# Patient Record
Sex: Male | Born: 1965 | Race: Black or African American | Hispanic: No | Marital: Single | Smoking: Never smoker
Health system: Southern US, Community
[De-identification: ages and names within clinical notes are randomized; demographics above are authoritative.]

## PROBLEM LIST (undated history)

## (undated) DIAGNOSIS — N200 Calculus of kidney: Secondary | ICD-10-CM

## (undated) DIAGNOSIS — Z8051 Family history of malignant neoplasm of kidney: Secondary | ICD-10-CM

## (undated) DIAGNOSIS — Z87442 Personal history of urinary calculi: Secondary | ICD-10-CM

## (undated) DIAGNOSIS — J9600 Acute respiratory failure, unspecified whether with hypoxia or hypercapnia: Secondary | ICD-10-CM

## (undated) DIAGNOSIS — E871 Hypo-osmolality and hyponatremia: Secondary | ICD-10-CM

## (undated) DIAGNOSIS — N179 Acute kidney failure, unspecified: Secondary | ICD-10-CM

## (undated) DIAGNOSIS — C801 Malignant (primary) neoplasm, unspecified: Secondary | ICD-10-CM

## (undated) DIAGNOSIS — I10 Essential (primary) hypertension: Secondary | ICD-10-CM

## (undated) DIAGNOSIS — D869 Sarcoidosis, unspecified: Secondary | ICD-10-CM

## (undated) DIAGNOSIS — D649 Anemia, unspecified: Secondary | ICD-10-CM

## (undated) DIAGNOSIS — Z8042 Family history of malignant neoplasm of prostate: Secondary | ICD-10-CM

## (undated) DIAGNOSIS — J189 Pneumonia, unspecified organism: Secondary | ICD-10-CM

## (undated) DIAGNOSIS — Z8041 Family history of malignant neoplasm of ovary: Secondary | ICD-10-CM

## (undated) DIAGNOSIS — Z800 Family history of malignant neoplasm of digestive organs: Secondary | ICD-10-CM

## (undated) HISTORY — PX: OTHER SURGICAL HISTORY: SHX169

## (undated) HISTORY — DX: Family history of malignant neoplasm of ovary: Z80.41

## (undated) HISTORY — PX: HEAD & NECK SKIN LESION EXCISIONAL BIOPSY: SUR472

## (undated) HISTORY — PX: KNEE SURGERY: SHX244

## (undated) HISTORY — DX: Family history of malignant neoplasm of digestive organs: Z80.0

## (undated) HISTORY — DX: Family history of malignant neoplasm of kidney: Z80.51

## (undated) HISTORY — DX: Family history of malignant neoplasm of prostate: Z80.42

---

## 2003-12-11 ENCOUNTER — Encounter: Admission: RE | Admit: 2003-12-11 | Discharge: 2003-12-11 | Payer: Self-pay | Admitting: Internal Medicine

## 2005-07-23 ENCOUNTER — Ambulatory Visit (HOSPITAL_COMMUNITY): Admission: RE | Admit: 2005-07-23 | Discharge: 2005-07-23 | Payer: Self-pay | Admitting: Gastroenterology

## 2005-07-23 ENCOUNTER — Encounter (INDEPENDENT_AMBULATORY_CARE_PROVIDER_SITE_OTHER): Payer: Self-pay | Admitting: *Deleted

## 2007-01-11 ENCOUNTER — Encounter: Admission: RE | Admit: 2007-01-11 | Discharge: 2007-01-11 | Payer: Self-pay | Admitting: Internal Medicine

## 2010-12-04 NOTE — Op Note (Signed)
NAME:  Mike Cruz, Mike Cruz               ACCOUNT NO.:  [id number]   MEDICAL RECORD NO.:  [id number]          PATIENT TYPE:  AMB   LOCATION:  ENDO                         FACILITY:  [facility]   PHYSICIAN:  Bernette Redbird, M.D.   DATE OF BIRTH:  07-15-1966   DATE OF PROCEDURE:  07/23/2005  DATE OF DISCHARGE:                                 OPERATIVE REPORT   PROCEDURE:  Colonoscopy with biopsy.   INDICATIONS:  A 45 year old African-American male with family history of  colon cancer in a second-degree relative (maternal grandmother, probably in  her mid to late 32s).   FINDINGS:  Prominent ileocecal valve, probably just redundant prolapsed  ileal mucosa, biopsied.   PROCEDURE:  The risks of the procedure were reviewed with the patient as  well as the principles of the purpose behind screening, and he provided  written consent.  Sedation was fentanyl 100 mcg and Versed 10 mg IV, without  arrhythmias or desaturation.  I did not really get an exam of the prostate  gland.  The Olympus adult video colonoscope was advanced quite easily around  the colon, turning the patient into the supine position to facilitate  passage.  With the help of as some external abdominal compression, the cecum  was reached and the ileum was entered for a short distance.  The ileum  looked normal, with some degree of lymphoid hyperplasia.   There was prominent mucosa on the ileocecal valve, probably just  representing some prolapsed ileal mucosa, but it was biopsied to make sure  that it was not polypoid tissue arising from the colonic mucosa.  Pullback  was then performed.  The quality of the prep was excellent, and it is felt  that all areas were well seen.   This was a normal examination.  No polyps, cancer, colitis, vascular  malformations or diverticulosis were noted.  I could not retroflex in the  rectum due to a small rectal ampulla, but antegrade viewing and reinspection  of the rectum were unremarkable and  pullout through the anal canal was  unrevealing.   The patient tolerated the procedure well and there no apparent  complications.   IMPRESSION:  Essentially normal colonoscopy apart from redundant mucosa at  the ileocecal valve orifice, probably  a normal variant.   PLAN:  1.  Await pathology results.  2.  Follow-up colonoscopy in five years in view of the family history and      the patient's ethnic status.           ______________________________  Bernette Redbird, M.D.     RB/MEDQ  D:  07/23/2005  T:  07/23/2005  Job:  [id number]   cc:   Candyce Churn, M.D.  Fax: [phone]

## 2013-05-11 ENCOUNTER — Emergency Department: Payer: Self-pay | Admitting: Emergency Medicine

## 2013-05-11 LAB — URINALYSIS, COMPLETE
Bilirubin,UR: NEGATIVE
Glucose,UR: NEGATIVE mg/dL (ref 0–75)
Leukocyte Esterase: NEGATIVE
Specific Gravity: 1.02 (ref 1.003–1.030)

## 2013-07-28 ENCOUNTER — Emergency Department (HOSPITAL_COMMUNITY): Payer: BC Managed Care – PPO

## 2013-07-28 ENCOUNTER — Encounter (HOSPITAL_COMMUNITY): Payer: Self-pay | Admitting: Emergency Medicine

## 2013-07-28 ENCOUNTER — Emergency Department (HOSPITAL_COMMUNITY)
Admission: EM | Admit: 2013-07-28 | Discharge: 2013-07-28 | Disposition: A | Discharge status: Home / Self Care | Payer: BC Managed Care – PPO | Attending: Emergency Medicine | Admitting: Emergency Medicine

## 2013-07-28 DIAGNOSIS — N289 Disorder of kidney and ureter, unspecified: Secondary | ICD-10-CM

## 2013-07-28 DIAGNOSIS — R319 Hematuria, unspecified: Secondary | ICD-10-CM

## 2013-07-28 DIAGNOSIS — D649 Anemia, unspecified: Secondary | ICD-10-CM | POA: Insufficient documentation

## 2013-07-28 DIAGNOSIS — R61 Generalized hyperhidrosis: Secondary | ICD-10-CM | POA: Insufficient documentation

## 2013-07-28 DIAGNOSIS — R42 Dizziness and giddiness: Secondary | ICD-10-CM | POA: Insufficient documentation

## 2013-07-28 DIAGNOSIS — N200 Calculus of kidney: Secondary | ICD-10-CM

## 2013-07-28 DIAGNOSIS — R55 Syncope and collapse: Secondary | ICD-10-CM | POA: Insufficient documentation

## 2013-07-28 HISTORY — DX: Calculus of kidney: N20.0

## 2013-07-28 LAB — CBC WITH DIFFERENTIAL/PLATELET
BASOS ABS: 0 10*3/uL (ref 0.0–0.1)
Basophils Relative: 0 % (ref 0–1)
EOS ABS: 0.1 10*3/uL (ref 0.0–0.7)
EOS PCT: 2 % (ref 0–5)
HEMATOCRIT: 35 % — AB (ref 39.0–52.0)
Hemoglobin: 11.7 g/dL — ABNORMAL LOW (ref 13.0–17.0)
LYMPHS PCT: 22 % (ref 12–46)
Lymphs Abs: 1.5 10*3/uL (ref 0.7–4.0)
MCH: 28.3 pg (ref 26.0–34.0)
MCHC: 33.4 g/dL (ref 30.0–36.0)
MCV: 84.7 fL (ref 78.0–100.0)
MONO ABS: 0.7 10*3/uL (ref 0.1–1.0)
Monocytes Relative: 10 % (ref 3–12)
NEUTROS ABS: 4.4 10*3/uL (ref 1.7–7.7)
Neutrophils Relative %: 66 % (ref 43–77)
Platelets: 148 10*3/uL — ABNORMAL LOW (ref 150–400)
RBC: 4.13 MIL/uL — ABNORMAL LOW (ref 4.22–5.81)
RDW: 13.1 % (ref 11.5–15.5)
WBC: 6.7 10*3/uL (ref 4.0–10.5)

## 2013-07-28 LAB — URINE MICROSCOPIC-ADD ON

## 2013-07-28 LAB — COMPREHENSIVE METABOLIC PANEL
ALBUMIN: 3.8 g/dL (ref 3.5–5.2)
ALK PHOS: 65 U/L (ref 39–117)
ALT: 38 U/L (ref 0–53)
AST: 38 U/L — AB (ref 0–37)
BUN: 17 mg/dL (ref 6–23)
CALCIUM: 9.9 mg/dL (ref 8.4–10.5)
CO2: 27 meq/L (ref 19–32)
CREATININE: 1.74 mg/dL — AB (ref 0.50–1.35)
Chloride: 104 mEq/L (ref 96–112)
GFR calc Af Amer: 51 mL/min — ABNORMAL LOW (ref >90)
GFR calc non Af Amer: 44 mL/min — ABNORMAL LOW (ref >90)
GLUCOSE: 118 mg/dL — AB (ref 70–99)
Potassium: 4.2 mEq/L (ref 3.7–5.3)
Sodium: 141 mEq/L (ref 137–147)
Total Bilirubin: 0.4 mg/dL (ref 0.3–1.2)
Total Protein: 7.4 g/dL (ref 6.0–8.3)

## 2013-07-28 LAB — URINALYSIS, ROUTINE W REFLEX MICROSCOPIC
Bilirubin Urine: NEGATIVE
GLUCOSE, UA: NEGATIVE mg/dL
KETONES UR: NEGATIVE mg/dL
Leukocytes, UA: NEGATIVE
Nitrite: NEGATIVE
PH: 5 (ref 5.0–8.0)
Protein, ur: 30 mg/dL — AB
Specific Gravity, Urine: 1.025 (ref 1.005–1.030)
Urobilinogen, UA: 0.2 mg/dL (ref 0.0–1.0)

## 2013-07-28 LAB — POCT I-STAT TROPONIN I
TROPONIN I, POC: 0.01 ng/mL (ref 0.00–0.08)
TROPONIN I, POC: 0 ng/mL (ref 0.00–0.08)

## 2013-07-28 LAB — CG4 I-STAT (LACTIC ACID): Lactic Acid, Venous: 0.99 mmol/L (ref 0.5–2.2)

## 2013-07-28 MED ORDER — OXYCODONE-ACETAMINOPHEN 5-325 MG PO TABS: 1.0000 | ORAL_TABLET | ORAL | Status: DC | PRN

## 2013-07-28 MED ORDER — SODIUM CHLORIDE 0.9 % IV SOLN
1000.0000 mL | Freq: Once | INTRAVENOUS | Status: AC
  Administered 2013-07-28: 1000 mL via INTRAVENOUS

## 2013-07-28 MED ORDER — SODIUM CHLORIDE 0.9 % IV SOLN
1000.0000 mL | INTRAVENOUS | Status: DC
  Administered 2013-07-28: 1000 mL via INTRAVENOUS

## 2013-07-28 NOTE — ED Provider Notes (Signed)
Reviewed vitals/labs Pt improved We discussed imaging and need for urology followup and also need for outpatient MRI by PCP due to lesion in backon CT imaging  Discussed need to hold BP meds until seen by PCP Pt feels comfortable for d/c home BP 121/69  Pulse 68  Temp(Src) 98.3 F (36.8 C) (Oral)  Resp 18  SpO2 100%   Sharyon Cable, MD 07/28/13 0945

## 2013-07-28 NOTE — Discharge Instructions (Signed)
PLEASE HAVE FOLLOWUP MRI OF YOUR BACK AS WE DISCUSSED AS AN OUTPATIENT TO FURTHER EVALUATE THE LESION WE DISCUSSED PLEASE SEE DR GATES NEXT WEEK AND HOLD YOUR BLOOD PRESSURE MEDS UNTIL SEE AT THAT TIME

## 2013-07-28 NOTE — ED Provider Notes (Signed)
CSN: [id number]     Arrival date & time 07/28/13  0325 History   First MD Initiated Contact with Patient 07/28/13 0448     Chief Complaint  Patient presents with  . Loss of Consciousness   (Consider location/radiation/quality/duration/timing/severity/associated sxs/prior Treatment) Patient is a 48 y.o. male presenting with syncope. The history is provided by the patient and the spouse.  Loss of Consciousness He woke up with an urge to urinate. When he got up to go to the bathroom, he felt a little lightheaded. He went to the bathroom to urinate and as he was returning, he had a syncopal episode. His wife estimates consciousness is about 2 minutes. He was diaphoretic but there is no nausea. He denies chest pain or dyspnea. He is complaining of some mild pain in the suprapubic area which she rated 3/10. Similar pain in the past has been treated for kidney stones.  No past medical history on file. No past surgical history on file. No family history on file. History  Substance Use Topics  . Smoking status: Not on file  . Smokeless tobacco: Not on file  . Alcohol Use: Not on file    Review of Systems  Cardiovascular: Positive for syncope.  All other systems reviewed and are negative.    Allergies  Review of patient's allergies indicates not on file.  Home Medications  No current outpatient prescriptions on file. BP 126/62  Temp(Src) 98.3 F (36.8 C) (Oral)  Resp 14  SpO2 100% Physical Exam  Nursing note and vitals reviewed.  48 year old male, resting comfortably and in no acute distress. Vital signs are normal. Oxygen saturation is 100%, which is normal. Head is normocephalic and atraumatic. PERRLA, EOMI. Oropharynx is clear. Neck is nontender and supple without adenopathy or JVD. Back is nontender and there is no CVA tenderness. Lungs are clear without rales, wheezes, or rhonchi. Chest is nontender. Heart has regular rate and rhythm without murmur. Abdomen is soft, flat,  nontender without masses or hepatosplenomegaly and peristalsis is normoactive. Extremities have no cyanosis or edema, full range of motion is present. Skin is warm and dry without rash. Neurologic: Mental status is normal, cranial nerves are intact, there are no motor or sensory deficits.  ED Course  Procedures (including critical care time) Labs Review Results for orders placed during the hospital encounter of 07/28/13  CBC WITH DIFFERENTIAL      Result Value Range   WBC 6.7  4.0 - 10.5 K/uL   RBC 4.13 (*) 4.22 - 5.81 MIL/uL   Hemoglobin 11.7 (*) 13.0 - 17.0 g/dL   HCT 35.0 (*) 39.0 - 52.0 %   MCV 84.7  78.0 - 100.0 fL   MCH 28.3  26.0 - 34.0 pg   MCHC 33.4  30.0 - 36.0 g/dL   RDW 13.1  11.5 - 15.5 %   Platelets 148 (*) 150 - 400 K/uL   Neutrophils Relative % 66  43 - 77 %   Neutro Abs 4.4  1.7 - 7.7 K/uL   Lymphocytes Relative 22  12 - 46 %   Lymphs Abs 1.5  0.7 - 4.0 K/uL   Monocytes Relative 10  3 - 12 %   Monocytes Absolute 0.7  0.1 - 1.0 K/uL   Eosinophils Relative 2  0 - 5 %   Eosinophils Absolute 0.1  0.0 - 0.7 K/uL   Basophils Relative 0  0 - 1 %   Basophils Absolute 0.0  0.0 - 0.1 K/uL  COMPREHENSIVE  METABOLIC PANEL      Result Value Range   Sodium 141  137 - 147 mEq/L   Potassium 4.2  3.7 - 5.3 mEq/L   Chloride 104  96 - 112 mEq/L   CO2 27  19 - 32 mEq/L   Glucose, Bld 118 (*) 70 - 99 mg/dL   BUN 17  6 - 23 mg/dL   Creatinine, Ser 1.74 (*) 0.50 - 1.35 mg/dL   Calcium 9.9  8.4 - 10.5 mg/dL   Total Protein 7.4  6.0 - 8.3 g/dL   Albumin 3.8  3.5 - 5.2 g/dL   AST 38 (*) 0 - 37 U/L   ALT 38  0 - 53 U/L   Alkaline Phosphatase 65  39 - 117 U/L   Total Bilirubin 0.4  0.3 - 1.2 mg/dL   GFR calc non Af Amer 44 (*) >90 mL/min   GFR calc Af Amer 51 (*) >90 mL/min  URINALYSIS, ROUTINE W REFLEX MICROSCOPIC      Result Value Range   Color, Urine YELLOW  YELLOW   APPearance CLOUDY (*) CLEAR   Specific Gravity, Urine 1.025  1.005 - 1.030   pH 5.0  5.0 - 8.0   Glucose,  UA NEGATIVE  NEGATIVE mg/dL   Hgb urine dipstick LARGE (*) NEGATIVE   Bilirubin Urine NEGATIVE  NEGATIVE   Ketones, ur NEGATIVE  NEGATIVE mg/dL   Protein, ur 30 (*) NEGATIVE mg/dL   Urobilinogen, UA 0.2  0.0 - 1.0 mg/dL   Nitrite NEGATIVE  NEGATIVE   Leukocytes, UA NEGATIVE  NEGATIVE  URINE MICROSCOPIC-ADD ON      Result Value Range   Squamous Epithelial / LPF FEW (*) RARE   RBC / HPF TOO NUMEROUS TO COUNT  <3 RBC/hpf   Bacteria, UA RARE  RARE   Casts HYALINE CASTS (*) NEGATIVE   Crystals CA OXALATE CRYSTALS (*) NEGATIVE  POCT I-STAT TROPONIN I      Result Value Range   Troponin i, poc 0.01  0.00 - 0.08 ng/mL   Comment 3           CG4 I-STAT (LACTIC ACID)      Result Value Range   Lactic Acid, Venous 0.99  0.5 - 2.2 mmol/L   EKG Interpretation    Date/Time:  Saturday July 28 2013 03:29:32 EST Ventricular Rate:  65 PR Interval:  169 QRS Duration: 93 QT Interval:  433 QTC Calculation: 450 R Axis:   62 Text Interpretation:  Sinus rhythm Abnormal R-wave progression, early transition Early repolarization, probably normal variant No old tracing to compare Confirmed by [facility]  MD, Lleyton Byers ([id number]) on 07/28/2013 4:49:42 AM            MDM   1. Syncope   2. Anemia   3. Renal insufficiency   4. Hematuria    Syncopal episode of which may be orthostatic. Suprapubic pain of uncertain cause. There is no tenderness on exam and no CVA tenderness. Urinalysis will be obtained to look for evidence of possible ureterolithiasis and he will be sent for CT of urinalysis is at all suspicious. In the meantime, he is given IV fluids and orthostatic vital signs will be checked.  Orthostatic vital signs are unremarkable, but he did receive some IV fluid prior to them being checked.  Creatinine has come back elevated and patient is not aware of having had renal insufficiency. Hemoglobin has come back low at is not aware of any history of anemia. Urinalysis does have significant  hematuria, so CT  will be obtained of abdomen and pelvis to evaluate for possible kidney stone. Case signed out to Dr. Christy Gentles. Anticipate he should be able to be discharged with close followup by his PCP.  Delora Fuel, MD 24/23/53 [id number]

## 2013-07-28 NOTE — ED Notes (Addendum)
Patient arrives via EMS with complaint of LOC. Endorses taking hydrocodone last night around 2330 for knee pain. Woke around 0200 to go to the bathroom and felt somewhat dizzy with ambulation. While voiding felt pain similar to previous kidney stone experience and lost consciousness. Hit wall loudly and wife came to investigate. Patient was initially responsive and coherent. Wife left room for a short period of time to retrieve some water. Upon returning patient was found unresponsive and diaphoretic. Upon waking speech was incoherent for a couple minutes, but returned to baseline quickly. Patient ambulated from ambulance to [place] without assistance. Reports feeling some left flank pain a week or two ago. Pain is now felt suprapubic on the right side.

## 2015-10-02 ENCOUNTER — Other Ambulatory Visit: Payer: Self-pay | Admitting: Urology

## 2015-10-15 ENCOUNTER — Encounter (HOSPITAL_COMMUNITY): Payer: Self-pay | Admitting: *Deleted

## 2015-10-15 NOTE — H&P (Signed)
Reason For Visit Flank pain   History of Present Illness He is complaining of right flank pain radiating into the right lower back since Sunday. He also notes a change in the odor of his urine. No increasing frequency or urgency. No hematuria or dysuria. He rates his pain is mild to moderate in intensity and severity. He remains afebrile without other signs symptoms of infection.   Past Medical History Problems  1. History of hypertension (Z86.79) 2. History of renal calculi [id number])  Surgical History Problems  1. History of Knee Arthroscopy (Therapeutic)  Current Meds 1. Multi-Day Vitamins TABS;  Therapy: (Recorded:13Apr2016) to Recorded 2. Tribenzor 40-5-12.5 MG Oral Tablet;  Therapy: (Recorded:28Jan2015) to Recorded  Allergies Medication  1. Penicillins  Family History Problems  1. Family history of Death of family member : Mother, Father 2. Family history of acute myocardial infarction (Z82.49) : Father 3. Family history of colon cancer (Z80.0) : Maternal Grandmother 4. Family history of kidney cancer (Z80.51) : Father 5. Family history of malignant neoplasm of male breast (Z80.3) : Mother 6. Family history of ovarian cancer (Z80.41) : Mother 7. Family history of prostate cancer (Z80.42) : Father  Social History Problems  1. Never smoker 2. Number of children   1 son    2 daughters 3. Occupation   Optometrist 4. Single  Review of Systems  Genitourinary: foul-smelling urine.  Gastrointestinal: flank pain.  Musculoskeletal: back pain.    Vitals Vital Signs [Data Includes: Last 1 Day]  Recorded: [id number] 01:50PM  Blood Pressure: 138 / 81 Temperature: 97.8 F Heart Rate: 83  Physical Exam Constitutional: Well nourished and well developed . No acute distress.  ENT:. The ears and nose are normal in appearance.  Neck: The appearance of the neck is normal and no neck mass is present.  Pulmonary: No respiratory distress and normal respiratory rhythm and  effort.  Cardiovascular: Heart rate and rhythm are normal . No peripheral edema.  Abdomen: The abdomen is soft and nontender. No masses are palpated. No CVA tenderness. No hernias are palpable. No hepatosplenomegaly noted.  Skin: Normal skin turgor, no visible rash and no visible skin lesions.  Neuro/Psych:. Mood and affect are appropriate.    Results/Data Urine [Data Includes: Last 1 Day]   [id number]  COLOR YELLOW   APPEARANCE CLEAR   SPECIFIC GRAVITY 1.025   pH 5.5   GLUCOSE NEGATIVE   BILIRUBIN NEGATIVE   KETONE NEGATIVE   BLOOD 3+   PROTEIN NEGATIVE   NITRITE NEGATIVE   LEUKOCYTE ESTERASE NEGATIVE   SQUAMOUS EPITHELIAL/HPF 0-5 HPF  WBC 0-5 WBC/HPF  RBC >60 RBC/HPF  BACTERIA NONE SEEN HPF  CRYSTALS NONE SEEN HPF  CASTS NONE SEEN LPF  Yeast NONE SEEN HPF   The following images/tracing/specimen were independently visualized:  There is a previously noted right lower pole renal calculi on KUB performed last April. This calculus appears to have moved on today's examination and is mid-polar with the possibility of being in the renal collecting system. Large stable right sided pelvic phlebolith noted unchanged from previous exam. A drew an arrow on in the pacs system.  The following clinical lab reports were reviewed:  UA with >60 rbc/hpf. Sent for c/s.    Assessment Assessed  1. Renal colic (Q000111Q) 2. Kidney stone on right side (N20.0) 3. Bad odor of urine (R82.90)  Plan Health Maintenance  1. UA With REFLEX; [Do Not Release]; Status:Complete;   [id number] 01:35PM Kidney stone on right side  2. Start: Hydrocodone-Acetaminophen 10-325  MG Oral Tablet; Take 1/2-1 tabet every 4-6  hours for severe pain 3. Start: Promethazine HCl - 25 MG Oral Tablet; TAKE 1 TABLET EVERY 6 HOURS AS  NEEDED FOR NAUSEA 4. Follow-up Schedule Surgery Office  Follow-up  Status: Complete  Done: [id number] Kidney stone on right side, Renal colic  5. Start: Tamsulosin HCl - 0.4 MG Oral Capsule; TAKE 1  CAPSULE Daily Renal colic  6. KUB; Status:Complete;   [id number] 02:13PM  Discussion/Summary Noting and to not be in any acute distress along with the noted size and location of the stone, I feel he would be an excellent candidate for shockwave lithotripsy to provide a definitive treatment for the renal calculus rather than continue to observe or attempt medical expulsion therapy. For lithotripsy I described the risks which include arrhythmia, kidney contusion, kidney hemorrhage, need for transfusion, long-term risk of diabetes or hypertension, back discomfort, flank ecchymosis, flank abrasion, inability to break up stone, inability to pass stone fragments, Steinstrasse, infection associated with obstructing stones, need for different surgical procedure and possible need for repeat shockwave lithotripsy.    I'll provide appropriate analgesia, an alpha-blocker, and antinausea medication. Hydration strategies also discussed. I suggested he use ibuprofen 600-800 mg prior to reverting to hydrocodone for colic pain. He will f/u sooner if symptoms worsen. I will see him in f/u after procedure.     Signatures Electronically signed by : Jiles Crocker, Trey Paula; Oct 01 2015  3:07PM EST  Add: Pt with right flank pain. Right lower pole stone, 9 mm, increasing in size.  Urine culture grew 7000 mixed growth.  Prior culture last year grew Escherichia coli sensitive to Cipro.

## 2015-10-16 ENCOUNTER — Ambulatory Visit (HOSPITAL_COMMUNITY): Payer: BC Managed Care – PPO

## 2015-10-16 ENCOUNTER — Encounter (HOSPITAL_COMMUNITY): Payer: Self-pay | Admitting: *Deleted

## 2015-10-16 ENCOUNTER — Ambulatory Visit (HOSPITAL_COMMUNITY)
Admission: RE | Admit: 2015-10-16 | Discharge: 2015-10-16 | Disposition: A | Discharge status: Home / Self Care | Payer: BC Managed Care – PPO | Source: Ambulatory Visit | Attending: Urology | Admitting: Urology

## 2015-10-16 ENCOUNTER — Encounter (HOSPITAL_COMMUNITY)
Admission: RE | Disposition: A | Discharge status: Home / Self Care | Payer: Self-pay | Source: Ambulatory Visit | Attending: Urology

## 2015-10-16 DIAGNOSIS — Z79899 Other long term (current) drug therapy: Secondary | ICD-10-CM | POA: Insufficient documentation

## 2015-10-16 DIAGNOSIS — I10 Essential (primary) hypertension: Secondary | ICD-10-CM | POA: Diagnosis not present

## 2015-10-16 DIAGNOSIS — N200 Calculus of kidney: Secondary | ICD-10-CM | POA: Insufficient documentation

## 2015-10-16 DIAGNOSIS — Z87442 Personal history of urinary calculi: Secondary | ICD-10-CM | POA: Insufficient documentation

## 2015-10-16 SURGERY — LITHOTRIPSY, ESWL
Anesthesia: LOCAL | Laterality: Right

## 2015-10-16 MED ORDER — CIPROFLOXACIN HCL 500 MG PO TABS
500.0000 mg | ORAL_TABLET | ORAL | Status: AC
  Administered 2015-10-16: 500 mg via ORAL
  Filled 2015-10-16: qty 1

## 2015-10-16 MED ORDER — DIPHENHYDRAMINE HCL 25 MG PO CAPS
25.0000 mg | ORAL_CAPSULE | ORAL | Status: AC
  Administered 2015-10-16: 25 mg via ORAL
  Filled 2015-10-16: qty 1

## 2015-10-16 MED ORDER — SODIUM CHLORIDE 0.9 % IV SOLN
INTRAVENOUS | Status: DC
  Administered 2015-10-16: 09:00:00 via INTRAVENOUS

## 2015-10-16 MED ORDER — DIAZEPAM 5 MG PO TABS
10.0000 mg | ORAL_TABLET | ORAL | Status: AC
  Administered 2015-10-16: 10 mg via ORAL
  Filled 2015-10-16: qty 2

## 2015-10-16 NOTE — Op Note (Signed)
Right ESWL -- 14 mm RLP stone  Findings: faded/fragmented well

## 2015-10-16 NOTE — Interval H&P Note (Signed)
History and Physical Interval Note:  10/16/2015 10:21 AM  Mike Cruz  has presented today for surgery, with the diagnosis of RIGHT RNEAL CALCULI   The various methods of treatment have been discussed with the patient and family. After consideration of risks, benefits and other options for treatment, the patient has consented to  Procedure(s): RIGHT EXTRACORPOREAL SHOCK WAVE LITHOTRIPSY (ESWL) (Right) as a surgical intervention .  The patient's history has been reviewed, patient examined, no change in status, stable for surgery.  I have reviewed the patient's chart and labs. KUB 14 MM RLP stone. I discussed with the patient the nature, potential benefits, risks and alternatives to Right ESWL, including side effects of the proposed treatment, the likelihood of the patient achieving the goals of the procedure, and any potential problems that might occur during the procedure or recuperation. All questions answered. Patient elects to proceed. He has been well. No dysuria, fever.      Imre Vecchione

## 2015-10-16 NOTE — Discharge Instructions (Signed)
Lithotripsy, Care After °Refer to this sheet in the next few weeks. These instructions provide you with information on caring for yourself after your procedure. Your health care provider may also give you more specific instructions. Your treatment has been planned according to current medical practices, but problems sometimes occur. Call your health care provider if you have any problems or questions after your procedure. °WHAT TO EXPECT AFTER THE PROCEDURE  °· Your urine may have a red tinge for a few days after treatment. Blood loss is usually minimal. °· You may have soreness in the back or flank area. This usually goes away after a few days. The procedure can cause blotches or bruises on the back where the pressure wave enters the skin. These marks usually cause only minimal discomfort and should disappear in a short time. °· Stone fragments should begin to pass within 24 hours of treatment. However, a delayed passage is not unusual. °· You may have pain, discomfort, and feel sick to your stomach (nauseated) when the crushed fragments of stone are passed down the tube from the kidney to the bladder. Stone fragments can pass soon after the procedure and may last for up to 4-8 weeks. °· A small number of patients may have severe pain when stone fragments are not able to pass, which leads to an obstruction. °· If your stone is greater than 1 inch (2.5 cm) in diameter or if you have multiple stones that have a combined diameter greater than 1 inch (2.5 cm), you may require more than one treatment. °· If you had a stent placed prior to your procedure, you may experience some discomfort, especially during urination. You may experience the pain or discomfort in your flank or back, or you may experience a sharp pain or discomfort at the base of your penis or in your lower abdomen. The discomfort usually lasts only a few minutes after urinating. °HOME CARE INSTRUCTIONS  °· Rest at home until you feel your energy  improving. °· Only take over-the-counter or prescription medicines for pain, discomfort, or fever as directed by your health care provider. Depending on the type of lithotripsy, you may need to take antibiotics and anti-inflammatory medicines for a few days. °· Drink enough water and fluids to keep your urine clear or pale yellow. This helps "flush" your kidneys. It helps pass any remaining pieces of stone and prevents stones from coming back. °· Most people can resume daily activities within 1-2 days after standard lithotripsy. It can take longer to recover from laser and percutaneous lithotripsy. °· Strain all urine through the provided strainer. Keep all particulate matter and stones for your health care provider to see. The stone may be as small as a grain of salt. It is very important to use the strainer each and every time you pass your urine. Any stones that are found can be sent to a medical lab for examination. °· Visit your health care provider for a follow-up appointment in a few weeks. Your doctor may remove your stent if you have one. Your health care provider will also check to see whether stone particles still remain. °SEEK MEDICAL CARE IF:  °· Your pain is not relieved by medicine. °· You have a lasting nauseous feeling. °· You feel there is too much blood in the urine. °· You develop persistent problems with frequent or painful urination that does not at least partially improve after 2 days following the procedure. °· You have a congested cough. °· You feel   lightheaded. °· You develop a rash or any other signs that might suggest an allergic problem. °· You develop any reaction or side effects to your medicine(s). °SEEK IMMEDIATE MEDICAL CARE IF:  °· You experience severe back or flank pain or both. °· You see nothing but blood when you urinate. °· You cannot pass any urine at all. °· You have a fever or shaking chills. °· You develop shortness of breath, difficulty breathing, or chest pain. °· You  develop vomiting that will not stop after 6-8 hours. °· You have a fainting episode. °  °This information is not intended to replace advice given to you by your health care provider. Make sure you discuss any questions you have with your health care provider. °  °Document Released: 07/25/2007 Document Revised: 03/26/2015 Document Reviewed: 01/18/2013 °Elsevier Interactive Patient Education ©2016 [employer] ° °

## 2016-03-01 ENCOUNTER — Other Ambulatory Visit: Payer: Self-pay | Admitting: Internal Medicine

## 2016-03-01 ENCOUNTER — Ambulatory Visit
Admission: RE | Admit: 2016-03-01 | Discharge: 2016-03-01 | Disposition: A | Discharge status: Home / Self Care | Payer: BC Managed Care – PPO | Source: Ambulatory Visit | Attending: Internal Medicine | Admitting: Internal Medicine

## 2016-03-01 DIAGNOSIS — R590 Localized enlarged lymph nodes: Secondary | ICD-10-CM

## 2016-03-03 ENCOUNTER — Other Ambulatory Visit: Payer: Self-pay | Admitting: Internal Medicine

## 2016-03-03 DIAGNOSIS — R590 Localized enlarged lymph nodes: Secondary | ICD-10-CM

## 2016-03-11 ENCOUNTER — Ambulatory Visit
Admission: RE | Admit: 2016-03-11 | Discharge: 2016-03-11 | Disposition: A | Discharge status: Home / Self Care | Payer: BC Managed Care – PPO | Source: Ambulatory Visit | Attending: Internal Medicine | Admitting: Internal Medicine

## 2016-03-11 DIAGNOSIS — R590 Localized enlarged lymph nodes: Secondary | ICD-10-CM

## 2016-03-11 MED ORDER — IOPAMIDOL (ISOVUE-300) INJECTION 61%
100.0000 mL | Freq: Once | INTRAVENOUS | Status: AC | PRN
  Administered 2016-03-11: 100 mL via INTRAVENOUS

## 2016-03-12 ENCOUNTER — Other Ambulatory Visit: Payer: Self-pay | Admitting: Otolaryngology

## 2016-03-12 ENCOUNTER — Other Ambulatory Visit (HOSPITAL_COMMUNITY)
Admission: RE | Admit: 2016-03-12 | Discharge: 2016-03-12 | Disposition: A | Discharge status: Home / Self Care | Payer: BC Managed Care – PPO | Source: Ambulatory Visit | Attending: Otolaryngology | Admitting: Otolaryngology

## 2016-03-12 DIAGNOSIS — I889 Nonspecific lymphadenitis, unspecified: Secondary | ICD-10-CM | POA: Diagnosis not present

## 2016-03-12 DIAGNOSIS — R590 Localized enlarged lymph nodes: Secondary | ICD-10-CM | POA: Diagnosis present

## 2016-04-27 ENCOUNTER — Ambulatory Visit
Admission: RE | Admit: 2016-04-27 | Discharge: 2016-04-27 | Disposition: A | Discharge status: Home / Self Care | Payer: BC Managed Care – PPO | Source: Ambulatory Visit | Attending: Rheumatology | Admitting: Rheumatology

## 2016-04-27 ENCOUNTER — Other Ambulatory Visit: Payer: Self-pay | Admitting: Rheumatology

## 2016-04-27 DIAGNOSIS — D869 Sarcoidosis, unspecified: Secondary | ICD-10-CM

## 2016-05-31 ENCOUNTER — Ambulatory Visit
Admission: RE | Admit: 2016-05-31 | Discharge: 2016-05-31 | Disposition: A | Discharge status: Home / Self Care | Payer: BC Managed Care – PPO | Source: Ambulatory Visit | Attending: Rheumatology | Admitting: Rheumatology

## 2016-05-31 ENCOUNTER — Other Ambulatory Visit: Payer: Self-pay | Admitting: Rheumatology

## 2016-05-31 DIAGNOSIS — R591 Generalized enlarged lymph nodes: Secondary | ICD-10-CM

## 2016-05-31 DIAGNOSIS — D869 Sarcoidosis, unspecified: Secondary | ICD-10-CM

## 2016-09-23 ENCOUNTER — Ambulatory Visit
Admission: RE | Admit: 2016-09-23 | Discharge: 2016-09-23 | Disposition: A | Discharge status: Home / Self Care | Payer: BC Managed Care – PPO | Source: Ambulatory Visit | Attending: Internal Medicine | Admitting: Internal Medicine

## 2016-09-23 ENCOUNTER — Other Ambulatory Visit: Payer: Self-pay | Admitting: Internal Medicine

## 2016-09-23 DIAGNOSIS — J209 Acute bronchitis, unspecified: Secondary | ICD-10-CM

## 2016-09-27 ENCOUNTER — Emergency Department (HOSPITAL_COMMUNITY): Payer: BC Managed Care – PPO

## 2016-09-27 ENCOUNTER — Encounter (HOSPITAL_COMMUNITY): Payer: Self-pay | Admitting: Emergency Medicine

## 2016-09-27 ENCOUNTER — Inpatient Hospital Stay (HOSPITAL_COMMUNITY): Payer: BC Managed Care – PPO

## 2016-09-27 ENCOUNTER — Inpatient Hospital Stay (HOSPITAL_COMMUNITY)
Admission: EM | Admit: 2016-09-27 | Discharge: 2016-10-03 | DRG: 871 | Disposition: A | Discharge status: Home / Self Care | Payer: BC Managed Care – PPO | Attending: Internal Medicine | Admitting: Internal Medicine

## 2016-09-27 DIAGNOSIS — J129 Viral pneumonia, unspecified: Secondary | ICD-10-CM | POA: Diagnosis present

## 2016-09-27 DIAGNOSIS — D649 Anemia, unspecified: Secondary | ICD-10-CM | POA: Diagnosis present

## 2016-09-27 DIAGNOSIS — R590 Localized enlarged lymph nodes: Secondary | ICD-10-CM

## 2016-09-27 DIAGNOSIS — D631 Anemia in chronic kidney disease: Secondary | ICD-10-CM | POA: Diagnosis not present

## 2016-09-27 DIAGNOSIS — T380X5A Adverse effect of glucocorticoids and synthetic analogues, initial encounter: Secondary | ICD-10-CM | POA: Diagnosis present

## 2016-09-27 DIAGNOSIS — D72829 Elevated white blood cell count, unspecified: Secondary | ICD-10-CM | POA: Diagnosis present

## 2016-09-27 DIAGNOSIS — Z7952 Long term (current) use of systemic steroids: Secondary | ICD-10-CM

## 2016-09-27 DIAGNOSIS — A419 Sepsis, unspecified organism: Principal | ICD-10-CM | POA: Diagnosis present

## 2016-09-27 DIAGNOSIS — Z79899 Other long term (current) drug therapy: Secondary | ICD-10-CM

## 2016-09-27 DIAGNOSIS — D869 Sarcoidosis, unspecified: Secondary | ICD-10-CM | POA: Diagnosis present

## 2016-09-27 DIAGNOSIS — R0600 Dyspnea, unspecified: Secondary | ICD-10-CM

## 2016-09-27 DIAGNOSIS — N189 Chronic kidney disease, unspecified: Secondary | ICD-10-CM | POA: Diagnosis present

## 2016-09-27 DIAGNOSIS — J029 Acute pharyngitis, unspecified: Secondary | ICD-10-CM | POA: Diagnosis present

## 2016-09-27 DIAGNOSIS — N179 Acute kidney failure, unspecified: Secondary | ICD-10-CM | POA: Diagnosis present

## 2016-09-27 DIAGNOSIS — J181 Lobar pneumonia, unspecified organism: Secondary | ICD-10-CM | POA: Diagnosis not present

## 2016-09-27 DIAGNOSIS — I10 Essential (primary) hypertension: Secondary | ICD-10-CM | POA: Diagnosis present

## 2016-09-27 DIAGNOSIS — D638 Anemia in other chronic diseases classified elsewhere: Secondary | ICD-10-CM | POA: Diagnosis present

## 2016-09-27 DIAGNOSIS — R0902 Hypoxemia: Secondary | ICD-10-CM | POA: Diagnosis not present

## 2016-09-27 DIAGNOSIS — J189 Pneumonia, unspecified organism: Secondary | ICD-10-CM | POA: Diagnosis present

## 2016-09-27 DIAGNOSIS — I129 Hypertensive chronic kidney disease with stage 1 through stage 4 chronic kidney disease, or unspecified chronic kidney disease: Secondary | ICD-10-CM | POA: Diagnosis present

## 2016-09-27 DIAGNOSIS — J440 Chronic obstructive pulmonary disease with acute lower respiratory infection: Secondary | ICD-10-CM | POA: Diagnosis present

## 2016-09-27 DIAGNOSIS — R0602 Shortness of breath: Secondary | ICD-10-CM | POA: Diagnosis present

## 2016-09-27 DIAGNOSIS — Z880 Allergy status to penicillin: Secondary | ICD-10-CM

## 2016-09-27 DIAGNOSIS — N183 Chronic kidney disease, stage 3 (moderate): Secondary | ICD-10-CM | POA: Diagnosis present

## 2016-09-27 DIAGNOSIS — B9789 Other viral agents as the cause of diseases classified elsewhere: Secondary | ICD-10-CM | POA: Diagnosis present

## 2016-09-27 DIAGNOSIS — J309 Allergic rhinitis, unspecified: Secondary | ICD-10-CM | POA: Diagnosis present

## 2016-09-27 DIAGNOSIS — J9601 Acute respiratory failure with hypoxia: Secondary | ICD-10-CM | POA: Diagnosis present

## 2016-09-27 DIAGNOSIS — J9600 Acute respiratory failure, unspecified whether with hypoxia or hypercapnia: Secondary | ICD-10-CM | POA: Diagnosis present

## 2016-09-27 DIAGNOSIS — R918 Other nonspecific abnormal finding of lung field: Secondary | ICD-10-CM

## 2016-09-27 DIAGNOSIS — J80 Acute respiratory distress syndrome: Secondary | ICD-10-CM | POA: Diagnosis not present

## 2016-09-27 DIAGNOSIS — N171 Acute kidney failure with acute cortical necrosis: Secondary | ICD-10-CM | POA: Diagnosis not present

## 2016-09-27 HISTORY — DX: Anemia, unspecified: D64.9

## 2016-09-27 HISTORY — DX: Sarcoidosis, unspecified: D86.9

## 2016-09-27 HISTORY — DX: Acute kidney failure, unspecified: N17.9

## 2016-09-27 HISTORY — DX: Essential (primary) hypertension: I10

## 2016-09-27 HISTORY — DX: Acute respiratory failure, unspecified whether with hypoxia or hypercapnia: J96.00

## 2016-09-27 HISTORY — DX: Hypo-osmolality and hyponatremia: E87.1

## 2016-09-27 LAB — CBC WITH DIFFERENTIAL/PLATELET
BASOS ABS: 0 10*3/uL (ref 0.0–0.1)
BASOS PCT: 0 %
EOS ABS: 0.2 10*3/uL (ref 0.0–0.7)
Eosinophils Relative: 1 %
HCT: 28.3 % — ABNORMAL LOW (ref 39.0–52.0)
HEMOGLOBIN: 9.7 g/dL — AB (ref 13.0–17.0)
LYMPHS PCT: 20 %
Lymphs Abs: 3 10*3/uL (ref 0.7–4.0)
MCH: 30.5 pg (ref 26.0–34.0)
MCHC: 34.3 g/dL (ref 30.0–36.0)
MCV: 89 fL (ref 78.0–100.0)
MONOS PCT: 19 %
Monocytes Absolute: 2.9 10*3/uL — ABNORMAL HIGH (ref 0.1–1.0)
NEUTROS PCT: 60 %
Neutro Abs: 8.9 10*3/uL — ABNORMAL HIGH (ref 1.7–7.7)
PLATELETS: 277 10*3/uL (ref 150–400)
RBC: 3.18 MIL/uL — ABNORMAL LOW (ref 4.22–5.81)
RDW: 13.9 % (ref 11.5–15.5)
WBC: 15 10*3/uL — ABNORMAL HIGH (ref 4.0–10.5)

## 2016-09-27 LAB — BASIC METABOLIC PANEL
ANION GAP: 12 (ref 5–15)
BUN: 34 mg/dL — ABNORMAL HIGH (ref 6–20)
CALCIUM: 8.9 mg/dL (ref 8.9–10.3)
CO2: 20 mmol/L — ABNORMAL LOW (ref 22–32)
Chloride: 99 mmol/L — ABNORMAL LOW (ref 101–111)
Creatinine, Ser: 2.41 mg/dL — ABNORMAL HIGH (ref 0.61–1.24)
GFR, EST AFRICAN AMERICAN: 34 mL/min — AB (ref >60)
GFR, EST NON AFRICAN AMERICAN: 29 mL/min — AB (ref >60)
GLUCOSE: 128 mg/dL — AB (ref 65–99)
POTASSIUM: 3.9 mmol/L (ref 3.5–5.1)
SODIUM: 131 mmol/L — AB (ref 135–145)

## 2016-09-27 LAB — MRSA PCR SCREENING: MRSA by PCR: NEGATIVE

## 2016-09-27 LAB — FOLATE: Folate: 17.2 ng/mL (ref >5.9)

## 2016-09-27 LAB — RESPIRATORY PANEL BY PCR
ADENOVIRUS-RVPPCR: NOT DETECTED
Bordetella pertussis: NOT DETECTED
CHLAMYDOPHILA PNEUMONIAE-RVPPCR: NOT DETECTED
CORONAVIRUS NL63-RVPPCR: NOT DETECTED
CORONAVIRUS OC43-RVPPCR: NOT DETECTED
Coronavirus 229E: NOT DETECTED
Coronavirus HKU1: NOT DETECTED
Influenza A: NOT DETECTED
Influenza B: NOT DETECTED
Metapneumovirus: NOT DETECTED
Mycoplasma pneumoniae: NOT DETECTED
PARAINFLUENZA VIRUS 1-RVPPCR: NOT DETECTED
PARAINFLUENZA VIRUS 3-RVPPCR: NOT DETECTED
Parainfluenza Virus 2: NOT DETECTED
Parainfluenza Virus 4: NOT DETECTED
RHINOVIRUS / ENTEROVIRUS - RVPPCR: DETECTED — AB
Respiratory Syncytial Virus: NOT DETECTED

## 2016-09-27 LAB — I-STAT VENOUS BLOOD GAS, ED
Acid-base deficit: 1 mmol/L (ref 0.0–2.0)
Bicarbonate: 21.9 mmol/L (ref 20.0–28.0)
O2 Saturation: 57 %
PCO2 VEN: 30.8 mmHg — AB (ref 44.0–60.0)
PO2 VEN: 28 mmHg — AB (ref 32.0–45.0)
Patient temperature: 98.6
TCO2: 23 mmol/L (ref 0–100)
pH, Ven: 7.461 — ABNORMAL HIGH (ref 7.250–7.430)

## 2016-09-27 LAB — IRON AND TIBC
Iron: 60 ug/dL (ref 45–182)
SATURATION RATIOS: 35 % (ref 17.9–39.5)
TIBC: 172 ug/dL — ABNORMAL LOW (ref 250–450)
UIBC: 112 ug/dL

## 2016-09-27 LAB — RAPID STREP SCREEN (MED CTR MEBANE ONLY): Streptococcus, Group A Screen (Direct): NEGATIVE

## 2016-09-27 LAB — I-STAT CG4 LACTIC ACID, ED
Lactic Acid, Venous: 2.01 mmol/L (ref 0.5–1.9)
Lactic Acid, Venous: 1.37 mmol/L (ref 0.5–1.9)

## 2016-09-27 LAB — APTT: APTT: 31 s (ref 24–36)

## 2016-09-27 LAB — I-STAT TROPONIN, ED: TROPONIN I, POC: 0.01 ng/mL (ref 0.00–0.08)

## 2016-09-27 LAB — RETICULOCYTES
RBC.: 2.94 MIL/uL — AB (ref 4.22–5.81)
RETIC CT PCT: 4.1 % — AB (ref 0.4–3.1)
Retic Count, Absolute: 120.5 10*3/uL (ref 19.0–186.0)

## 2016-09-27 LAB — PROCALCITONIN: PROCALCITONIN: 1.06 ng/mL

## 2016-09-27 LAB — LACTIC ACID, PLASMA: LACTIC ACID, VENOUS: 1.6 mmol/L (ref 0.5–1.9)

## 2016-09-27 LAB — STREP PNEUMONIAE URINARY ANTIGEN: Strep Pneumo Urinary Antigen: NEGATIVE

## 2016-09-27 LAB — PROTIME-INR
INR: 1.29
PROTHROMBIN TIME: 16.2 s — AB (ref 11.4–15.2)

## 2016-09-27 LAB — INFLUENZA PANEL BY PCR (TYPE A & B)
Influenza A By PCR: NEGATIVE
Influenza B By PCR: NEGATIVE

## 2016-09-27 LAB — BRAIN NATRIURETIC PEPTIDE: B NATRIURETIC PEPTIDE 5: 34.9 pg/mL (ref 0.0–100.0)

## 2016-09-27 LAB — FERRITIN: Ferritin: 969 ng/mL — ABNORMAL HIGH (ref 24–336)

## 2016-09-27 LAB — VITAMIN B12: VITAMIN B 12: 308 pg/mL (ref 180–914)

## 2016-09-27 MED ORDER — FOLIC ACID 1 MG PO TABS
1.0000 mg | ORAL_TABLET | Freq: Every day | ORAL | Status: DC
  Administered 2016-09-27 – 2016-10-03 (×7): 1 mg via ORAL
  Filled 2016-09-27 (×7): qty 1

## 2016-09-27 MED ORDER — ADULT MULTIVITAMIN W/MINERALS CH
1.0000 | ORAL_TABLET | Freq: Every day | ORAL | Status: DC
  Administered 2016-09-27 – 2016-10-03 (×7): 1 via ORAL
  Filled 2016-09-27 (×7): qty 1

## 2016-09-27 MED ORDER — ONDANSETRON HCL 4 MG/2ML IJ SOLN: 4.0000 mg | Freq: Four times a day (QID) | INTRAMUSCULAR | Status: DC | PRN

## 2016-09-27 MED ORDER — DEXTROSE 5 % IV SOLN
2.0000 g | Freq: Once | INTRAVENOUS | Status: AC
  Administered 2016-09-27: 2 g via INTRAVENOUS
  Filled 2016-09-27: qty 2

## 2016-09-27 MED ORDER — VANCOMYCIN HCL 10 G IV SOLR
1500.0000 mg | INTRAVENOUS | Status: DC
  Administered 2016-09-28 – 2016-09-29 (×2): 1500 mg via INTRAVENOUS
  Filled 2016-09-27 (×2): qty 1500

## 2016-09-27 MED ORDER — VANCOMYCIN HCL IN DEXTROSE 1-5 GM/200ML-% IV SOLN
1000.0000 mg | Freq: Once | INTRAVENOUS | Status: AC
  Administered 2016-09-27: 1000 mg via INTRAVENOUS
  Filled 2016-09-27: qty 200

## 2016-09-27 MED ORDER — ENOXAPARIN SODIUM 40 MG/0.4ML ~~LOC~~ SOLN
40.0000 mg | SUBCUTANEOUS | Status: DC
  Administered 2016-09-27 – 2016-09-28 (×2): 40 mg via SUBCUTANEOUS
  Filled 2016-09-27 (×2): qty 0.4

## 2016-09-27 MED ORDER — LEVOFLOXACIN IN D5W 750 MG/150ML IV SOLN
750.0000 mg | INTRAVENOUS | Status: AC
  Administered 2016-09-28 – 2016-10-02 (×3): 750 mg via INTRAVENOUS
  Filled 2016-09-27 (×4): qty 150

## 2016-09-27 MED ORDER — SODIUM CHLORIDE 0.9 % IV BOLUS (SEPSIS)
1000.0000 mL | Freq: Once | INTRAVENOUS | Status: AC
  Administered 2016-09-27: 1000 mL via INTRAVENOUS

## 2016-09-27 MED ORDER — ALBUTEROL SULFATE (2.5 MG/3ML) 0.083% IN NEBU
2.5000 mg | INHALATION_SOLUTION | Freq: Three times a day (TID) | RESPIRATORY_TRACT | Status: DC
  Administered 2016-09-28 – 2016-09-29 (×4): 2.5 mg via RESPIRATORY_TRACT
  Filled 2016-09-27 (×4): qty 3

## 2016-09-27 MED ORDER — HYDROCODONE-ACETAMINOPHEN 5-325 MG PO TABS: 1.0000 | ORAL_TABLET | ORAL | Status: DC | PRN

## 2016-09-27 MED ORDER — LEVOFLOXACIN IN D5W 750 MG/150ML IV SOLN: 750.0000 mg | Freq: Once | INTRAVENOUS | Status: DC

## 2016-09-27 MED ORDER — PREDNISONE 50 MG PO TABS: 60.0000 mg | ORAL_TABLET | Freq: Four times a day (QID) | ORAL | Status: DC

## 2016-09-27 MED ORDER — ALBUTEROL SULFATE (2.5 MG/3ML) 0.083% IN NEBU
2.5000 mg | INHALATION_SOLUTION | RESPIRATORY_TRACT | Status: DC
Start: 2016-09-27 — End: 2016-09-27
  Administered 2016-09-27 (×4): 2.5 mg via RESPIRATORY_TRACT
  Filled 2016-09-27 (×4): qty 3

## 2016-09-27 MED ORDER — PREDNISONE 50 MG PO TABS
50.0000 mg | ORAL_TABLET | Freq: Two times a day (BID) | ORAL | Status: DC
  Administered 2016-09-27: 50 mg via ORAL
  Filled 2016-09-27: qty 1

## 2016-09-27 MED ORDER — ALENDRONATE SODIUM 70 MG PO TABS: 70.0000 mg | ORAL_TABLET | ORAL | Status: DC

## 2016-09-27 MED ORDER — FUROSEMIDE 10 MG/ML IJ SOLN
40.0000 mg | Freq: Once | INTRAMUSCULAR | Status: AC
  Administered 2016-09-27: 40 mg via INTRAVENOUS
  Filled 2016-09-27: qty 4

## 2016-09-27 MED ORDER — DOCUSATE SODIUM 100 MG PO CAPS
100.0000 mg | ORAL_CAPSULE | Freq: Two times a day (BID) | ORAL | Status: DC
  Administered 2016-09-27 – 2016-10-03 (×13): 100 mg via ORAL
  Filled 2016-09-27 (×13): qty 1

## 2016-09-27 MED ORDER — IPRATROPIUM-ALBUTEROL 0.5-2.5 (3) MG/3ML IN SOLN
3.0000 mL | Freq: Once | RESPIRATORY_TRACT | Status: AC
  Administered 2016-09-27: 3 mL via RESPIRATORY_TRACT
  Filled 2016-09-27 (×2): qty 3

## 2016-09-27 MED ORDER — ACETAMINOPHEN 325 MG PO TABS: 650.0000 mg | ORAL_TABLET | Freq: Four times a day (QID) | ORAL | Status: DC | PRN

## 2016-09-27 MED ORDER — SODIUM CHLORIDE 0.9% FLUSH
3.0000 mL | Freq: Two times a day (BID) | INTRAVENOUS | Status: DC
  Administered 2016-09-27 – 2016-10-03 (×11): 3 mL via INTRAVENOUS

## 2016-09-27 MED ORDER — METHOTREXATE 2.5 MG PO TABS: 10.0000 mg | ORAL_TABLET | ORAL | Status: DC

## 2016-09-27 MED ORDER — ONDANSETRON HCL 4 MG PO TABS: 4.0000 mg | ORAL_TABLET | Freq: Four times a day (QID) | ORAL | Status: DC | PRN

## 2016-09-27 MED ORDER — SODIUM CHLORIDE 0.9 % IV SOLN
INTRAVENOUS | Status: DC
  Administered 2016-09-27 – 2016-09-29 (×3): via INTRAVENOUS

## 2016-09-27 MED ORDER — METHYLPREDNISOLONE SODIUM SUCC 125 MG IJ SOLR
60.0000 mg | Freq: Four times a day (QID) | INTRAMUSCULAR | Status: DC
  Administered 2016-09-27 – 2016-09-29 (×8): 60 mg via INTRAVENOUS
  Filled 2016-09-27 (×8): qty 2

## 2016-09-27 MED ORDER — ACETAMINOPHEN 650 MG RE SUPP: 650.0000 mg | Freq: Four times a day (QID) | RECTAL | Status: DC | PRN

## 2016-09-27 MED ORDER — METHYLPREDNISOLONE SODIUM SUCC 125 MG IJ SOLR: 60.0000 mg | Freq: Four times a day (QID) | INTRAMUSCULAR | Status: DC

## 2016-09-27 MED ORDER — ALBUTEROL SULFATE (2.5 MG/3ML) 0.083% IN NEBU: 5.0000 mg | INHALATION_SOLUTION | Freq: Once | RESPIRATORY_TRACT | Status: DC

## 2016-09-27 NOTE — ED Notes (Signed)
Patient returned from xray.

## 2016-09-27 NOTE — Progress Notes (Signed)
Order received to obtain ABG on patient.  After sample processed, noted to be venous sample.  Results given to MD.  No further samples at this time.  Will continue to monitor.    Ref. Range 09/27/2016 07:42  Sample type Unknown VENOUS  pH, Ven Latest Ref Range: 7.250 - 7.430  7.461 (H)  pCO2, Ven Latest Ref Range: 44.0 - 60.0 mmHg 30.8 (L)  pO2, Ven Latest Ref Range: 32.0 - 45.0 mmHg 28.0 (LL)  TCO2 Latest Ref Range: 0 - 100 mmol/L 23  Acid-base deficit Latest Ref Range: 0.0 - 2.0 mmol/L 1.0  Bicarbonate Latest Ref Range: 20.0 - 28.0 mmol/L 21.9  O2 Saturation Latest Units: % 57.0  Patient temperature Unknown 98.6 F  Collection site Unknown RADIAL, [facility] T.Marland KitchenMarland Kitchen

## 2016-09-27 NOTE — H&P (Signed)
History and Physical    Tyrann Donaho DGU:[id number] DOB: August 05, 1965 DOA: 09/27/2016  PCP: Henrine Screws, MD Patient coming from: home  Chief Complaint: sob/generalized weakness  HPI: Mike Cruz is a very pleasant 51 y.o. male with medical history significant for sarcoidosis since in the emergency department with the chief complaint shortness of breath. Initial evaluation reveals acute respiratory failure likely related to community-acquired pneumonia in the setting of sarcoidosis as well as sepsis and acute renal failure.  Information is obtained from the patient. He states worsening shortness of breath gradually over the last 3 weeks. He went to his PCP end of February was given antibiotics told it was "viral". He states initially he improved but the shortness of breath never resolved. Last week his respiratory effort gradually  Worsen. He went back to his primary care provider was diagnosed with bronchopneumonia and started on Levaquin. Associated symptoms include subjective fever chills nausea no vomiting reactive cough and sore throat. No chest pain palpitations headache dizziness syncope or near-syncope. No abdominal pain dysuria hematuria frequency or urgency. No lower extremity edema. History of asthma or COPD. No sick contacts or recent travel.  ED Course: In the emergency department he's tachycardia, hypoxic with an oxygen saturation level 88% on room air, tachypnea,  leukocytosis, elevated lactic acid. He is provided with 1 L normal saline vancomycin and cefepime as well as nebulizer  Review of Systems: As per HPI otherwise 10 point review of systems negative.   Ambulatory Status: Ambulates independently independent with ADLs no recent falls  Past Medical History:  Diagnosis Date  . Acute renal failure (ARF) ([place])   . Acute respiratory failure ([place])   . Anemia   . Hypertension   . Hyponatremia   . Kidney stone   . PONV (postoperative nausea and vomiting)    remembers  some nausea postop  . Sarcoidosis [facility])     Past Surgical History:  Procedure Laterality Date  . KNEE SURGERY Left     Social History   Social History  . Marital status: Single    Spouse name: N/A  . Number of children: N/A  . Years of education: N/A   Occupational History  . Not on file.   Social History Main Topics  . Smoking status: Never Smoker  . Smokeless tobacco: Not on file  . Alcohol use Yes  . Drug use: No  . Sexual activity: Not on file   Other Topics Concern  . Not on file   Social History Narrative  . 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 Penicillins Hives    Family History  Problem Relation Age of Onset  . Cancer Mother     ovarian  . AAA (abdominal aortic aneurysm) Father   . Cancer Father     prostate and kidney  . Stroke Sister     Prior to Admission medications   Medication Sig Start Date End Date Taking? Authorizing Provider  alendronate (FOSAMAX) 70 MG tablet Take 70 mg by mouth once a week. On Sunday 09/15/16  Yes Historical Provider, MD  folic acid (FOLVITE) 1 MG tablet Take 1 mg by mouth daily. 09/15/16  Yes Historical Provider, MD  levofloxacin (LEVAQUIN) 500 MG tablet Take 500 mg by mouth daily. 09/23/16  Yes Historical Provider, MD  methotrexate (RHEUMATREX) 2.5 MG tablet Take 10 mg by mouth 2 (two) times a week. On  Thursday and Friday 09/15/16  Yes Historical Provider, MD  Multiple Vitamin (MULTIVITAMIN WITH MINERALS) TABS tablet Take 1 tablet by mouth daily.   Yes Historical Provider, MD  Olmesartan-Amlodipine-HCTZ 40-5-12.5 MG TABS Take 1 tablet by mouth daily. 08/28/16  Yes Historical Provider, MD  predniSONE (DELTASONE) 10 MG tablet Take 20 mg by mouth daily. 09/20/16  Yes Historical Provider, MD  oxyCODONE-acetaminophen (PERCOCET/ROXICET) 5-325 MG per tablet Take 1 tablet by mouth every 4 (four) hours as needed for  severe pain. Patient not taking: Reported on 09/27/2016 07/28/13   Ripley Fraise, MD    Physical Exam: Vitals:   09/27/16 0630 09/27/16 0700 09/27/16 0730 09/27/16 0800  BP: 122/69 113/64 119/70 123/63  Pulse: 92 93 95 99  Resp: (!) 31 (!) 39 (!) 36 (!) 29  Temp:      SpO2: 95% (!) 89% 94% 91%     General:  Appears calm somewhat ill but no uncomfortable Eyes:  PERRL, EOMI, normal lids, iris ENT:  grossly normal hearing, lips & tongue, his membranes of his mouth are slightly pale Neck:  no LAD, masses or thyromegaly Cardiovascular:  RRR, no m/r/g. No LE edema. Pedal pulses present and palpable  Respiratory:  Moderate increased work of breathing with conversation. Breath sounds with fair air movement bilateral bases and diminished and mid and upper lobes. Fine crackles bilateral bases no wheeze no rhonchi Abdomen:  soft, ntnd, positive bowel sounds no guarding or rebounding Skin:  no rash or induration seen on limited exam Musculoskeletal:  grossly normal tone BUE/BLE, good ROM, no bony abnormality Psychiatric:  grossly normal mood and affect, speech fluent and appropriate, AOx3 Neurologic:  CN 2-12 grossly intact, moves all extremities in coordinated fashion, sensation intact  Labs on Admission: I have personally reviewed following labs and imaging studies  CBC:  Recent Labs Lab 09/27/16 0457  WBC 15.0*  NEUTROABS 8.9*  HGB 9.7*  HCT 28.3*  MCV 89.0  PLT 021   Basic Metabolic Panel:  Recent Labs Lab 09/27/16 0457  NA 131*  K 3.9  CL 99*  CO2 20*  GLUCOSE 128*  BUN 34*  CREATININE 2.41*  CALCIUM 8.9   GFR: CrCl cannot be calculated (Unknown ideal weight.). Liver Function Tests: No results for input(s): AST, ALT, ALKPHOS, BILITOT, PROT, ALBUMIN in the last 168 hours. No results for input(s): LIPASE, AMYLASE in the last 168 hours. No results for input(s): AMMONIA in the last 168 hours. Coagulation Profile: No results for input(s): INR, PROTIME in the last 168  hours. Cardiac Enzymes: No results for input(s): CKTOTAL, CKMB, CKMBINDEX, TROPONINI in the last 168 hours. BNP (last 3 results) No results for input(s): PROBNP in the last 8760 hours. HbA1C: No results for input(s): HGBA1C in the last 72 hours. CBG: No results for input(s): GLUCAP in the last 168 hours. Lipid Profile: No results for input(s): CHOL, HDL, LDLCALC, TRIG, CHOLHDL, LDLDIRECT in the last 72 hours. Thyroid Function Tests: No results for input(s): TSH, T4TOTAL, FREET4, T3FREE, THYROIDAB in the last 72 hours. Anemia Panel: No results for input(s): VITAMINB12, FOLATE, FERRITIN, TIBC, IRON, RETICCTPCT in the last 72 hours. Urine analysis:    Component Value Date/Time   COLORURINE YELLOW 07/28/2013 0646   APPEARANCEUR CLOUDY (A) 07/28/2013 0646   APPEARANCEUR Clear 05/11/2013 0228   LABSPEC 1.025 07/28/2013 0646   LABSPEC 1.020 05/11/2013 0228   PHURINE 5.0 07/28/2013 0646   GLUCOSEU NEGATIVE 07/28/2013 0646   GLUCOSEU Negative 05/11/2013 0228   HGBUR LARGE (A) 07/28/2013 1155  BILIRUBINUR NEGATIVE 07/28/2013 0646   BILIRUBINUR Negative 05/11/2013 0228   KETONESUR NEGATIVE 07/28/2013 0646   PROTEINUR 30 (A) 07/28/2013 0646   UROBILINOGEN 0.2 07/28/2013 0646   NITRITE NEGATIVE 07/28/2013 0646   LEUKOCYTESUR NEGATIVE 07/28/2013 0646   LEUKOCYTESUR Negative 05/11/2013 0228    Creatinine Clearance: CrCl cannot be calculated (Unknown ideal weight.).  Sepsis Labs: @LABRCNTIP (procalcitonin:4,lacticidven:4) ) Recent Results (from the past 240 hour(s))  Rapid strep screen     Status: None   Collection Time: 09/27/16  5:10 AM  Result Value Ref Range Status   Streptococcus, Group A Screen (Direct) NEGATIVE NEGATIVE Final    Comment: (NOTE) A Rapid Antigen test may result negative if the antigen level in the sample is below the detection level of this test. The FDA has not cleared this test as a stand-alone test therefore the rapid antigen negative result has reflexed  to a Group A Strep culture.      Radiological Exams on Admission: Dg Chest 2 View  Result Date: 09/27/2016 CLINICAL DATA:  Initial evaluation for acute shortness of breath. History of sarcoidosis. EXAM: CHEST  2 VIEW COMPARISON:  Prior radiograph from 09/23/2016. FINDINGS: Cardiac and mediastinal silhouettes are stable in size and contour, and remain within normal limits. Lungs are normally inflated. Scatter bronchitic changes, worsened from previous. Superimposed patchy bibasilar opacities also progressed, suggesting worsening pneumonia. No pulmonary edema or pleural effusion. No pneumothorax. No acute osseous abnormality. IMPRESSION: Interval worsening in diffuse bronchitic changes with worsened bibasilar infiltrates. Electronically Signed   By: Jeannine Boga M.D.   On: 09/27/2016 04:36    EKG: Independently reviewed. Sinus tachycardia Borderline prolonged QT interval  Assessment/Plan Principal Problem:   Acute respiratory failure (HCC) Active Problems:   CAP (community acquired pneumonia)   Sepsis ([place])   Acute renal failure (ARF) (HCC)   Anemia   Sarcoidosis ([place])   Hypertension   #1. Acute respiratory failure likely related to community-acquired pneumonia in the setting of sarcoidosis. Sensation saturation level 88% on room air in the emergency department. Chest x-ray superimposed patchy bibasilar opacities also progressed, suggesting worsening pneumonia. No pulmonary edema or pleural effusion. No pneumothorax. He received oxygen supplementation nebulizers in the emergency department oxygen saturation level 92% on 4 L at point of admission -To step down -Continue oxygen supplementation -Scheduled nebulizers - stress dosed steroids -monitor oxygen saturation level -wean oxygen as able  #2. CAP. Chest x-ray as noted above. He is afebrile with leukocytosis elevated lactic acid tachypnea on admission -Blood cultures -Sputum culture as able -Strep pneumo urine  antigen -anti-biotics per protocol per pharmacy -iv fluids -track lactic acid -follow procalcitonin  3. Sepsis. Related to community-acquired pneumonia. Elevated lactic acid, leukocytosis, tachycardia, tachypnea, hypoxia, acute renal failure. Provided 1 L normal saline in the emergency department. As as vancomycin and cefepime -Follow blood cultures -Follow sputum cultures -Track lactic acid -Follow pro-calcitonin -Liter normal saline bolus -Continue vigorous IV fluids -Antibiotics as noted above -Hold home HCTZ for now -Monitor  #4. Acute renal failure. Creatinine 2.41 on admission. Patient reports his PCP has noted he was "borderline" chronic kidney disease. - IV fluids as noted above -Hold nephrotoxins -Monitor urine output -Recheck in the morning  #5. Anemia. Hemoglobin 9.7 on admission. No history of same. Home meds include methotrexate and steroids -anemia panel -fobt -may benefit OP follow up  6. Hypertension. Fair control in the emergency department. Home medications include hydrochlorothiazide, amlodipine, olmesartan -hold these medications for now -recommend resuming amlodipine as indicated -monitor  7.Sarcoidosis. Chronic  steroids and methotrexate. No pulmonologist. Baseline in no sob -hold methotrexate for now due to # 4 -stress dose prednisone -see above   DVT prophylaxis: lovenox  Code Status: full  Family Communication: none present  Disposition Plan: home  Consults called: none  Admission status: inpatient    Radene Gunning MD [facility]  If 7PM-7AM, please contact night-coverage [email or web address] Password [email or web address]  09/27/2016, 8:20 AM

## 2016-09-27 NOTE — Progress Notes (Addendum)
Pharmacy Antibiotic Note  Hugo Lybrand is a 51 y.o. male admitted on 09/27/2016 with SOB that is slowly worsening. He was started on Levaquin by his outpatient provider last week. Pt noted to have an AKI, SCr up to 2.4. Unclear what his baseline SCr is, but his last SCr from a few years ago was also elevated at 1.7. Mr. Hobbs received cefepime x1 this morning and vancomycin 1 g IV x1. Will give an additional vancomycin 1 g to complete adequate vancomycin load.    Plan: -Levaquin 750 mg IV q48h -Vancomycin 1 g IV x1 to complete load then 1500 mg IV q24h -Monitor renal fx, cultures, VT as needed   Temp (24hrs), Avg:98.1 F (36.7 C), Min:98.1 F (36.7 C), Max:98.1 F (36.7 C)   Recent Labs Lab 09/27/16 0457 09/27/16 0511 09/27/16 0649  WBC 15.0*  --   --   CREATININE 2.41*  --   --   LATICACIDVEN  --  2.01* 1.37    CrCl cannot be calculated (Unknown ideal weight.).    Allergies  Allergen Reactions  . Penicillins Hives    Antimicrobials this admission: 3/12 vancomycin > 3/12 levaquin >  Dose adjustments this admission: N/A  Microbiology results: 3/12 blood cx: 3/12 rapid strep: neg   Thank you for allowing pharmacy to be a part of this patient's care.    Harvel Quale 09/27/2016 8:06 AM

## 2016-09-27 NOTE — ED Triage Notes (Signed)
Pt was seen at PCP on 09/04/16 given an abx. Pt states it did not resolve. Went back to PCP Thursday and was told he had a "touch of pna." Pt states that since Thursday he has been weak, no appetite, and marked shortness of breath. States cannot walk from one room to the next.

## 2016-09-27 NOTE — ED Notes (Signed)
Transported to X-ray

## 2016-09-27 NOTE — ED Notes (Signed)
Attempted report, RN to call back

## 2016-09-27 NOTE — ED Notes (Signed)
Patient placed on 2L of O2

## 2016-09-27 NOTE — ED Notes (Addendum)
Increased O2 to 4L via [place].  Patient complaining of sore throat.

## 2016-09-27 NOTE — ED Notes (Addendum)
Patient with crackles bilat bases, mid lung on left with crackles.  Patient desat to high 80's on O2 via [place] just sitting up in bed.  Patient with sarcoidosis.  Patient given Levaquin PO by PCP.

## 2016-09-27 NOTE — ED Provider Notes (Signed)
TIME SEEN: 4:25 AM  CHIEF COMPLAINT: Shortness of breath  HPI: Patient is a 51 year old male with history of sarcoidosis on prednisone and methotrexate, CKD who presents emergency department with shortness of breath with exertion that is progressively worsening. Patient reports that he started having subjective fevers, chills and productive cough on February 17. Was told that it was likely viral but was prescribed antibiotics. States he improved but never got completely better. He states that on Wednesday 09/22/16 he started feeling worse. Was seen by his primary care physician on 09/23/16 and was diagnosed with left bronchopneumonia and started on Levaquin. States symptoms have gotten progressively worse over that time. Denies history of asthma or COPD. He did have an influenza vaccination this year. No recent international travel. He has been taking his antibiotics. Does not wear oxygen at home.  No history of PE, DVT, exogenous estrogen use, recent fractures, surgery, trauma, hospitalization or prolonged travel. No lower extremity swelling or pain. No calf tenderness. Patient reports some sore throat from coughing.   ROS: See HPI Constitutional: subjective fever  Eyes: no drainage  ENT: no runny nose   Cardiovascular:  no chest pain  Resp:  SOB  GI: no vomiting or diarrhea GU: no dysuria Integumentary: no rash  Allergy: no hives  Musculoskeletal: no leg swelling  Neurological: no slurred speech ROS otherwise negative  PAST MEDICAL HISTORY/PAST SURGICAL HISTORY:  Past Medical History:  Diagnosis Date  . Kidney stone   . PONV (postoperative nausea and vomiting)    remembers some nausea postop    MEDICATIONS:  Prior to Admission medications   Medication Sig Start Date End Date Taking? Authorizing Provider  Multiple Vitamin (MULTIVITAMIN WITH MINERALS) TABS tablet Take 1 tablet by mouth daily.    Historical Provider, MD  Olmesartan-Amlodipine-HCTZ (TRIBENZOR) 20-5-12.5 MG TABS Take 1  tablet by mouth daily.    Historical Provider, MD  oxyCODONE-acetaminophen (PERCOCET/ROXICET) 5-325 MG per tablet Take 1 tablet by mouth every 4 (four) hours as needed for severe pain. 07/28/13   Ripley Fraise, MD  tamsulosin (FLOMAX) 0.4 MG CAPS capsule Take 0.4 mg by mouth.    Historical Provider, MD    ALLERGIES:  Allergies  Allergen Reactions  . Penicillins Hives    SOCIAL HISTORY:  Social History  Substance Use Topics  . Smoking status: Never Smoker  . Smokeless tobacco: Not on file  . Alcohol use Yes    FAMILY HISTORY: No family history on file.  EXAM: BP 148/69   Pulse 101   Temp 98.1 F (36.7 C)   Resp (!) 32   SpO2 (!) 88%  CONSTITUTIONAL: Alert and oriented and responds appropriately to questions. Well-appearing; well-nourished, Nontoxic, afebrile HEAD: Normocephalic EYES: Conjunctivae clear, pupils appear equal, EOMI ENT: normal nose; moist mucous membranes; No pharyngeal erythema or petechiae, no tonsillar hypertrophy or exudate, no uvular deviation, no unilateral swelling, no trismus or drooling, no muffled voice, normal phonation, no stridor, no dental caries present, no drainable dental abscess noted, no Ludwig's angina, tongue sits flat in the bottom of the mouth, no angioedema, no facial erythema or warmth, no facial swelling; no pain with movement of the neck. NECK: Supple, no meningismus, no nuchal rigidity, no LAD  CARD: RRR; S1 and S2 appreciated; no murmurs, no clicks, no rubs, no gallops RESP: Normal chest excursion without splinting or tachypnea; breath sounds clear and equal bilaterally; no wheezes, no rhonchi, no rales, patient is hypoxic on room air and speaking short sentences but not in any respiratory distress  and has no increased work of breathing ABD/GI: Normal bowel sounds; non-distended; soft, non-tender, no rebound, no guarding, no peritoneal signs, no hepatosplenomegaly BACK:  The back appears normal and is non-tender to palpation, there is no  CVA tenderness EXT: Normal ROM in all joints; non-tender to palpation; no edema; normal capillary refill; no cyanosis, no calf tenderness or swelling    SKIN: Normal color for age and race; warm; no rash NEURO: Moves all extremities equally PSYCH: The patient's mood and manner are appropriate. Grooming and personal hygiene are appropriate.  MEDICAL DECISION MAKING: Patient here shortness of breath worse with exertion, subjective fevers, productive cough. He has new oxygen requirement here. Lungs are clear to auscultation with good aeration but chest x-ray shows worsening bibasilar infiltrates compared to prior chest x-ray. He has been on oral Levaquin. Given he is immunocompromised on prednisone and methotrexate, I will broaden his antibiotics to vancomycin and cefepime. Will obtain influenza swab as well. Will obtain labs, cultures the patient will need admission given new oxygen requirement. He is comfortable with this plan. PCP is with [facility] physicians.  ED PROGRESS: 5:50 AM  Patient's labs show leukocytosis of 15,000. Lactate mildly elevated at 2.01. Troponin negative. BNP normal. Doing well on 4 L nasal cannula currently. Not in any distress. Receiving broad-spectrum antibiotics and IV fluids. We'll discuss with hospitalist for admission for worsening pneumonia that is failing outpatient treatment and hypoxia.   6:04 AM Discussed patient's case with hospitalist, Dr. Hal Hope.  I have recommended admission and patient (and family if present) agree with this plan. Admitting physician will place admission orders.   I reviewed all nursing notes, vitals, pertinent previous records, EKGs, lab and urine results, imaging (as available).    EKG Interpretation  Date/Time:  Monday September 27 2016 04:05:40 EDT Ventricular Rate:  103 PR Interval:    QRS Duration: 82 QT Interval:  381 QTC Calculation: 499 R Axis:   56 Text Interpretation:  Sinus tachycardia Borderline prolonged QT interval Confirmed  by Allah Reason,  DO, Dasie Chancellor [phone]) on 09/27/2016 4:13:26 AM         [place], DO 09/27/16 4650

## 2016-09-28 ENCOUNTER — Inpatient Hospital Stay (HOSPITAL_COMMUNITY): Payer: BC Managed Care – PPO

## 2016-09-28 LAB — HIV ANTIBODY (ROUTINE TESTING W REFLEX): HIV SCREEN 4TH GENERATION: NONREACTIVE

## 2016-09-28 LAB — COMPREHENSIVE METABOLIC PANEL
ALBUMIN: 2.7 g/dL — AB (ref 3.5–5.0)
ALT: 22 U/L (ref 17–63)
AST: 29 U/L (ref 15–41)
Alkaline Phosphatase: 51 U/L (ref 38–126)
Anion gap: 9 (ref 5–15)
BILIRUBIN TOTAL: 0.4 mg/dL (ref 0.3–1.2)
BUN: 30 mg/dL — AB (ref 6–20)
CHLORIDE: 106 mmol/L (ref 101–111)
CO2: 21 mmol/L — ABNORMAL LOW (ref 22–32)
CREATININE: 2.19 mg/dL — AB (ref 0.61–1.24)
Calcium: 8.9 mg/dL (ref 8.9–10.3)
GFR calc Af Amer: 38 mL/min — ABNORMAL LOW (ref >60)
GFR, EST NON AFRICAN AMERICAN: 33 mL/min — AB (ref >60)
GLUCOSE: 148 mg/dL — AB (ref 65–99)
POTASSIUM: 4.4 mmol/L (ref 3.5–5.1)
Sodium: 136 mmol/L (ref 135–145)
Total Protein: 6.2 g/dL — ABNORMAL LOW (ref 6.5–8.1)

## 2016-09-28 LAB — CBC
HEMATOCRIT: 27.2 % — AB (ref 39.0–52.0)
Hemoglobin: 9 g/dL — ABNORMAL LOW (ref 13.0–17.0)
MCH: 30.1 pg (ref 26.0–34.0)
MCHC: 33.1 g/dL (ref 30.0–36.0)
MCV: 91 fL (ref 78.0–100.0)
PLATELETS: 296 10*3/uL (ref 150–400)
RBC: 2.99 MIL/uL — ABNORMAL LOW (ref 4.22–5.81)
RDW: 14 % (ref 11.5–15.5)
WBC: 16.2 10*3/uL — ABNORMAL HIGH (ref 4.0–10.5)

## 2016-09-28 MED ORDER — LORATADINE 10 MG PO TABS
10.0000 mg | ORAL_TABLET | Freq: Every day | ORAL | Status: DC
  Administered 2016-09-28 – 2016-10-03 (×6): 10 mg via ORAL
  Filled 2016-09-28 (×6): qty 1

## 2016-09-28 MED ORDER — LEVALBUTEROL TARTRATE 45 MCG/ACT IN AERO: 1.0000 | INHALATION_SPRAY | Freq: Four times a day (QID) | RESPIRATORY_TRACT | Status: DC | PRN

## 2016-09-28 MED ORDER — LEVALBUTEROL HCL 0.63 MG/3ML IN NEBU
0.6300 mg | INHALATION_SOLUTION | Freq: Four times a day (QID) | RESPIRATORY_TRACT | Status: DC | PRN
Start: 2016-09-28 — End: 2016-09-29

## 2016-09-28 MED ORDER — HEPARIN SODIUM (PORCINE) 5000 UNIT/ML IJ SOLN
5000.0000 [IU] | Freq: Three times a day (TID) | INTRAMUSCULAR | Status: DC
  Administered 2016-09-28 – 2016-10-02 (×13): 5000 [IU] via SUBCUTANEOUS
  Filled 2016-09-28 (×14): qty 1

## 2016-09-28 MED ORDER — FLUTICASONE PROPIONATE 50 MCG/ACT NA SUSP
2.0000 | Freq: Every day | NASAL | Status: DC
  Administered 2016-09-28 – 2016-10-03 (×6): 2 via NASAL
  Filled 2016-09-28 (×2): qty 16

## 2016-09-28 NOTE — Progress Notes (Signed)
More O2 humidity provided in the HFNC canister. No distress noted. Pt receiving neb at this time.

## 2016-09-28 NOTE — Progress Notes (Addendum)
PROGRESS NOTE    Mike Cruz  EZM:[id number] DOB: 23-Nov-1965 DOA: 09/27/2016 PCP: Henrine Screws, MD   Brief Narrative:  51 yo male with past medical history of sarcoidosis came to the hospital with complaints of severe shortness of breath. It's thought that this could be related to community-acquired pneumonia versus sarcoidosis flare. Admitted to stepdown unit as he was requiring BiPAP/high flow.   Assessment & Plan:   Principal Problem:   Acute respiratory failure (HCC) Active Problems:   CAP (community acquired pneumonia)   Sepsis ([place])   Acute renal failure (ARF) ([place])   Anemia   Sarcoidosis ([place])   Hypertension   Acute renal failure superimposed on chronic kidney disease ([place])   Lobar pneumonia ([place])   Essential hypertension  Acute respiratory failure with severe hypoxia-slightly improved. Drops to high 70s to 80s on 6L [place].  -Likely due to community-acquired pneumonia in the setting of sarcoidosis -Continue high flow oxygen and being as appropriate. Maintain oxygenation between 88-92 percent -Continue schedule nebulizer and IV steroids. -Supportive care as needed. Mucinex if needed. -Pulmonary toilet. -He is on methotrexate at home for sarcoidosis and follows up with a rheumatologist. -Chest x-ray shows mediastinal fullness likely from adenopathy/pulmonary infiltrates which could be from edema or pneumonia. If his respirations does not improve within the next day or 2, he should get a CT of the chest. His last scan was in 03/11/2016. Hopefully this adenopathy will resolve with this acute episode/pneumonia, if not he may end up needing biopsy in the future to rule out lymphoma. I have discussed the possibility with him.  Community acquired pneumonia -Cultures have been ordered. Follow-up. -Strep is negative -Respirate panel-positive for rhinovirus will provide supportive care. -HIV-nonreactive -Cont Levaquin IV -He does have some outpatient allergic rhinitis. I  will given some Flonase, in order loratadine to help some upper respiratory congestion. Supportive care otherwise.   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 Creatinine down to 2.19 from 2.4. -Avoid nephrotoxic drug -Monitor creatinine Lab Results  Component Value Date   CREATININE 2.19 (H) 09/28/2016   CREATININE 2.41 (H) 09/27/2016   CREATININE 1.74 (H) 07/28/2013     Anemia -of unknown etiology. Could be chronic in nature -Iron studies have been sent. Follow-up iron studies, folate and B12. CBC    Component Value Date/Time   WBC 16.2 (H) 09/28/2016 0309   RBC 2.99 (L) 09/28/2016 0309   HGB 9.0 (L) 09/28/2016 0309   HCT 27.2 (L) 09/28/2016 0309   PLT 296 09/28/2016 0309   MCV 91.0 09/28/2016 0309   MCH 30.1 09/28/2016 0309   MCHC 33.1 09/28/2016 0309   RDW 14.0 09/28/2016 0309   LYMPHSABS 3.0 09/27/2016 0457   MONOABS 2.9 (H) 09/27/2016 0457   EOSABS 0.2 09/27/2016 0457   BASOSABS 0.0 09/27/2016 0457     -Blood pressure is stable at this time, resume home medications when appropriate. He is on Norvasc, ARB, HCTZ.  DVT prophylaxis: Hep SubQ Code Status: Full  Family Communication:  Patient comprehends his condition well  Disposition Plan: TBD. Wean off High flow as tolerated.   Consultants:   None  Procedures:   None  Antimicrobials:   Levaquin 3/13   Subjective: Patient states he feels a little better this morning in terms of his shortness of breath but still remains on high flow as he desaturates into the low 80s if tried to wean him off to nasal cannula. He does report of more nasal stuffiness. He  did urinate quite a bit after getting Lasix yesterday but he does not think that made him feel any better.  Objective: Vitals:   09/28/16 0435 09/28/16 0802 09/28/16 0900 09/28/16 1250  BP: 118/72  (!) 115/58   Pulse: 87  (!) 102   Resp: (!) 36  (!) 42   Temp: 98.3 F (36.8 C)   97.8 F (36.6 C)    TempSrc: Oral   Oral  SpO2: 92% 91% 91%   Weight:      Height:        Intake/Output Summary (Last 24 hours) at 09/28/16 1346 Last data filed at 09/28/16 0945  Gross per 24 hour  Intake          1404.75 ml  Output             1810 ml  Net          -405.25 ml   Filed Weights   09/27/16 1030  Weight: 108.8 kg (239 lb 13.8 oz)    Examination:  General exam: Appears calm and comfortable  Respiratory system: Clear to auscultation. Respiratory effort normal. Cardiovascular system: S1 & S2 heard, RRR. No JVD, murmurs, rubs, gallops or clicks. No pedal edema. Gastrointestinal system: Abdomen is nondistended, soft and nontender. No organomegaly or masses felt. Normal bowel sounds heard. Central nervous system: Alert and oriented. No focal neurological deficits. Extremities: Symmetric 5 x 5 power. Skin: No rashes, lesions or ulcers Psychiatry: Judgement and insight appear normal. Mood & affect appropriate.     Data Reviewed:   CBC:  Recent Labs Lab 09/27/16 0457 09/28/16 0309  WBC 15.0* 16.2*  NEUTROABS 8.9*  --   HGB 9.7* 9.0*  HCT 28.3* 27.2*  MCV 89.0 91.0  PLT 277 778   Basic Metabolic Panel:  Recent Labs Lab 09/27/16 0457 09/28/16 0309  NA 131* 136  K 3.9 4.4  CL 99* 106  CO2 20* 21*  GLUCOSE 128* 148*  BUN 34* 30*  CREATININE 2.41* 2.19*  CALCIUM 8.9 8.9   GFR: Estimated Creatinine Clearance: 47.2 mL/min (by C-G formula based on SCr of 2.19 mg/dL (H)). Liver Function Tests:  Recent Labs Lab 09/28/16 0309  AST 29  ALT 22  ALKPHOS 51  BILITOT 0.4  PROT 6.2*  ALBUMIN 2.7*   No results for input(s): LIPASE, AMYLASE in the last 168 hours. No results for input(s): AMMONIA in the last 168 hours. Coagulation Profile:  Recent Labs Lab 09/27/16 1158  INR 1.29   Cardiac Enzymes: No results for input(s): CKTOTAL, CKMB, CKMBINDEX, TROPONINI in the last 168 hours. BNP (last 3 results) No results for input(s): PROBNP in the last 8760  hours. HbA1C: No results for input(s): HGBA1C in the last 72 hours. CBG: No results for input(s): GLUCAP in the last 168 hours. Lipid Profile: No results for input(s): CHOL, HDL, LDLCALC, TRIG, CHOLHDL, LDLDIRECT in the last 72 hours. Thyroid Function Tests: No results for input(s): TSH, T4TOTAL, FREET4, T3FREE, THYROIDAB in the last 72 hours. Anemia Panel:  Recent Labs  09/27/16 1158  VITAMINB12 308  FOLATE 17.2  FERRITIN 969*  TIBC 172*  IRON 60  RETICCTPCT 4.1*   Sepsis Labs:  Recent Labs Lab 09/27/16 0511 09/27/16 0649 09/27/16 0742 09/27/16 1158  PROCALCITON  --   --  1.06  --   LATICACIDVEN 2.01* 1.37  --  1.6    Recent Results (from the past 240 hour(s))  Rapid strep screen     Status: None   Collection Time:  09/27/16  5:10 AM  Result Value Ref Range Status   Streptococcus, Group A Screen (Direct) NEGATIVE NEGATIVE Final    Comment: (NOTE) A Rapid Antigen test may result negative if the antigen level in the sample is below the detection level of this test. The FDA has not cleared this test as a stand-alone test therefore the rapid antigen negative result has reflexed to a Group A Strep culture.   Culture, group A strep     Status: None (Preliminary result)   Collection Time: 09/27/16  5:10 AM  Result Value Ref Range Status   Specimen Description THROAT  Final   Special Requests NONE Reflexed from [id number]  Final   Culture CULTURE REINCUBATED FOR BETTER GROWTH  Final   Report Status PENDING  Incomplete  Respiratory Panel by PCR     Status: Abnormal   Collection Time: 09/27/16 10:41 AM  Result Value Ref Range Status   Adenovirus NOT DETECTED NOT DETECTED Final   Coronavirus 229E NOT DETECTED NOT DETECTED Final   Coronavirus HKU1 NOT DETECTED NOT DETECTED Final   Coronavirus NL63 NOT DETECTED NOT DETECTED Final   Coronavirus OC43 NOT DETECTED NOT DETECTED Final   Metapneumovirus NOT DETECTED NOT DETECTED Final   Rhinovirus / Enterovirus DETECTED (A) NOT  DETECTED Final   Influenza A NOT DETECTED NOT DETECTED Final   Influenza B NOT DETECTED NOT DETECTED Final   Parainfluenza Virus 1 NOT DETECTED NOT DETECTED Final   Parainfluenza Virus 2 NOT DETECTED NOT DETECTED Final   Parainfluenza Virus 3 NOT DETECTED NOT DETECTED Final   Parainfluenza Virus 4 NOT DETECTED NOT DETECTED Final   Respiratory Syncytial Virus NOT DETECTED NOT DETECTED Final   Bordetella pertussis NOT DETECTED NOT DETECTED Final   Chlamydophila pneumoniae NOT DETECTED NOT DETECTED Final   Mycoplasma pneumoniae NOT DETECTED NOT DETECTED Final  MRSA PCR Screening     Status: None   Collection Time: 09/27/16 10:41 AM  Result Value Ref Range Status   MRSA by PCR NEGATIVE NEGATIVE Final    Comment:        The GeneXpert MRSA Assay (FDA approved for NASAL specimens only), is one component of a comprehensive MRSA colonization surveillance program. It is not intended to diagnose MRSA infection nor to guide or monitor treatment for MRSA infections.          Radiology Studies: Dg Chest 2 View  Result Date: 09/27/2016 CLINICAL DATA:  Initial evaluation for acute shortness of breath. History of sarcoidosis. EXAM: CHEST  2 VIEW COMPARISON:  Prior radiograph from 09/23/2016. FINDINGS: Cardiac and mediastinal silhouettes are stable in size and contour, and remain within normal limits. Lungs are normally inflated. Scatter bronchitic changes, worsened from previous. Superimposed patchy bibasilar opacities also progressed, suggesting worsening pneumonia. No pulmonary edema or pleural effusion. No pneumothorax. No acute osseous abnormality. IMPRESSION: Interval worsening in diffuse bronchitic changes with worsened bibasilar infiltrates. Electronically Signed   By: Jeannine Boga M.D.   On: 09/27/2016 04:36   Dg Chest Port 1 View  Result Date: 09/28/2016 CLINICAL DATA:  Shortness of breath. EXAM: PORTABLE CHEST 1 VIEW COMPARISON:  09/27/2016 .  CT 03/11/2016. FINDINGS:  Mediastinal fullness again noted consistent previously identified adenopathy. Stable cardiomegaly. Diffuse bilateral pulmonary infiltrates consistent with pulmonary edema and/or pneumonia. No pleural effusion or pneumothorax . IMPRESSION: 1. Mediastinal fullness again noted consistent previously identified adenopathy. 2. Stable cardiomegaly. Diffuse bilateral pulmonary infiltrates consistent pulmonary edema and/or pneumonia. No significant change from prior exam. Electronically Signed   By:  [place]   On: 09/28/2016 07:19   Dg Chest Port 1 View  Result Date: 09/27/2016 CLINICAL DATA:  History of sarcoidosis with current shortness of breath EXAM: PORTABLE CHEST 1 VIEW COMPARISON:  Study obtained earlier in the day ; April 27, 2016 FINDINGS: There is airspace consolidation in both lower lobes, slightly progressed from earlier in the day. There is underlying interstitial prominence diffusely. Heart is upper normal in size. Pulmonary vascularity is normal. Prominence in the right peritracheal region may represent adenopathy from prior sarcoidosis. No evident bone lesions. IMPRESSION: Increase in bibasilar airspace consolidation consistent with pneumonia. Underlying interstitial prominence may represent residua from prior sarcoidosis. Question right paratracheal adenopathy, likely from prior sarcoidosis. Electronically Signed   By: Lowella Grip III M.D.   On: 09/27/2016 14:38        Scheduled Meds: . albuterol  2.5 mg Nebulization TID  . docusate sodium  100 mg Oral BID  . enoxaparin (LOVENOX) injection  40 mg Subcutaneous Q24H  . folic acid  1 mg Oral Daily  . levofloxacin (LEVAQUIN) IV  750 mg Intravenous Q48H  . methylPREDNISolone (SOLU-MEDROL) injection  60 mg Intravenous Q6H  . multivitamin with minerals  1 tablet Oral Daily  . sodium chloride flush  3 mL Intravenous Q12H  . vancomycin  1,500 mg Intravenous Q24H   Continuous Infusions: . sodium chloride 10 mL/hr at 09/27/16 1627      LOS: 1 day    Time spent: 35 mins     Ankit Arsenio Loader, MD [facility] Pager [phone]   If 7PM-7AM, please contact night-coverage [email or web address] Password [email or web address] 09/28/2016, 1:46 PM

## 2016-09-29 DIAGNOSIS — J181 Lobar pneumonia, unspecified organism: Secondary | ICD-10-CM

## 2016-09-29 DIAGNOSIS — D631 Anemia in chronic kidney disease: Secondary | ICD-10-CM

## 2016-09-29 DIAGNOSIS — J9601 Acute respiratory failure with hypoxia: Secondary | ICD-10-CM

## 2016-09-29 DIAGNOSIS — N189 Chronic kidney disease, unspecified: Secondary | ICD-10-CM

## 2016-09-29 DIAGNOSIS — J189 Pneumonia, unspecified organism: Secondary | ICD-10-CM

## 2016-09-29 LAB — PROCALCITONIN: PROCALCITONIN: 0.33 ng/mL

## 2016-09-29 LAB — CULTURE, GROUP A STREP (THRC)

## 2016-09-29 MED ORDER — METHYLPREDNISOLONE SODIUM SUCC 125 MG IJ SOLR
60.0000 mg | Freq: Two times a day (BID) | INTRAMUSCULAR | Status: DC
  Administered 2016-09-30: 60 mg via INTRAVENOUS
  Filled 2016-09-29: qty 2

## 2016-09-29 MED ORDER — ALBUTEROL SULFATE (2.5 MG/3ML) 0.083% IN NEBU: 2.5000 mg | INHALATION_SOLUTION | RESPIRATORY_TRACT | Status: DC | PRN

## 2016-09-29 MED ORDER — HYDROCODONE-ACETAMINOPHEN 5-325 MG PO TABS: 1.0000 | ORAL_TABLET | ORAL | Status: DC | PRN

## 2016-09-29 NOTE — Plan of Care (Signed)
Problem: Physical Regulation: Goal: Ability to maintain clinical measurements within normal limits will improve Outcome: Progressing Pt continues to saturate well on 10L HFNC, however when attempts are made to wean him down he is unable to maintain O2 saturation even at rest.

## 2016-09-29 NOTE — Progress Notes (Signed)
[place] TEAM 1 - Stepdown/ICU TEAM  Avik Leoni  MOQ:[id number] DOB: 1966-01-30 DOA: 09/27/2016 PCP: Henrine Screws, MD    Brief Narrative:  51 yo male with history of sarcoidosis who came to the hospital with complaints of severe shortness of breath. He was admitted to stepdown unit as he was requiring BiPAP/high flow O2.  Subjective: Pt states he is feeling better in general.  He feels less sob, but is not yet back to his baseline.  He denies cp, n/v, or abdom pain.    Assessment & Plan:  Acute hypoxic respiratory failure  -due to community-acquired pneumonia + Viral URI in the setting of chronic sarcoidosis -on methotrexate at home for sarcoidosis and follows with a Rheumatologist -wean O2 as able - is not usually on supplemental O2 at home   Mediastinal fullness - LAD -Chest x-ray shows mediastinal fullness likely from adenopathy/pulmonary infiltrates which could be from pneumonia. Last CT chest was 03/11/2016. Hopefully this adenopathy will resolve with this acute episode/pneumonia, if not he may end up needing biopsy in the future to rule out lymphoma. 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 rate (!) 22, height 5\' 8"  (1.727 m), weight 108 kg (238 lb 1.6 oz), SpO2 95 %.  Intake/Output Summary (Last 24 hours) at 09/29/16 1503 Last data filed at 09/29/16 1400  Gross per 24 hour  Intake              841 ml  Output              525 ml  Net              316 ml   Filed Weights   09/27/16 1030 09/29/16 0450  Weight: 108.8 kg (239 lb 13.8 oz) 108 kg (238 lb 1.6 oz)    Examination: General: No acute respiratory distress at rest but requiring high flow [place] O2 Lungs: Bibasilar crackles - no wheezing - good air movement throughout other fields Cardiovascular: Regular rate and rhythm without murmur gallop or rub normal S1 and S2 Abdomen: Nontender, nondistended, soft, bowel sounds positive, no rebound, no ascites, no appreciable mass Extremities: No significant cyanosis, clubbing, or edema bilateral lower extremities  CBC:  Recent Labs Lab 09/27/16 0457 09/28/16 0309  WBC 15.0* 16.2*  NEUTROABS 8.9*  --   HGB 9.7* 9.0*  HCT 28.3* 27.2*  MCV 89.0 91.0  PLT 277 354   Basic Metabolic Panel:  Recent Labs Lab 09/27/16 0457 09/28/16 0309  NA 131* 136  K 3.9 4.4  CL 99* 106  CO2 20* 21*  GLUCOSE 128* 148*  BUN 34* 30*  CREATININE 2.41* 2.19*  CALCIUM 8.9 8.9   GFR: Estimated Creatinine Clearance: 47 mL/min (by C-G formula based on SCr of 2.19 mg/dL (H)).  Liver Function Tests:  Recent Labs Lab 09/28/16 0309  AST 29  ALT 22  ALKPHOS 51  BILITOT 0.4  PROT 6.2*  ALBUMIN 2.7*    Coagulation Profile:  Recent Labs Lab 09/27/16 1158  INR 1.29    Recent Results (from the past 240 hour(s))  Blood culture (routine x 2)     Status: None (Preliminary result)   Collection Time: 09/27/16  4:59 AM  Result Value Ref Range Status   Specimen Description BLOOD LEFT HAND  Final   Special Requests IN PEDIATRIC BOTTLE 3CC  Final    Culture NO GROWTH 2 DAYS  Final   Report Status PENDING  Incomplete  Blood culture (routine x 2)     Status: None (Preliminary result)   Collection Time: 09/27/16  5:05 AM  Result Value Ref Range Status   Specimen Description BLOOD BLOOD RIGHT FOREARM  Final   Special Requests BOTTLES DRAWN AEROBIC AND ANAEROBIC 5CC  Final   Culture NO GROWTH 2 DAYS  Final   Report Status PENDING  Incomplete  Rapid strep screen     Status: None   Collection Time: 09/27/16  5:10 AM  Result Value Ref Range Status   Streptococcus, Group A Screen (Direct) NEGATIVE NEGATIVE Final    Comment: (NOTE) A Rapid Antigen test may result negative if the antigen level in the sample is below the detection level of this test. The FDA has not cleared this test as a stand-alone test therefore the rapid antigen negative result has reflexed to a Group A Strep culture.   Culture, group A strep     Status: None   Collection Time: 09/27/16  5:10 AM  Result Value Ref Range Status   Specimen Description THROAT  Final   Special Requests NONE Reflexed from [id number]  Final   Culture NO GROUP A STREP (S.PYOGENES) ISOLATED  Final   Report Status 09/29/2016 FINAL  Final  Respiratory Panel by PCR     Status: Abnormal   Collection Time: 09/27/16 10:41 AM  Result Value Ref Range Status   Adenovirus NOT DETECTED NOT DETECTED Final   Coronavirus 229E NOT DETECTED NOT DETECTED Final   Coronavirus HKU1 NOT DETECTED NOT DETECTED Final   Coronavirus NL63 NOT DETECTED NOT DETECTED Final   Coronavirus OC43 NOT DETECTED NOT DETECTED Final   Metapneumovirus NOT DETECTED NOT DETECTED Final   Rhinovirus / Enterovirus DETECTED (A) NOT DETECTED Final   Influenza A NOT DETECTED NOT DETECTED Final   Influenza B NOT DETECTED NOT DETECTED Final   Parainfluenza Virus 1 NOT DETECTED NOT DETECTED Final   Parainfluenza Virus 2 NOT DETECTED NOT DETECTED Final   Parainfluenza Virus 3 NOT DETECTED NOT DETECTED Final   Parainfluenza Virus 4 NOT DETECTED  NOT DETECTED Final   Respiratory Syncytial Virus NOT DETECTED NOT DETECTED Final   Bordetella pertussis NOT DETECTED NOT DETECTED Final   Chlamydophila pneumoniae NOT DETECTED NOT DETECTED Final   Mycoplasma pneumoniae NOT DETECTED NOT DETECTED Final  MRSA PCR Screening     Status: None   Collection Time: 09/27/16 10:41 AM  Result Value Ref Range Status   MRSA by PCR NEGATIVE NEGATIVE Final    Comment:        The GeneXpert MRSA Assay (FDA approved for NASAL specimens only), is one component of a comprehensive MRSA  colonization surveillance program. It is not intended to diagnose MRSA infection nor to guide or monitor treatment for MRSA infections.      Scheduled Meds: . albuterol  2.5 mg Nebulization TID  . docusate sodium  100 mg Oral BID  . fluticasone  2 spray Each Nare Daily  . folic acid  1 mg Oral Daily  . heparin subcutaneous  5,000 Units Subcutaneous Q8H  . levofloxacin (LEVAQUIN) IV  750 mg Intravenous Q48H  . loratadine  10 mg Oral Daily  . methylPREDNISolone (SOLU-MEDROL) injection  60 mg Intravenous Q6H  . multivitamin with minerals  1 tablet Oral Daily  . sodium chloride flush  3 mL Intravenous Q12H  . vancomycin  1,500 mg Intravenous Q24H   Continuous Infusions: . sodium chloride 10 mL/hr at 09/29/16 0840     LOS: 2 days   Cherene Altes, MD [facility] Office  [phone] Pager - Text Page per Shea Evans as per below:  On-Call/Text Page:      [email or web address]      password [email or web address]  If 7PM-7AM, please contact night-coverage [email or web address] Password [email or web address] 09/29/2016, 3:03 PM

## 2016-09-29 NOTE — Progress Notes (Signed)
Dr Thereasa Solo fax from Dr Lorin Mercy is in the chart for [facility].

## 2016-09-30 ENCOUNTER — Inpatient Hospital Stay (HOSPITAL_COMMUNITY): Payer: BC Managed Care – PPO

## 2016-09-30 LAB — BASIC METABOLIC PANEL
ANION GAP: 5 (ref 5–15)
BUN: 36 mg/dL — AB (ref 6–20)
CALCIUM: 9.1 mg/dL (ref 8.9–10.3)
CO2: 24 mmol/L (ref 22–32)
Chloride: 107 mmol/L (ref 101–111)
Creatinine, Ser: 2.15 mg/dL — ABNORMAL HIGH (ref 0.61–1.24)
GFR calc Af Amer: 39 mL/min — ABNORMAL LOW (ref >60)
GFR, EST NON AFRICAN AMERICAN: 34 mL/min — AB (ref >60)
Glucose, Bld: 135 mg/dL — ABNORMAL HIGH (ref 65–99)
POTASSIUM: 4.8 mmol/L (ref 3.5–5.1)
Sodium: 136 mmol/L (ref 135–145)

## 2016-09-30 LAB — CBC
HCT: 26.6 % — ABNORMAL LOW (ref 39.0–52.0)
Hemoglobin: 8.6 g/dL — ABNORMAL LOW (ref 13.0–17.0)
MCH: 30.4 pg (ref 26.0–34.0)
MCHC: 32.3 g/dL (ref 30.0–36.0)
MCV: 94 fL (ref 78.0–100.0)
Platelets: 338 10*3/uL (ref 150–400)
RBC: 2.83 MIL/uL — ABNORMAL LOW (ref 4.22–5.81)
RDW: 15.3 % (ref 11.5–15.5)
WBC: 32.2 10*3/uL — ABNORMAL HIGH (ref 4.0–10.5)

## 2016-09-30 MED ORDER — METHYLPREDNISOLONE SODIUM SUCC 40 MG IJ SOLR
40.0000 mg | Freq: Two times a day (BID) | INTRAMUSCULAR | Status: AC
  Administered 2016-09-30 – 2016-10-01 (×3): 40 mg via INTRAVENOUS
  Filled 2016-09-30 (×3): qty 1

## 2016-09-30 NOTE — Care Management Note (Signed)
Case Management Note  Patient Details  Name: Mike Cruz MRN: [id number] Date of Birth: 12-13-1965  Subjective/Objective:    From home alone, presents with  resp failure secondary to CAP  NCM will cont to follow for dc needs.             Action/Plan:   Expected Discharge Date:                  Expected Discharge Plan:  Home/Self Care  In-House Referral:     Discharge planning Services  CM Consult  Post Acute Care Choice:    Choice offered to:     DME Arranged:    DME Agency:     HH Arranged:    HH Agency:     Status of Service:  In process, will continue to follow  If discussed at Long Length of Stay Meetings, dates discussed:    Additional Comments:  Zenon Mayo, RN 09/30/2016, 10:01 PM

## 2016-09-30 NOTE — Progress Notes (Signed)
Called report to [place] spoke to [employer] prior to transfer

## 2016-09-30 NOTE — Progress Notes (Signed)
[facility] PROGRESS NOTE  Mike Cruz NWG:[id number] DOB: [birth year] DOA: 09/27/2016 PCP: Henrine Screws, MD  51 yo malewith history of ckd, sarcoidosis, recent outpatient treatment for possible pneumonia who came to the hospital with complaints of severe shortness of breath. He was admitted to stepdown unit as he was requiring BiPAP/high flow O2.  Subjective: Pt states he is feeling better in general. still hypoxic with ambulation    Assessment & Plan:  Acute hypoxic respiratory failure. due to community-acquired pneumonia + Viral URI in the setting of chronic sarcoidosis. on methotrexate at home for sarcoidosis and follows with a Rheumatologist -taper steroids, cont to wean O2 as able - is not usually on supplemental O2 at home. May need home oxygen. Recommended to f/u with pulmonology as outpatient   -will obtain echo due to DOE, pulmonary pressure   Mediastinal fullness - LAD. Chest x-ray shows mediastinal fullness likely from adenopathy/pulmonary infiltrates which could be from pneumonia. Last CT chest was 03/11/2016. Hopefully this adenopathy will resolve with this acute episode/pneumonia, if not he may end up needing biopsy in the future to rule out lymphoma. -discussed w/ pt in detail - explaining that f/u imaging/CT will be indicated once he has completed tx for his PNA  Community acquired pneumonia + viral pneumonitis. Strep is negative - influenza negative - respiratory virus panel positive for rhinovirus  -HIV-nonreactive. cont empiric abx for CAP. 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 Fe studies most c/w ACD -leukocytosis likely due to high dose steroids. Monitor     Code Status: full Family Communication: d/w patient, his son (indicate person spoken with, relationship, and if by phone, the  number) Disposition Plan: home 24-48 hrs    Consultants:  none  Procedures:  Pend echo   Antibiotics: Cefepime 3/11 Vancomycin 3/11 > 3/14  Levaquin 3/12 >     (indicate start date, and stop date if known)  HPI/Subjective: Alert, no distress. Hypoxic with ambulation   Objective: Vitals:   09/30/16 0343 09/30/16 0827  BP: (!) 119/47 124/66  Pulse: 78 85  Resp: 19 16  Temp: 98.2 F (36.8 C) 98.2 F (36.8 C)    Intake/Output Summary (Last 24 hours) at 09/30/16 1014 Last data filed at 09/30/16 0516  Gross per 24 hour  Intake             1333 ml  Output              300 ml  Net             1033 ml   Filed Weights   09/27/16 1030 09/29/16 0450  Weight: 108.8 kg (239 lb 13.8 oz) 108 kg (238 lb 1.6 oz)    Exam:   General:  No distress   Cardiovascular: s1,s2 rrr  Respiratory: diminished in LL  Abdomen: soft, nt, nd   Musculoskeletal: no leg edema   Data Reviewed: Basic Metabolic Panel:  Recent Labs Lab 09/27/16 0457 09/28/16 0309 09/30/16 0334  NA 131* 136 136  K 3.9 4.4 4.8  CL 99* 106 107  CO2 20* 21* 24  GLUCOSE 128* 148* 135*  BUN 34* 30* 36*  CREATININE 2.41* 2.19* 2.15*  CALCIUM 8.9 8.9 9.1   Liver Function Tests:  Recent Labs Lab 09/28/16 0309  AST 29  ALT 22  ALKPHOS 51  BILITOT 0.4  PROT 6.2*  ALBUMIN 2.7*   No results for input(s): LIPASE, AMYLASE in the last 168 hours. No results for input(s): AMMONIA in the last 168 hours. CBC:  Recent Labs Lab 09/27/16 0457 09/28/16 0309 09/30/16 0334  WBC 15.0* 16.2* 32.2*  NEUTROABS 8.9*  --   --   HGB 9.7* 9.0* 8.6*  HCT 28.3* 27.2* 26.6*  MCV 89.0 91.0 94.0  PLT 277 296 338   Cardiac Enzymes: No results for input(s): CKTOTAL, CKMB, CKMBINDEX, TROPONINI in the last 168 hours. BNP (last 3 results)  Recent Labs  09/27/16 0457  BNP 34.9    ProBNP (last 3 results) No results for input(s): PROBNP in the last 8760 hours.  CBG: No results for input(s): GLUCAP in  the last 168 hours.  Recent Results (from the past 240 hour(s))  Blood culture (routine x 2)     Status: None (Preliminary result)   Collection Time: 09/27/16  4:59 AM  Result Value Ref Range Status   Specimen Description BLOOD LEFT HAND  Final   Special Requests IN PEDIATRIC BOTTLE 3CC  Final   Culture NO GROWTH 2 DAYS  Final   Report Status PENDING  Incomplete  Blood culture (routine x 2)     Status: None (Preliminary result)   Collection Time: 09/27/16  5:05 AM  Result Value Ref Range Status   Specimen Description BLOOD BLOOD RIGHT FOREARM  Final   Special Requests BOTTLES DRAWN AEROBIC AND ANAEROBIC 5CC  Final   Culture NO GROWTH 2 DAYS  Final   Report Status PENDING  Incomplete  Rapid strep screen     Status: None   Collection Time: 09/27/16  5:10 AM  Result Value Ref Range Status   Streptococcus, Group A Screen (Direct) NEGATIVE NEGATIVE Final    Comment: (NOTE) A Rapid Antigen test may result negative if the antigen level in the sample is below the detection level of this test. The FDA has not cleared this test as a stand-alone test therefore the rapid antigen negative result has reflexed to a Group A Strep culture.   Culture, group A strep     Status: None   Collection Time: 09/27/16  5:10 AM  Result Value Ref Range Status   Specimen Description THROAT  Final   Special Requests NONE Reflexed from [id number]  Final   Culture NO GROUP A STREP (S.PYOGENES) ISOLATED  Final   Report Status 09/29/2016 FINAL  Final  Respiratory Panel by PCR     Status: Abnormal   Collection Time: 09/27/16 10:41 AM  Result Value Ref Range Status   Adenovirus NOT DETECTED NOT DETECTED Final   Coronavirus 229E NOT DETECTED NOT DETECTED Final   Coronavirus HKU1 NOT DETECTED NOT DETECTED Final   Coronavirus NL63 NOT DETECTED NOT DETECTED Final   Coronavirus OC43 NOT DETECTED NOT DETECTED Final   Metapneumovirus NOT DETECTED NOT DETECTED Final   Rhinovirus / Enterovirus DETECTED (A) NOT DETECTED  Final   Influenza A NOT DETECTED NOT DETECTED Final   Influenza B NOT DETECTED NOT DETECTED Final   Parainfluenza Virus 1 NOT DETECTED NOT DETECTED Final   Parainfluenza Virus 2 NOT DETECTED NOT DETECTED Final   Parainfluenza Virus 3 NOT DETECTED NOT DETECTED Final   Parainfluenza Virus 4 NOT DETECTED NOT DETECTED Final   Respiratory Syncytial Virus NOT DETECTED NOT DETECTED Final   Bordetella pertussis NOT DETECTED NOT DETECTED Final   Chlamydophila pneumoniae NOT DETECTED NOT DETECTED Final   Mycoplasma pneumoniae NOT DETECTED NOT DETECTED Final  MRSA PCR  Screening     Status: None   Collection Time: 09/27/16 10:41 AM  Result Value Ref Range Status   MRSA by PCR NEGATIVE NEGATIVE Final    Comment:        The GeneXpert MRSA Assay (FDA approved for NASAL specimens only), is one component of a comprehensive MRSA colonization surveillance program. It is not intended to diagnose MRSA infection nor to guide or monitor treatment for MRSA infections.      Studies: No results found.  Scheduled Meds: . docusate sodium  100 mg Oral BID  . fluticasone  2 spray Each Nare Daily  . folic acid  1 mg Oral Daily  . heparin subcutaneous  5,000 Units Subcutaneous Q8H  . levofloxacin (LEVAQUIN) IV  750 mg Intravenous Q48H  . loratadine  10 mg Oral Daily  . methylPREDNISolone (SOLU-MEDROL) injection  60 mg Intravenous Q12H  . multivitamin with minerals  1 tablet Oral Daily  . sodium chloride flush  3 mL Intravenous Q12H   Continuous Infusions:  Principal Problem:   Acute respiratory failure (HCC) Active Problems:   CAP (community acquired pneumonia)   Sepsis ([place])   Acute renal failure (ARF) ([place])   Anemia   Sarcoidosis (HCC)   Hypertension   Acute renal failure superimposed on chronic kidney disease ([place])   Lobar pneumonia ([place])   Essential hypertension    Time spent: >35 minutes     Kinnie Feil  [facility] Pager [phone]. If 7PM-7AM, please contact  night-coverage at [email or web address], password [facility] 09/30/2016, 10:14 AM  LOS: 3 days

## 2016-09-30 NOTE — Progress Notes (Signed)
Attempted to call report twice both RN and charge RN unable to take report at this time

## 2016-09-30 NOTE — Progress Notes (Signed)
Pharmacy Antibiotic Note  Mike Cruz is a 51 y.o. male admitted on 09/27/2016 with pneumonia.  Pharmacy has been consulted for Levaquin dosing.  Note he has CKD and his levaquin dose is adjusted appropriately.  Plan: Continue Levaquin 750mg  IV q48h Monitor renal function and available micro data  Height: 5\' 8"  (172.7 cm) Weight: 238 lb 1.6 oz (108 kg) IBW/kg (Calculated) : 68.4  Temp (24hrs), Avg:98.1 F (36.7 C), Min:98 F (36.7 C), Max:98.4 F (36.9 C)   Recent Labs Lab 09/27/16 0457 09/27/16 0511 09/27/16 0649 09/27/16 1158 09/28/16 0309 09/30/16 0334  WBC 15.0*  --   --   --  16.2* 32.2*  CREATININE 2.41*  --   --   --  2.19* 2.15*  LATICACIDVEN  --  2.01* 1.37 1.6  --   --     Estimated Creatinine Clearance: 47.9 mL/min (A) (by C-G formula based on SCr of 2.15 mg/dL (H)).    Allergies  Allergen Reactions  . Penicillins Hives      Thank you for allowing pharmacy to be a part of this patient's care.  Legrand Como, Pharm.D., BCPS, AAHIVP Clinical Pharmacist Phone: [phone] or [phone] Pager: [phone] 09/30/2016, 11:18 AM

## 2016-09-30 NOTE — Evaluation (Signed)
Physical Therapy Evaluation Patient Details Name: Mike Cruz MRN: [id number] DOB: [birth year] Today's Date: 09/30/2016   History of Present Illness  Pt is a 51 y/o male admitted secondary to SOB and generalized weakness found to be in acute respiratory failure secondary to CAP. PMH including but not limited to HTN and sarcoidosis.   Clinical Impression  Pt presented sitting EOB with son present when therapist entered room. Prior to admission pt reported that he was independent with all functional mobility and ADLs. Pt ambulated on RA with SPO2 decreasing to as low as 81% x3 requiring standing rest breaks with focus on deep breathing to increase SPO2 to 88-90%. PT reapplied 2L of supplemental O2 at end of session with pt sitting in recliner chair with SPO2 increasing to 95%. All other VSS throughout. PT will continue to follow acutely to ensure a safe d/c home.    Follow Up Recommendations No PT follow up    Equipment Recommendations  None recommended by PT    Recommendations for Other Services       Precautions / Restrictions Precautions Precautions: None Restrictions Weight Bearing Restrictions: No      Mobility  Bed Mobility Overal bed mobility: Modified Independent                Transfers Overall transfer level: Modified independent Equipment used: None             General transfer comment: increased time  Ambulation/Gait Ambulation/Gait assistance: Min guard Ambulation Distance (Feet): 300 Feet Assistive device: None Gait Pattern/deviations: Step-through pattern;Decreased stride length Gait velocity: decreased Gait velocity interpretation: Below normal speed for age/gender General Gait Details: mildly unsteady gait but no LOB or need for physical assistance. PT provided min guard for safety. Pt ambulated on RA with desat as low as 81% x3 requiring standing rest break with focus on deep breathing to allow SPO2 to increase to 88-90%.   Stairs             Wheelchair Mobility    Modified Rankin (Stroke Patients Only)       Balance Overall balance assessment: Needs assistance Sitting-balance support: Feet supported Sitting balance-Leahy Scale: Normal     Standing balance support: During functional activity;No upper extremity supported Standing balance-Leahy Scale: Good                               Pertinent Vitals/Pain Pain Assessment: No/denies pain    Home Living Family/patient expects to be discharged to:: Private residence Living Arrangements: Alone Available Help at Discharge: Family;Available PRN/intermittently   Home Access: Elevator     Home Layout: One level Home Equipment: None      Prior Function Level of Independence: Independent               Hand Dominance        Extremity/Trunk Assessment   Upper Extremity Assessment Upper Extremity Assessment: Overall WFL for tasks assessed    Lower Extremity Assessment Lower Extremity Assessment: Overall WFL for tasks assessed    Cervical / Trunk Assessment Cervical / Trunk Assessment: Normal  Communication   Communication: No difficulties  Cognition Arousal/Alertness: Awake/alert Behavior During Therapy: WFL for tasks assessed/performed Overall Cognitive Status: Within Functional Limits for tasks assessed                      General Comments      Exercises     Assessment/Plan  PT Assessment Patient needs continued PT services  PT Problem List Decreased balance;Decreased mobility;Decreased coordination;Cardiopulmonary status limiting activity       PT Treatment Interventions Gait training;Stair training;Functional mobility training;Therapeutic activities;Therapeutic exercise;Balance training;Neuromuscular re-education;Patient/family education    PT Goals (Current goals can be found in the Care Plan section)  Acute Rehab PT Goals Patient Stated Goal: return home and feel better PT Goal Formulation: With  patient Time For Goal Achievement: 10/14/16 Potential to Achieve Goals: Good    Frequency Min 3X/week   Barriers to discharge        Co-evaluation               End of Session Equipment Utilized During Treatment: Gait belt Activity Tolerance: Patient tolerated treatment well Patient left: in chair;with call bell/phone within reach;with nursing/sitter in room;with family/visitor present;Other (comment) (MD in room) Nurse Communication: Mobility status PT Visit Diagnosis: Unsteadiness on feet (R26.81)         Time: 3646-8032 PT Time Calculation (min) (ACUTE ONLY): 18 min   Charges:   PT Evaluation $PT Eval Moderate Complexity: 1 Procedure     PT G CodesClearnce Sorrel Ketara Cavness 09/30/2016, 9:48 AM Sherie Don, PT, DPT [phone]

## 2016-10-01 ENCOUNTER — Inpatient Hospital Stay (HOSPITAL_COMMUNITY): Payer: BC Managed Care – PPO

## 2016-10-01 DIAGNOSIS — N179 Acute kidney failure, unspecified: Secondary | ICD-10-CM

## 2016-10-01 DIAGNOSIS — N183 Chronic kidney disease, stage 3 (moderate): Secondary | ICD-10-CM

## 2016-10-01 DIAGNOSIS — J80 Acute respiratory distress syndrome: Secondary | ICD-10-CM

## 2016-10-01 DIAGNOSIS — R0902 Hypoxemia: Secondary | ICD-10-CM

## 2016-10-01 DIAGNOSIS — D869 Sarcoidosis, unspecified: Secondary | ICD-10-CM

## 2016-10-01 LAB — ECHOCARDIOGRAM COMPLETE
HEIGHTINCHES: 68 in
WEIGHTICAEL: 3809.55 [oz_av]

## 2016-10-01 LAB — CBC
HCT: 27.4 % — ABNORMAL LOW (ref 39.0–52.0)
Hemoglobin: 8.9 g/dL — ABNORMAL LOW (ref 13.0–17.0)
MCH: 30.4 pg (ref 26.0–34.0)
MCHC: 32.5 g/dL (ref 30.0–36.0)
MCV: 93.5 fL (ref 78.0–100.0)
PLATELETS: 350 10*3/uL (ref 150–400)
RBC: 2.93 MIL/uL — AB (ref 4.22–5.81)
RDW: 15.5 % (ref 11.5–15.5)
WBC: 29.7 10*3/uL — ABNORMAL HIGH (ref 4.0–10.5)

## 2016-10-01 MED ORDER — HYDROCODONE-ACETAMINOPHEN 5-325 MG PO TABS: 1.0000 | ORAL_TABLET | Freq: Four times a day (QID) | ORAL | Status: DC | PRN

## 2016-10-01 MED ORDER — PREDNISONE 20 MG PO TABS
20.0000 mg | ORAL_TABLET | Freq: Two times a day (BID) | ORAL | Status: DC
  Administered 2016-10-02 – 2016-10-03 (×3): 20 mg via ORAL
  Filled 2016-10-01 (×3): qty 1

## 2016-10-01 NOTE — Progress Notes (Signed)
SATURATION QUALIFICATIONS: (This note is used to comply with regulatory documentation for home oxygen)  Patient Saturations on Room Air at Rest = 95%  Patient Saturations on Room Air while Ambulating = 84%  Patient Saturations on 4 Liters of oxygen while Ambulating = 90%  Please briefly explain why patient needs home oxygen: pt 02 was 95% before walking on room air, but dropped to 84% while walking, put him on 4 liters and 02 came up to 90%. Pt back in room on 1 litre and 02 is up to 95%.

## 2016-10-01 NOTE — Progress Notes (Signed)
Physical Therapy Treatment Patient Details Name: Mike Cruz MRN: [id number] DOB: May 10, 1966 Today's Date: 10/01/2016    History of Present Illness Pt is a 51 y/o male admitted secondary to SOB and generalized weakness found to be in acute respiratory failure secondary to CAP. PMH including but not limited to HTN and sarcoidosis.     PT Comments    Pt performed gait with emphasis on energy conservation, pacing and pursed lip breathing.  Pt on RA O2 sats ranged from 87%-90% during ambulation.  During therapeutic exercise sats ranged from 84%-90% on RA.  Pt performed incentive spirometry and improved O2 saturations to 94% on RA.  Pt issued HEP and reviewed frequency/technique and use of incentive spirometer to improve patient respiratory function.      Follow Up Recommendations  No PT follow up     Equipment Recommendations  None recommended by PT    Recommendations for Other Services       Precautions / Restrictions Precautions Precautions: None Restrictions Weight Bearing Restrictions: No    Mobility  Bed Mobility Overal bed mobility: Modified Independent                Transfers Overall transfer level: Modified independent Equipment used: None             General transfer comment: increased time  Ambulation/Gait Ambulation/Gait assistance: Supervision Ambulation Distance (Feet): 220 Feet Assistive device: None Gait Pattern/deviations: Step-through pattern;Decreased stride length Gait velocity: decreased Gait velocity interpretation: Below normal speed for age/gender General Gait Details: Cues for pursed lip breathing and pacing during gait.  Pt with no balance issues but requires multiple standing rest breaks due to desaturations.  87%-90% on RA   Stairs            Wheelchair Mobility    Modified Rankin (Stroke Patients Only)       Balance Overall balance assessment: Needs assistance Sitting-balance support: Feet supported Sitting  balance-Leahy Scale: Normal       Standing balance-Leahy Scale: Good                      Cognition Arousal/Alertness: Awake/alert Behavior During Therapy: WFL for tasks assessed/performed Overall Cognitive Status: Within Functional Limits for tasks assessed                      Exercises General Exercises - Lower Extremity Hip ABduction/ADduction: AROM;Both;10 reps;Supine Hip Flexion/Marching: AROM;Both;10 reps;Supine Heel Raises: AROM;Both;Supine Mini-Sqauts: AROM;Both;10 reps;Supine Other Exercises Other Exercises: Incentive Spirometer: 1x10 reps, 1231ml-1500ml quality.  Cues for frequency.      General Comments        Pertinent Vitals/Pain Pain Assessment: No/denies pain    Home Living                      Prior Function            PT Goals (current goals can now be found in the care plan section) Acute Rehab PT Goals Patient Stated Goal: return home and feel better Potential to Achieve Goals: Good Progress towards PT goals: Progressing toward goals    Frequency    Min 3X/week      PT Plan Current plan remains appropriate    Co-evaluation             End of Session Equipment Utilized During Treatment: Gait belt Activity Tolerance: Patient tolerated treatment well Patient left: in chair;with call bell/phone within reach;with family/visitor present Nurse Communication: Mobility status  PT Visit Diagnosis: Unsteadiness on feet (R26.81)     Time: 8016-5537 PT Time Calculation (min) (ACUTE ONLY): 17 min  Charges:  $Gait Training: 8-22 mins                    G Codes:       Cristela Blue 10-04-2016, 4:34 PM Governor Rooks, PTA pager [phone]

## 2016-10-01 NOTE — Progress Notes (Signed)
  Echocardiogram 2D Echocardiogram has been performed.  Mike Cruz 10/01/2016, 3:18 PM

## 2016-10-01 NOTE — Progress Notes (Signed)
[place] TEAM 1 - Stepdown/ICU TEAM  Mike Cruz  XAJ:[id number] DOB: June 10, 1966 DOA: 09/27/2016 PCP: Henrine Screws, MD    Brief Narrative:  51 yo male with history of sarcoidosis who came to the hospital with complaints of severe shortness of breath. He was admitted to stepdown unit as he was requiring BiPAP/high flow O2.  Subjective: Resting comfortably in a bedside chair.  Still feels sob w/ exertion, but not at rest.  Denies cp, n/v, or abdom pain.    Assessment & Plan:  Acute hypoxic respiratory failure  -due to community-acquired pneumonia + Viral URI in the setting of chronic sarcoidosis -on methotrexate at home for sarcoidosis and follows with a Rheumatologist -wean O2 as able - is not usually on supplemental O2 at home - presently requiring only 1L [place] at rest which is a significant improvement   Mediastinal fullness - LAD -Chest x-ray shows mediastinal fullness likely from adenopathy/pulmonary infiltrates which could be from pneumonia. Last CT chest was 03/11/2016. Hopefully this adenopathy will resolve with this acute episode/pneumonia, if not he may end up needing biopsy in the future to rule out lymphoma. -discussed w/ pt in detail again today - explained that f/u CXR and or CT will be indicated once he has completed tx for his PNA  Community acquired pneumonia + viral pneumonitis  -Strep negative - influenza negative - respiratory virus panel positive for rhinovirus  -HIV nonreactive -cont empiric abx for CAP to complete 7 days of tx and wean O2 as able   Acute kidney injury with underlying CKD stage III -Avoid nephrotoxic drugs - cont to follow crt trend - baseline crt/GFR 1.74/51 as of Jan 2015  Recent Labs Lab 09/27/16 0457 09/28/16 0309 09/30/16 0334  CREATININE 2.41* 2.19* 2.15*   Anemia of unknown etiology Fe studies most c/w ACD c/w his Sarcoid   DVT prophylaxis: SQ heparin  Code Status: FULL CODE Family Communication: spoke w/ pt and son at  bedside  Disposition Plan: tele bed   Consultants:  None   Procedures: none  Antimicrobials:  Cefepime 3/11 Vancomycin 3/11 > 3/14 Levaquin 3/12 >  Objective: Blood pressure 126/71, pulse 66, temperature 98.4 F (36.9 C), temperature source Oral, resp. rate 18, height 5\' 8"  (1.727 m), weight 108 kg (238 lb 1.6 oz), SpO2 95 %.  Intake/Output Summary (Last 24 hours) at 10/01/16 0933 Last data filed at 09/30/16 1300  Gross per 24 hour  Intake              663 ml  Output              900 ml  Net             -237 ml   Filed Weights   09/27/16 1030 09/29/16 0450  Weight: 108.8 kg (239 lb 13.8 oz) 108 kg (238 lb 1.6 oz)    Examination: General: No acute respiratory distress at rest  Lungs: basilar crackles - improved air flow th/o - no wheezing  Cardiovascular: Regular rate and rhythm without murmur  Abdomen: Nontender, nondistended, soft, bowel sounds positive, no rebound Extremities: No significant edema bilateral lower extremities  CBC:  Recent Labs Lab 09/27/16 0457 09/28/16 0309 09/30/16 0334 10/01/16 0732  WBC 15.0* 16.2* 32.2* 29.7*  NEUTROABS 8.9*  --   --   --   HGB 9.7* 9.0* 8.6* 8.9*  HCT 28.3* 27.2* 26.6* 27.4*  MCV 89.0 91.0 94.0 93.5  PLT 277 296 338 094   Basic Metabolic Panel:  Recent  Labs Lab 09/27/16 0457 09/28/16 0309 09/30/16 0334  NA 131* 136 136  K 3.9 4.4 4.8  CL 99* 106 107  CO2 20* 21* 24  GLUCOSE 128* 148* 135*  BUN 34* 30* 36*  CREATININE 2.41* 2.19* 2.15*  CALCIUM 8.9 8.9 9.1   GFR: Estimated Creatinine Clearance: 47.9 mL/min (A) (by C-G formula based on SCr of 2.15 mg/dL (H)).  Liver Function Tests:  Recent Labs Lab 09/28/16 0309  AST 29  ALT 22  ALKPHOS 51  BILITOT 0.4  PROT 6.2*  ALBUMIN 2.7*    Coagulation Profile:  Recent Labs Lab 09/27/16 1158  INR 1.29    Recent Results (from the past 240 hour(s))  Blood culture (routine x 2)     Status: None (Preliminary result)   Collection Time: 09/27/16  4:59  AM  Result Value Ref Range Status   Specimen Description BLOOD LEFT HAND  Final   Special Requests IN PEDIATRIC BOTTLE 3CC  Final   Culture NO GROWTH 3 DAYS  Final   Report Status PENDING  Incomplete  Blood culture (routine x 2)     Status: None (Preliminary result)   Collection Time: 09/27/16  5:05 AM  Result Value Ref Range Status   Specimen Description BLOOD BLOOD RIGHT FOREARM  Final   Special Requests BOTTLES DRAWN AEROBIC AND ANAEROBIC 5CC  Final   Culture NO GROWTH 3 DAYS  Final   Report Status PENDING  Incomplete  Rapid strep screen     Status: None   Collection Time: 09/27/16  5:10 AM  Result Value Ref Range Status   Streptococcus, Group A Screen (Direct) NEGATIVE NEGATIVE Final    Comment: (NOTE) A Rapid Antigen test may result negative if the antigen level in the sample is below the detection level of this test. The FDA has not cleared this test as a stand-alone test therefore the rapid antigen negative result has reflexed to a Group A Strep culture.   Culture, group A strep     Status: None   Collection Time: 09/27/16  5:10 AM  Result Value Ref Range Status   Specimen Description THROAT  Final   Special Requests NONE Reflexed from [id number]  Final   Culture NO GROUP A STREP (S.PYOGENES) ISOLATED  Final   Report Status 09/29/2016 FINAL  Final  Respiratory Panel by PCR     Status: Abnormal   Collection Time: 09/27/16 10:41 AM  Result Value Ref Range Status   Adenovirus NOT DETECTED NOT DETECTED Final   Coronavirus 229E NOT DETECTED NOT DETECTED Final   Coronavirus HKU1 NOT DETECTED NOT DETECTED Final   Coronavirus NL63 NOT DETECTED NOT DETECTED Final   Coronavirus OC43 NOT DETECTED NOT DETECTED Final   Metapneumovirus NOT DETECTED NOT DETECTED Final   Rhinovirus / Enterovirus DETECTED (A) NOT DETECTED Final   Influenza A NOT DETECTED NOT DETECTED Final   Influenza B NOT DETECTED NOT DETECTED Final   Parainfluenza Virus 1 NOT DETECTED NOT DETECTED Final    Parainfluenza Virus 2 NOT DETECTED NOT DETECTED Final   Parainfluenza Virus 3 NOT DETECTED NOT DETECTED Final   Parainfluenza Virus 4 NOT DETECTED NOT DETECTED Final   Respiratory Syncytial Virus NOT DETECTED NOT DETECTED Final   Bordetella pertussis NOT DETECTED NOT DETECTED Final   Chlamydophila pneumoniae NOT DETECTED NOT DETECTED Final   Mycoplasma pneumoniae NOT DETECTED NOT DETECTED Final  MRSA PCR Screening     Status: None   Collection Time: 09/27/16 10:41 AM  Result Value Ref  Range Status   MRSA by PCR NEGATIVE NEGATIVE Final    Comment:        The GeneXpert MRSA Assay (FDA approved for NASAL specimens only), is one component of a comprehensive MRSA colonization surveillance program. It is not intended to diagnose MRSA infection nor to guide or monitor treatment for MRSA infections.      Scheduled Meds: . docusate sodium  100 mg Oral BID  . fluticasone  2 spray Each Nare Daily  . folic acid  1 mg Oral Daily  . heparin subcutaneous  5,000 Units Subcutaneous Q8H  . levofloxacin (LEVAQUIN) IV  750 mg Intravenous Q48H  . loratadine  10 mg Oral Daily  . methylPREDNISolone (SOLU-MEDROL) injection  40 mg Intravenous Q12H  . multivitamin with minerals  1 tablet Oral Daily  . sodium chloride flush  3 mL Intravenous Q12H      LOS: 4 days   Cherene Altes, MD [facility] Office  [phone] Pager - Text Page per Amion as per below:  On-Call/Text Page:      [email or web address]      password [email or web address]  If 7PM-7AM, please contact night-coverage [email or web address] Password [facility] 10/01/2016, 9:33 AM

## 2016-10-02 DIAGNOSIS — N171 Acute kidney failure with acute cortical necrosis: Secondary | ICD-10-CM

## 2016-10-02 LAB — BASIC METABOLIC PANEL
Anion gap: 6 (ref 5–15)
BUN: 35 mg/dL — AB (ref 6–20)
CHLORIDE: 107 mmol/L (ref 101–111)
CO2: 23 mmol/L (ref 22–32)
Calcium: 8.8 mg/dL — ABNORMAL LOW (ref 8.9–10.3)
Creatinine, Ser: 1.78 mg/dL — ABNORMAL HIGH (ref 0.61–1.24)
GFR calc Af Amer: 49 mL/min — ABNORMAL LOW (ref >60)
GFR calc non Af Amer: 42 mL/min — ABNORMAL LOW (ref >60)
GLUCOSE: 187 mg/dL — AB (ref 65–99)
POTASSIUM: 4.1 mmol/L (ref 3.5–5.1)
Sodium: 136 mmol/L (ref 135–145)

## 2016-10-02 LAB — CULTURE, BLOOD (ROUTINE X 2)
Culture: NO GROWTH
Culture: NO GROWTH

## 2016-10-02 NOTE — Progress Notes (Signed)
SATURATION QUALIFICATIONS: (This note is used to comply with regulatory documentation for home oxygen)  Patient Saturations on Room Air at Rest = 92%  Patient Saturations on Room Air while Ambulating = 88%  Patient Saturations on 1 Liters of oxygen while Ambulating = 95%  Please briefly explain why patient needs home oxygen: 

## 2016-10-02 NOTE — Progress Notes (Signed)
[place] TEAM 1 - Stepdown/ICU TEAM  Mike Cruz  WJX:[id number] DOB: Jul 23, 1965 DOA: 09/27/2016 PCP: Mike Screws, MD    Brief Narrative:  52 yo male with history of sarcoidosis who came to the hospital with complaints of severe shortness of breath. He was admitted to stepdown unit as he was requiring BiPAP/high flow O2.  Subjective: The pt has no new complaints.  He denies cp, n/v, or abdom pain.    Assessment & Plan:  Acute hypoxic respiratory failure  -due to community-acquired pneumonia + Viral URI in the setting of chronic sarcoidosis -on methotrexate at home for sarcoidosis and follows with a Rheumatologist -wean O2 as able - is not usually on supplemental O2 at home - with 24hrs additional hospital care I suspect we will be able to liberate him from Oxygen   Mediastinal fullness - LAD -Chest x-ray shows mediastinal fullness likely from adenopathy/pulmonary infiltrates which could be from pneumonia. Last CT chest was 03/11/2016. Hopefully this adenopathy will resolve with this acute episode/pneumonia, if not he may end up needing biopsy in the future to rule out lymphoma. -discussed w/ pt in detail on 2 different occassions - explained that f/u CXR and or CT will be indicated once he has completed tx for his PNA and had enough time to expect improvement (2-3 weeks)  Community acquired pneumonia + viral pneumonitis  -Strep negative - influenza negative - respiratory virus panel positive for rhinovirus  -HIV nonreactive -completed 7 days of inpatient abx tx   Acute kidney injury with underlying CKD stage III -Avoid nephrotoxic drugs - cont to follow crt trend - baseline crt/GFR 1.74/51 as of Jan 2015 - now essentially returned to baseline   Recent Labs Lab 09/27/16 0457 09/28/16 0309 09/30/16 0334 10/02/16 0623  CREATININE 2.41* 2.19* 2.15* 1.78*   Anemia of unknown etiology Fe studies most c/w ACD c/w his Sarcoid   DVT prophylaxis: SQ heparin  Code Status:  FULL CODE Family Communication:  Disposition Plan: tele bed - probable d/c home in AM   Consultants:  None   Procedures: none  Antimicrobials:  Cefepime 3/11 Vancomycin 3/11 > 3/14 Levaquin 3/12 > 3/16  Objective: Blood pressure 131/67, pulse 63, temperature 98 F (36.7 C), temperature source Oral, resp. rate 18, height 5\' 8"  (1.727 m), weight 108 kg (238 lb 1.6 oz), SpO2 92 %.  Intake/Output Summary (Last 24 hours) at 10/02/16 1204 Last data filed at 10/02/16 0940  Gross per 24 hour  Intake              380 ml  Output                0 ml  Net              380 ml   Filed Weights   09/27/16 1030 09/29/16 0450  Weight: 108.8 kg (239 lb 13.8 oz) 108 kg (238 lb 1.6 oz)    Examination: General: No acute respiratory distress at rest  Lungs: basilar crackles w/o wheezing  Cardiovascular: Regular rate and rhythm Abdomen: Nontender, nondistended, soft Extremities: No significant edema B LE  CBC:  Recent Labs Lab 09/27/16 0457 09/28/16 0309 09/30/16 0334 10/01/16 0732  WBC 15.0* 16.2* 32.2* 29.7*  NEUTROABS 8.9*  --   --   --   HGB 9.7* 9.0* 8.6* 8.9*  HCT 28.3* 27.2* 26.6* 27.4*  MCV 89.0 91.0 94.0 93.5  PLT 277 296 338 213   Basic Metabolic Panel:  Recent Labs Lab 09/27/16 0457 09/28/16  0309 09/30/16 0334 10/02/16 0623  NA 131* 136 136 136  K 3.9 4.4 4.8 4.1  CL 99* 106 107 107  CO2 20* 21* 24 23  GLUCOSE 128* 148* 135* 187*  BUN 34* 30* 36* 35*  CREATININE 2.41* 2.19* 2.15* 1.78*  CALCIUM 8.9 8.9 9.1 8.8*   GFR: Estimated Creatinine Clearance: 57.8 mL/min (A) (by C-G formula based on SCr of 1.78 mg/dL (H)).  Liver Function Tests:  Recent Labs Lab 09/28/16 0309  AST 29  ALT 22  ALKPHOS 51  BILITOT 0.4  PROT 6.2*  ALBUMIN 2.7*    Coagulation Profile:  Recent Labs Lab 09/27/16 1158  INR 1.29    Recent Results (from the past 240 hour(s))  Blood culture (routine x 2)     Status: None (Preliminary result)   Collection Time: 09/27/16   4:59 AM  Result Value Ref Range Status   Specimen Description BLOOD LEFT HAND  Final   Special Requests IN PEDIATRIC BOTTLE 3CC  Final   Culture NO GROWTH 4 DAYS  Final   Report Status PENDING  Incomplete  Blood culture (routine x 2)     Status: None (Preliminary result)   Collection Time: 09/27/16  5:05 AM  Result Value Ref Range Status   Specimen Description BLOOD BLOOD RIGHT FOREARM  Final   Special Requests BOTTLES DRAWN AEROBIC AND ANAEROBIC 5CC  Final   Culture NO GROWTH 4 DAYS  Final   Report Status PENDING  Incomplete  Rapid strep screen     Status: None   Collection Time: 09/27/16  5:10 AM  Result Value Ref Range Status   Streptococcus, Group A Screen (Direct) NEGATIVE NEGATIVE Final    Comment: (NOTE) A Rapid Antigen test may result negative if the antigen level in the sample is below the detection level of this test. The FDA has not cleared this test as a stand-alone test therefore the rapid antigen negative result has reflexed to a Group A Strep culture.   Culture, group A strep     Status: None   Collection Time: 09/27/16  5:10 AM  Result Value Ref Range Status   Specimen Description THROAT  Final   Special Requests NONE Reflexed from [id number]  Final   Culture NO GROUP A STREP (S.PYOGENES) ISOLATED  Final   Report Status 09/29/2016 FINAL  Final  Respiratory Panel by PCR     Status: Abnormal   Collection Time: 09/27/16 10:41 AM  Result Value Ref Range Status   Adenovirus NOT DETECTED NOT DETECTED Final   Coronavirus 229E NOT DETECTED NOT DETECTED Final   Coronavirus HKU1 NOT DETECTED NOT DETECTED Final   Coronavirus NL63 NOT DETECTED NOT DETECTED Final   Coronavirus OC43 NOT DETECTED NOT DETECTED Final   Metapneumovirus NOT DETECTED NOT DETECTED Final   Rhinovirus / Enterovirus DETECTED (A) NOT DETECTED Final   Influenza A NOT DETECTED NOT DETECTED Final   Influenza B NOT DETECTED NOT DETECTED Final   Parainfluenza Virus 1 NOT DETECTED NOT DETECTED Final    Parainfluenza Virus 2 NOT DETECTED NOT DETECTED Final   Parainfluenza Virus 3 NOT DETECTED NOT DETECTED Final   Parainfluenza Virus 4 NOT DETECTED NOT DETECTED Final   Respiratory Syncytial Virus NOT DETECTED NOT DETECTED Final   Bordetella pertussis NOT DETECTED NOT DETECTED Final   Chlamydophila pneumoniae NOT DETECTED NOT DETECTED Final   Mycoplasma pneumoniae NOT DETECTED NOT DETECTED Final  MRSA PCR Screening     Status: None   Collection Time: 09/27/16 10:41  AM  Result Value Ref Range Status   MRSA by PCR NEGATIVE NEGATIVE Final    Comment:        The GeneXpert MRSA Assay (FDA approved for NASAL specimens only), is one component of a comprehensive MRSA colonization surveillance program. It is not intended to diagnose MRSA infection nor to guide or monitor treatment for MRSA infections.      Scheduled Meds: . docusate sodium  100 mg Oral BID  . fluticasone  2 spray Each Nare Daily  . folic acid  1 mg Oral Daily  . heparin subcutaneous  5,000 Units Subcutaneous Q8H  . loratadine  10 mg Oral Daily  . multivitamin with minerals  1 tablet Oral Daily  . predniSONE  20 mg Oral BID WC  . sodium chloride flush  3 mL Intravenous Q12H      LOS: 5 days   Cherene Altes, MD [facility] Office  [phone] Pager - Text Page per Shea Evans as per below:  On-Call/Text Page:      [email or web address]      password [email or web address]  If 7PM-7AM, please contact night-coverage [email or web address] Password [facility] 10/02/2016, 12:04 PM

## 2016-10-03 ENCOUNTER — Inpatient Hospital Stay (HOSPITAL_COMMUNITY): Payer: BC Managed Care – PPO

## 2016-10-03 DIAGNOSIS — R590 Localized enlarged lymph nodes: Secondary | ICD-10-CM

## 2016-10-03 LAB — BASIC METABOLIC PANEL
ANION GAP: 8 (ref 5–15)
BUN: 29 mg/dL — ABNORMAL HIGH (ref 6–20)
CO2: 21 mmol/L — ABNORMAL LOW (ref 22–32)
Calcium: 9 mg/dL (ref 8.9–10.3)
Chloride: 109 mmol/L (ref 101–111)
Creatinine, Ser: 1.76 mg/dL — ABNORMAL HIGH (ref 0.61–1.24)
GFR calc Af Amer: 50 mL/min — ABNORMAL LOW (ref >60)
GFR, EST NON AFRICAN AMERICAN: 43 mL/min — AB (ref >60)
Glucose, Bld: 104 mg/dL — ABNORMAL HIGH (ref 65–99)
POTASSIUM: 4.3 mmol/L (ref 3.5–5.1)
SODIUM: 138 mmol/L (ref 135–145)

## 2016-10-03 MED ORDER — PREDNISONE 20 MG PO TABS: 20.0000 mg | ORAL_TABLET | Freq: Every day | ORAL | Status: DC

## 2016-10-03 MED ORDER — PREDNISONE 10 MG PO TABS: 20.0000 mg | ORAL_TABLET | Freq: Every day | ORAL | 0 refills | Status: DC

## 2016-10-03 NOTE — Discharge Instructions (Signed)
Your doctor has recommended that you refrain from work until Monday, October 11, 2016.  You should slowly return to your activities of daily living, keeping in mind that you may continue to feel somewhat short of breath with exertion.  This should slowly improve over the next 7-14 days.     Community-Acquired Pneumonia, Adult  Pneumonia is an infection of the lungs. One type of pneumonia can happen while a person is in a hospital. A different type can happen when a person is not in a hospital (community-acquired pneumonia). It is easy for this kind to spread from person to person. It can spread to you if you breathe near an infected person who coughs or sneezes. Some symptoms include:  A dry cough.  A wet (productive) cough.  Fever.  Sweating.  Chest pain. Follow these instructions at home:  Take over-the-counter and prescription medicines only as told by your doctor.  Only take cough medicine if you are losing sleep.  If you were prescribed an antibiotic medicine, take it as told by your doctor. Do not stop taking the antibiotic even if you start to feel better.  Sleep with your head and neck raised (elevated). You can do this by putting a few pillows under your head, or you can sleep in a recliner.  Do not use tobacco products. These include cigarettes, chewing tobacco, and e-cigarettes. If you need help quitting, ask your doctor.  Drink enough water to keep your pee (urine) clear or pale yellow. A shot (vaccine) can help prevent pneumonia. Shots are often suggested for:  People older than 53 years of age.  People older than 53 years of age:  Who are having cancer treatment.  Who have long-term (chronic) lung disease.  Who have problems with their body's defense system (immune system). You may also prevent pneumonia if you take these actions:  Get the flu (influenza) shot every year.  Go to the dentist as often as told.  Wash your hands often. If soap and water are not  available, use hand sanitizer. Contact a doctor if:  You have a fever.  You lose sleep because your cough medicine does not help. Get help right away if:  You are short of breath and it gets worse.  You have more chest pain.  Your sickness gets worse. This is very serious if:  You are an older adult.  Your body's defense system is weak.  You cough up blood. This information is not intended to replace advice given to you by your health care provider. Make sure you discuss any questions you have with your health care provider. Document Released: 12/22/2007 Document Revised: 12/11/2015 Document Reviewed: 10/30/2014 Elsevier Interactive Patient Education  2017 [employer].  Sarcoidosis Sarcoidosis is a disease that causes inflammation in your organs and other areas of your body. The lungs are most often affected (pulmonary sarcoidosis). Sarcoidosis can also affect your lymph nodes, liver, eyes, skin, or any other body tissue. When you have sarcoidosis, small clumps of tissue (granulomas) form in the affected area of your body. Granulomas are made up of your bodys defense (immune) cells. Inflammation results when your body reacts to a harmful substance. Normally, inflammation goes away after immune cells get rid of the harmful substance. In sarcoidosis, the immune cells form granulomas instead. What are the causes? The exact cause of sarcoidosis is not known. Something triggers the immune system to respond, such as dust, chemicals, bacteria, or a virus. What increases the risk? You may be  at a greater risk for sarcoidosis if you:  Have a family history of the disease.  Are African American.  Are of Northern European ancestry.  Are 8-31 years old.  Are male. What are the signs or symptoms? Many people with sarcoidosis have no symptoms. Others have very mild symptoms. Sarcoidosis most often affects the lungs. Symptoms include:  Chest  pain.  Coughing.  Wheezing.  Shortness of breath. Other common symptoms include:  Night sweats.  Weight loss.  Fatigue.  Depression.  A sense of uneasiness. How is this diagnosed? Sarcoidosis may be diagnosed by:  Medical history and physical exam.  Chest X-ray. This looks for granulomas in your lungs.  Lung function tests. These measure your breathing and look for problems related to sarcoidosis.  Examining a sample of tissue under a microscope (biopsy). How is this treated? Sarcoidosis usually clears up without treatment. You may take medicines to reduce inflammation or relieve symptoms. These may include:  Prednisone. This steroid reduces inflammation related to sarcoidosis.  Chloroquine or hydroxychloroquine. These are antimalarial medicines used to treat sarcoidosis that affects the skin or brain.  Methotrexate, leflunomide, or azathioprine. These medicines affect the immune system and can help with sarcoidosis in the joints, eyes, skin, or lungs.  Inhalers. Inhaled medicines can help you breathe if sarcoidosis is affecting your lungs. Follow these instructions at home:  Do not use any tobacco products, including cigarettes, chewing tobacco, or electronic cigarettes. If you need help quitting, ask your health care provider.  Avoid secondhand smoke.  Avoid irritating dust and chemicals. Stay indoors on days when air quality is poor in your area.  Take medicines only as directed by your health care provider. Contact a health care provider if:  You have vision problems.  You have shortness of breath.  You have a dry, persistent cough.  You have an irregular heartbeat.  You have pain or ache in your joints, hands, or feet.  You have an unexplained rash. Get help right away if: You have chest pain. This information is not intended to replace advice given to you by your health care provider. Make sure you discuss any questions you have with your health  care provider. Document Released: 05/05/2004 Document Revised: 12/11/2015 Document Reviewed: 10/31/2013 Elsevier Interactive Patient Education  2017 [employer].

## 2016-10-03 NOTE — Progress Notes (Signed)
Nsg Discharge Note  Admit Date:  09/27/2016 Discharge date: 10/03/2016   Isabella Stalling to be D/C'd Home per MD order.  AVS completed.  Copy for chart, and copy for patient signed, and dated. Patient/caregiver able to verbalize understanding.  Discharge Medication: Allergies as of 10/03/2016      Reactions   Penicillins Hives      Medication List    STOP taking these medications   levofloxacin 500 MG tablet Commonly known as:  LEVAQUIN   Olmesartan-Amlodipine-HCTZ 40-5-12.5 MG Tabs     TAKE these medications   alendronate 70 MG tablet Commonly known as:  FOSAMAX Take 70 mg by mouth once a week. On Sunday   folic acid 1 MG tablet Commonly known as:  FOLVITE Take 1 mg by mouth daily.   methotrexate 2.5 MG tablet Commonly known as:  RHEUMATREX Take 10 mg by mouth 2 (two) times a week. On Thursday and Friday   multivitamin with minerals Tabs tablet Take 1 tablet by mouth daily.   predniSONE 10 MG tablet Commonly known as:  DELTASONE Take 2 tablets (20 mg total) by mouth daily.       Discharge Assessment: Vitals:   10/02/16 2351 10/03/16 0513  BP: 128/61 124/79  Pulse: 85 69  Resp: 18 17  Temp: 98.9 F (37.2 C) 97.8 F (36.6 C)   Skin clean, dry and intact without evidence of skin break down, no evidence of skin tears noted. IV catheter discontinued intact. Site without signs and symptoms of complications - no redness or edema noted at insertion site, patient denies c/o pain - only slight tenderness at site.  Dressing with slight pressure applied.  D/c Instructions-Education: Discharge instructions given to patient/family with verbalized understanding. D/c education completed with patient/family including follow up instructions, medication list, d/c activities limitations if indicated, with other d/c instructions as indicated by MD - patient able to verbalize understanding, all questions fully answered. Patient instructed to return to ED, call 911, or call MD for  any changes in condition.  Patient escorted via [place], and D/C home via private auto.  Salley Slaughter, RN 10/03/2016 2:08 PM

## 2016-10-03 NOTE — Care Management Note (Signed)
Case Management Note Original Note Created by Tomi Bamberger 09/30/16  Patient Details  Name: Mike Cruz MRN: [id number] Date of Birth: [birth year]  Subjective/Objective:    From home alone, presents with  resp failure secondary to CAP  NCM will cont to follow for dc needs.             Action/Plan:   Expected Discharge Date:  10/03/16               Expected Discharge Plan:  Home/Self Care  In-House Referral:     Discharge planning Services  CM Consult  Post Acute Care Choice:    Choice offered to:     DME Arranged:    DME Agency:     HH Arranged:    HH Agency:     Status of Service:  In process, will continue to follow  If discussed at Long Length of Stay Meetings, dates discussed:    Additional Comments: Pt discharge home today without CM needs.  Supplemental oxygen weaned off prior to discharge Maryclare Labrador, RN 10/03/2016, 11:02 AM

## 2016-10-03 NOTE — Discharge Summary (Addendum)
DISCHARGE SUMMARY  Mike Cruz  MR#: [id number]  DOB:1965/11/28  Date of Admission: 09/27/2016 Date of Discharge: 10/03/2016  Attending Physician:MCCLUNG,JEFFREY T  Patient's Mike Cruz  Consults:  none  Disposition: D/C home   Follow-up Appts: Follow-up Information    Mike Cruz. Schedule an appointment as soon as possible for a visit in 1 week(s).   Specialty:  Internal Medicine Contact information: [address] [place] [address] [phone]           Tests Needing Follow-up: -f/u CXR to evaluated mediastinal/hilar LAD is indicated in 2-3 weeks, w/ consideration to CT chest if LAD persists  -routine recheck of renal function is suggested  -f/u of BP and determine if HCTZ/ARB/CCB combination should be resumed (BP controlled w/o meds at time of d/c)  Discharge Diagnoses: Acute hypoxic respiratory failure  Sepsis due to Pneumonia Rhinovirus pneumonitis / URI Sarcoidosis Mediastinal fullness - LAD Community acquired pneumonia + viral pneumonitis  Acute kidney injury with underlying CKD stage III Anemia of chronic inflammatory disease  Initial presentation: 51 yo malewith history of sarcoidosis who came to the hospital with complaints of severe shortness of breath. He was admitted to stepdown unit as he was requiring BiPAP/high flow O2.  Hospital Course:  Acute hypoxic respiratory failure  -due to community-acquired pneumonia + Viral URI (Rhinovirus +) in the setting of chronic sarcoidosis -on methotrexate at home for sarcoidosis and follows with a Rheumatologist - to resume MTX after d/c  -is not usually on supplemental O2 at home - though O2 requirement was high initially, the pt was able to be weaned safely to RA prior to his d/c home   Mediastinal fullness - LAD -Chest x-ray showed mediastinal fullness likely from adenopathy/pulmonary infiltrates which could be from pneumonia. Last CT chest was 03/11/2016.  Hopefully this adenopathy will resolve with this acute episode/pneumonia, if not he may end up needing biopsy in the future. -discussed w/ pt in detail on 2 different occassions - explained that f/u CXR and or CT will be indicated once he has completed tx for his PNA and had enough time to expect improvement (2-3 weeks)  Community acquired pneumonia + viral pneumonitis  -Strep negative - influenza negative - respiratory virus panel positive for Rhinovirus  -HIV nonreactive -completed 7 days of inpatient abx tx  - CXR already improved at time of d/c   Acute kidney injury with underlying CKD stage III -Avoid nephrotoxic drugs - cont to follow crt trend - baseline crt/GFR 1.74/51 as of Jan 2015 - now essentially returned to baseline   Last Labs    Recent Labs Lab 09/27/16 0457 09/28/16 0309 09/30/16 0334 10/02/16 0623  CREATININE 2.41* 2.19* 2.15* 1.78*     Anemia of chronic inflammatory disease Fe studies most c/w ACD c/w his Sarcoid    Allergies as of 10/03/2016      Reactions   Penicillins Hives      Medication List    STOP taking these medications   levofloxacin 500 MG tablet Commonly known as:  LEVAQUIN   Olmesartan-Amlodipine-HCTZ 40-5-12.5 MG Tabs     TAKE these medications   alendronate 70 MG tablet Commonly known as:  FOSAMAX Take 70 mg by mouth once a week. On Sunday   folic acid 1 MG tablet Commonly known as:  FOLVITE Take 1 mg by mouth daily.   methotrexate 2.5 MG tablet Commonly known as:  RHEUMATREX Take 10 mg by mouth 2 (two) times a week. On Thursday and  Friday   multivitamin with minerals Tabs tablet Take 1 tablet by mouth daily.   predniSONE 10 MG tablet Commonly known as:  DELTASONE Take 2 tablets (20 mg total) by mouth daily.       Day of Discharge BP 124/79 (BP Location: Left Arm)   Pulse 69   Temp 97.8 F (36.6 C) (Oral)   Resp 17   Ht 5\' 8"  (1.727 m)   Wt 108 kg (238 lb 1.6 oz)   SpO2 95%   BMI 36.20 kg/m   Physical  Exam: General: No acute respiratory distress Lungs: Clear to auscultation bilaterally without wheezes or crackles Cardiovascular: Regular rate and rhythm without murmur gallop or rub normal S1 and S2 Abdomen: Nontender, nondistended, soft, bowel sounds positive, no rebound, no ascites, no appreciable mass Extremities: No significant cyanosis, clubbing, or edema bilateral lower extremities  Basic Metabolic Panel:  Recent Labs Lab 09/27/16 0457 09/28/16 0309 09/30/16 0334 10/02/16 0623 10/03/16 0757  NA 131* 136 136 136 138  K 3.9 4.4 4.8 4.1 4.3  CL 99* 106 107 107 109  CO2 20* 21* 24 23 21*  GLUCOSE 128* 148* 135* 187* 104*  BUN 34* 30* 36* 35* 29*  CREATININE 2.41* 2.19* 2.15* 1.78* 1.76*  CALCIUM 8.9 8.9 9.1 8.8* 9.0    Liver Function Tests:  Recent Labs Lab 09/28/16 0309  AST 29  ALT 22  ALKPHOS 51  BILITOT 0.4  PROT 6.2*  ALBUMIN 2.7*   Coags:  Recent Labs Lab 09/27/16 1158  INR 1.29   CBC:  Recent Labs Lab 09/27/16 0457 09/28/16 0309 09/30/16 0334 10/01/16 0732  WBC 15.0* 16.2* 32.2* 29.7*  NEUTROABS 8.9*  --   --   --   HGB 9.7* 9.0* 8.6* 8.9*  HCT 28.3* 27.2* 26.6* 27.4*  MCV 89.0 91.0 94.0 93.5  PLT 277 296 338 350    Recent Results (from the past 240 hour(s))  Blood culture (routine x 2)     Status: None   Collection Time: 09/27/16  4:59 AM  Result Value Ref Range Status   Specimen Description BLOOD LEFT HAND  Final   Special Requests IN PEDIATRIC BOTTLE 3CC  Final   Culture NO GROWTH 5 DAYS  Final   Report Status 10/02/2016 FINAL  Final  Blood culture (routine x 2)     Status: None   Collection Time: 09/27/16  5:05 AM  Result Value Ref Range Status   Specimen Description BLOOD BLOOD RIGHT FOREARM  Final   Special Requests BOTTLES DRAWN AEROBIC AND ANAEROBIC 5CC  Final   Culture NO GROWTH 5 DAYS  Final   Report Status 10/02/2016 FINAL  Final  Rapid strep screen     Status: None   Collection Time: 09/27/16  5:10 AM  Result Value  Ref Range Status   Streptococcus, Group A Screen (Direct) NEGATIVE NEGATIVE Final    Comment: (NOTE) A Rapid Antigen test may result negative if the antigen level in the sample is below the detection level of this test. The FDA has not cleared this test as a stand-alone test therefore the rapid antigen negative result has reflexed to a Group A Strep culture.   Culture, group A strep     Status: None   Collection Time: 09/27/16  5:10 AM  Result Value Ref Range Status   Specimen Description THROAT  Final   Special Requests NONE Reflexed from [id number]  Final   Culture NO GROUP A STREP (S.PYOGENES) ISOLATED  Final  Report Status 09/29/2016 FINAL  Final  Respiratory Panel by PCR     Status: Abnormal   Collection Time: 09/27/16 10:41 AM  Result Value Ref Range Status   Adenovirus NOT DETECTED NOT DETECTED Final   Coronavirus 229E NOT DETECTED NOT DETECTED Final   Coronavirus HKU1 NOT DETECTED NOT DETECTED Final   Coronavirus NL63 NOT DETECTED NOT DETECTED Final   Coronavirus OC43 NOT DETECTED NOT DETECTED Final   Metapneumovirus NOT DETECTED NOT DETECTED Final   Rhinovirus / Enterovirus DETECTED (A) NOT DETECTED Final   Influenza A NOT DETECTED NOT DETECTED Final   Influenza B NOT DETECTED NOT DETECTED Final   Parainfluenza Virus 1 NOT DETECTED NOT DETECTED Final   Parainfluenza Virus 2 NOT DETECTED NOT DETECTED Final   Parainfluenza Virus 3 NOT DETECTED NOT DETECTED Final   Parainfluenza Virus 4 NOT DETECTED NOT DETECTED Final   Respiratory Syncytial Virus NOT DETECTED NOT DETECTED Final   Bordetella pertussis NOT DETECTED NOT DETECTED Final   Chlamydophila pneumoniae NOT DETECTED NOT DETECTED Final   Mycoplasma pneumoniae NOT DETECTED NOT DETECTED Final  MRSA PCR Screening     Status: None   Collection Time: 09/27/16 10:41 AM  Result Value Ref Range Status   MRSA by PCR NEGATIVE NEGATIVE Final    Comment:        The GeneXpert MRSA Assay (FDA approved for NASAL specimens only), is  one component of a comprehensive MRSA colonization surveillance program. It is not intended to diagnose MRSA infection nor to guide or monitor treatment for MRSA infections.      Time spent in discharge (includes decision making & examination of pt): 35 minutes  10/03/2016, 10:58 AM   Cherene Altes, [facility] Office  [phone] Pager [phone]  On-Call/Text Page:      [email or web address]      password [facility]

## 2016-10-19 ENCOUNTER — Other Ambulatory Visit: Payer: Self-pay | Admitting: Rheumatology

## 2016-10-19 ENCOUNTER — Ambulatory Visit
Admission: RE | Admit: 2016-10-19 | Discharge: 2016-10-19 | Disposition: A | Discharge status: Home / Self Care | Payer: BC Managed Care – PPO | Source: Ambulatory Visit | Attending: Rheumatology | Admitting: Rheumatology

## 2016-10-19 DIAGNOSIS — J069 Acute upper respiratory infection, unspecified: Secondary | ICD-10-CM

## 2016-10-19 DIAGNOSIS — R0602 Shortness of breath: Secondary | ICD-10-CM

## 2016-10-19 DIAGNOSIS — D869 Sarcoidosis, unspecified: Secondary | ICD-10-CM

## 2016-11-18 ENCOUNTER — Institutional Professional Consult (permissible substitution): Payer: BC Managed Care – PPO | Admitting: Internal Medicine

## 2016-11-22 ENCOUNTER — Ambulatory Visit (INDEPENDENT_AMBULATORY_CARE_PROVIDER_SITE_OTHER): Payer: BC Managed Care – PPO | Admitting: Internal Medicine

## 2016-11-22 ENCOUNTER — Other Ambulatory Visit (INDEPENDENT_AMBULATORY_CARE_PROVIDER_SITE_OTHER): Payer: BC Managed Care – PPO

## 2016-11-22 ENCOUNTER — Encounter: Payer: Self-pay | Admitting: Internal Medicine

## 2016-11-22 DIAGNOSIS — N189 Chronic kidney disease, unspecified: Secondary | ICD-10-CM

## 2016-11-22 DIAGNOSIS — D649 Anemia, unspecified: Secondary | ICD-10-CM

## 2016-11-22 DIAGNOSIS — D869 Sarcoidosis, unspecified: Secondary | ICD-10-CM

## 2016-11-22 DIAGNOSIS — Z8701 Personal history of pneumonia (recurrent): Secondary | ICD-10-CM

## 2016-11-22 DIAGNOSIS — N179 Acute kidney failure, unspecified: Secondary | ICD-10-CM | POA: Diagnosis not present

## 2016-11-22 LAB — CBC WITH DIFFERENTIAL/PLATELET
Basophils Absolute: 0.1 10*3/uL (ref 0.0–0.1)
Basophils Relative: 0.8 % (ref 0.0–3.0)
EOS PCT: 1.1 % (ref 0.0–5.0)
Eosinophils Absolute: 0.1 10*3/uL (ref 0.0–0.7)
HCT: 33.4 % — ABNORMAL LOW (ref 39.0–52.0)
HEMOGLOBIN: 10.8 g/dL — AB (ref 13.0–17.0)
LYMPHS PCT: 32.9 % (ref 12.0–46.0)
Lymphs Abs: 2.4 10*3/uL (ref 0.7–4.0)
MCHC: 32.3 g/dL (ref 30.0–36.0)
MCV: 94.7 fl (ref 78.0–100.0)
MONOS PCT: 3.9 % (ref 3.0–12.0)
Monocytes Absolute: 0.3 10*3/uL (ref 0.1–1.0)
Neutro Abs: 4.5 10*3/uL (ref 1.4–7.7)
Neutrophils Relative %: 61.3 % (ref 43.0–77.0)
Platelets: 258 10*3/uL (ref 150.0–400.0)
RBC: 3.53 Mil/uL — AB (ref 4.22–5.81)
RDW: 15.1 % (ref 11.5–15.5)
WBC: 7.3 10*3/uL (ref 4.0–10.5)

## 2016-11-22 NOTE — Assessment & Plan Note (Signed)
Started with  B symptoms and lymph node enlargement in aug 2017 Today 11/22/2016  It seems sarcoid is well controlled In only see mild lung involvement back in au 2017 and as of 11/22/2016  Plan Reduce methotrexate to 10mg  once weekly Continue folic acid Contiue prednisone 5mg  per day Do PFT test in few to several weeks Sign release to get Dr Charlestine Night records  Followup - few to several weeks but after PFT test

## 2016-11-22 NOTE — Patient Instructions (Signed)
Anemia Check cbc with diff 11/22/2016   Acute kidney injury superimposed on chronic kidney disease ([place]) Check bmet 11/22/2016 Refer renal to sort out if sarcoid v BP related - will cancel referral if kidney is normal 11/22/2016   Sarcoidosis ([place]) Started with  B symptoms and lymph node enlargement in aug 2017 Today 11/22/2016  It seems sarcoid is well controlled In only see mild lung involvement back in au 2017 and as of 11/22/2016  Plan Reduce methotrexate to 10mg  once weekly Continue folic acid Contiue prednisone 5mg  per day Do PFT test in few to several weeks Sign release to get Dr Charlestine Night records  Followup - few to several weeks but after PFT test   History of viral pneumonia Suffered in march 2018 - got sicker than usual to immune supprsed state from medication CXR April 2018 shows resolution  followup expectant

## 2016-11-22 NOTE — Progress Notes (Signed)
   Subjective:    Patient ID: Mike Cruz, male    DOB: 09/02/1965, 51 y.o.   MRN: [id number]  HPI    Review of Systems  Constitutional: Negative for fever and unexpected weight change.  HENT: Negative for congestion, dental problem, ear pain, nosebleeds, postnasal drip, rhinorrhea, sinus pressure, sneezing, sore throat and trouble swallowing.   Eyes: Negative for redness and itching.  Respiratory: Positive for cough and shortness of breath. Negative for chest tightness and wheezing.   Cardiovascular: Negative for palpitations and leg swelling.  Gastrointestinal: Negative for nausea and vomiting.  Genitourinary: Negative for dysuria.  Musculoskeletal: Negative for joint swelling.  Skin: Negative for rash.  Neurological: Negative for headaches.  Hematological: Does not bruise/bleed easily.  Psychiatric/Behavioral: Negative for dysphoric mood. The patient is not nervous/anxious.        Objective:   Physical Exam        Assessment & Plan:

## 2016-11-22 NOTE — Assessment & Plan Note (Signed)
Check bmet 11/22/2016 Refer renal to sort out if sarcoid v BP related - will cancel referral if kidney is normal 11/22/2016

## 2016-11-22 NOTE — Assessment & Plan Note (Signed)
Check cbc with diff 11/22/2016

## 2016-11-22 NOTE — Assessment & Plan Note (Signed)
Suffered in march 2018 - got sicker than usual to immune supprsed state from medication CXR April 2018 shows resolution  followup expectant

## 2016-11-22 NOTE — Progress Notes (Signed)
Subjective:     Patient ID: Mike Cruz, male   DOB: 1966-03-23, 51 y.o.   MRN: [id number]  PCP Josetta Huddle, MD  HPI  IOV 11/22/2016  Chief Complaint  Patient presents with  . Pulmonary Consult    Pt referred by Dr. Inda Merlin for sarcoid. Pt c/o SOB with activity x 9 months. Pt states he was recently hospitalized for BIL pna. Pt c/o non prod cough, chest congstion. Pt denies f/c/s and CP/tightness.     51 year old male. He is a [name]. 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 He tells me that starting July August 2017 started noticing left-sided cervical adenopathy associated with some night sweats and mild shortness of breath and fatigue. Workup in August 2017 : 03/11/2016 had CT chest and abdomen pelvis: I personally visualized this film and it is significant for diffuse adenopathy in the mediastinal and abdominal areas. 03/12/2016 the following day he underwent cervical lymph node biopsy by Dr. Arville Care and this showed granulomatous lymphadenitis.. Tthis led to the diagnosis of sarcoidosis. On the CT chest as pulmonary lung fields had just some scattered mild nodularity which is reflected on the chest x-ray from 10/19/2016 which I personally visualized that shows just mild interstitial prominence c/w aug 2017 films and improved from pneumonia admit march 2018. Then in September 2017 he was started on prednisone. By October November 2017 due to lack of resolution of the symptoms and the lymph node area he was referred to Dr.  Charlestine Night rheumatologist   (since retired) and was started on methotrexate. Patient states that he is on 10 mg of methotrexate on Thursday once weekly and 10 mg of methotrexate on Friday once weekly totaling over 20 mg of methotrexate weekly. He is on folic acid. With this he did improve upon his symptoms with the fatigue and shortness of breath and the lymphadenopathy. However he has not had follow-up CT imaging of his abdomen. Then  in March 2018 was diagnosed with rhinovirus respiratory infection pneumonia and was admitted. HIV test was negative at that time. He did have acute kidney injury which seemed to be improving as of 10/03/2016 but no follow-up labs since then on the computer. He also seems to have chronic anemia. CXR shows resultion April 2018. Echocardiogram 10/01/2016: shows left ventricle ejection fraction 55% with grade 1 diastolic dysfunction  Currently he has improved in terms of his chest x-ray. He still has some post pneumonia fatigue but otherwise is feeling stable.  He particularly denies any clinical diagnosis of sarcoidosis in his brain or eyes or skin or renal system although he is known to have high elevated creatinine for a few years which has been presumed due to hypertension. He has never seen a nephrologist.   Results for DRAE, MITZEL (MRN [id number]) as of 11/22/2016 16:43  Ref. Range 09/27/2016 04:57 09/27/2016 11:58 09/28/2016 03:09 09/30/2016 03:34 10/01/2016 07:32  Hemoglobin Latest Ref Range: 13.0 - 17.0 g/dL 9.7 (L)  9.0 (L) 8.6 (L) 8.9 (L)   Results for FAHED, MORTEN (MRN [id number]) as of 11/22/2016 16:43  Ref. Range 09/29/2016 07:45 09/30/2016 03:34 10/01/2016 07:32 10/02/2016 06:23 10/03/2016 07:57  Creatinine Latest Ref Range: 0.61 - 1.24 mg/dL  2.15 (H)  1.78 (H) 1.76 (H)  She  has a past medical history of Acute renal failure (ARF) ([place]); Acute respiratory failure ([place]); Anemia; Hypertension; Hyponatremia; Kidney stone; PONV (postoperative nausea and vomiting); and Sarcoidosis.   reports that he has never smoked. He has never used  smokeless tobacco.  Past Surgical History:  Procedure Laterality Date  . HEAD & NECK SKIN LESION EXCISIONAL BIOPSY    . KNEE SURGERY Left     Allergies  Allergen Reactions  . Penicillins Hives    Immunization History  Administered Date(s) Administered  . Influenza,inj,Quad PF,36+ Mos 04/18/2016    Family History  Problem Relation Age of Onset  . Cancer  Mother     ovarian  . AAA (abdominal aortic aneurysm) Father   . Cancer Father     prostate and kidney  . Stroke Sister      Current Outpatient Prescriptions:  .  alendronate (FOSAMAX) 70 MG tablet, Take 70 mg by mouth once a week. On Sunday, Disp: , Rfl:  .  folic acid (FOLVITE) 1 MG tablet, Take 1 mg by mouth daily., Disp: , Rfl:  .  methotrexate (RHEUMATREX) 2.5 MG tablet, Take 10 mg by mouth 2 (two) times a week. On Thursday and Friday, Disp: , Rfl:  .  Multiple Vitamin (MULTIVITAMIN WITH MINERALS) TABS tablet, Take 1 tablet by mouth daily., Disp: , Rfl:  .  Olmesartan-Amlodipine-HCTZ 40-5-12.5 MG TABS, Take 1 tablet by mouth every morning., Disp: , Rfl: 3 .  predniSONE (DELTASONE) 10 MG tablet, Take 2 tablets (20 mg total) by mouth daily. (Patient taking differently: Take 5 mg by mouth daily. ), Disp: 30 tablet, Rfl: 0   Review of Systems     Objective:   Physical Exam  Constitutional: He is oriented to person, place, and time. He appears well-developed and well-nourished. No distress.  HENT:  Head: Normocephalic and atraumatic.  Right Ear: External ear normal.  Left Ear: External ear normal.  Mouth/Throat: Oropharynx is clear and moist. No oropharyngeal exudate.  Eyes: Conjunctivae and EOM are normal. Pupils are equal, round, and reactive to light. Right eye exhibits no discharge. Left eye exhibits no discharge. No scleral icterus.  Neck: Normal range of motion. Neck supple. No JVD present. No tracheal deviation present. No thyromegaly present.  Cardiovascular: Normal rate, regular rhythm and intact distal pulses.  Exam reveals no gallop and no friction rub.   No murmur heard. Pulmonary/Chest: Effort normal and breath sounds normal. No respiratory distress. He has no wheezes. He has no rales. He exhibits no tenderness.  Abdominal: Soft. Bowel sounds are normal. He exhibits no distension and no mass. There is no tenderness. There is no rebound and no guarding.   Musculoskeletal: Normal range of motion. He exhibits no edema or tenderness.  Lymphadenopathy:    He has no cervical adenopathy.  Neurological: He is alert and oriented to person, place, and time. He has normal reflexes. No cranial nerve deficit. Coordination normal.  Skin: Skin is warm and dry. No rash noted. He is not diaphoretic. No erythema. No pallor.  Psychiatric: He has a normal mood and affect. His behavior is normal. Judgment and thought content normal.  Nursing note and vitals reviewed.  Vitals:   11/22/16 1637  BP: 122/78  Pulse: 78  SpO2: 98%  Weight: 240 lb 9.6 oz (109.1 kg)  Height: 5\' 9"  (1.753 m)    Estimated body mass index is 35.53 kg/m as calculated from the following:   Height as of this encounter: 5\' 9"  (1.753 m).   Weight as of this encounter: 240 lb 9.6 oz (109.1 kg).      Assessment:       ICD-9-CM ICD-10-CM   1. Anemia, unspecified type 285.9 D64.9   2. Acute kidney injury superimposed on chronic  kidney disease ([place]) 866.00 N17.9    585.9 N18.9   3. Sarcoidosis 135 D86.9   4. History of viral pneumonia V12.61 Z87.01        Plan:     Anemia Check cbc with diff 11/22/2016   Acute kidney injury superimposed on chronic kidney disease ([place]) Check bmet 11/22/2016 Refer renal to sort out if sarcoid v BP related - will cancel referral if kidney is normal 11/22/2016   Sarcoidosis ([place]) Started with  B symptoms and lymph node enlargement in aug 2017 Today 11/22/2016  It seems sarcoid is well controlled In only see mild lung involvement back in au 2017 and as of 11/22/2016  Plan Reduce methotrexate to 10mg  once weekly Continue folic acid Contiue prednisone 5mg  per day Do PFT test in few to several weeks Sign release to get Dr Charlestine Night records  Followup - few to several weeks but after PFT test   History of viral pneumonia Suffered in march 2018 - got sicker than usual to immune supprsed state from medication CXR April 2018 shows  resolution  followup expectant    Dr. Brand Males, M.D., [facility]P Pulmonary and Critical Care Medicine Staff Physician [place] [facility] Pager: [phone], If no answer or between  15:00h - 7:00h: call [phone]  11/22/2016 5:17 PM

## 2016-11-23 ENCOUNTER — Telehealth: Payer: Self-pay | Admitting: Internal Medicine

## 2016-11-23 DIAGNOSIS — N179 Acute kidney failure, unspecified: Secondary | ICD-10-CM

## 2016-11-23 DIAGNOSIS — N189 Chronic kidney disease, unspecified: Principal | ICD-10-CM

## 2016-11-23 LAB — BASIC METABOLIC PANEL
BUN: 22 mg/dL (ref 6–23)
CALCIUM: 10.2 mg/dL (ref 8.4–10.5)
CO2: 28 meq/L (ref 19–32)
Chloride: 106 mEq/L (ref 96–112)
Creatinine, Ser: 1.62 mg/dL — ABNORMAL HIGH (ref 0.40–1.50)
GFR: 57.6 mL/min — ABNORMAL LOW (ref >60.00)
GLUCOSE: 99 mg/dL (ref 70–99)
Potassium: 4.3 mEq/L (ref 3.5–5.1)
Sodium: 141 mEq/L (ref 135–145)

## 2016-11-23 NOTE — Telephone Encounter (Signed)
Plese tell Mike Cruz that anemia and kidneys better but kidney still on higher side; might be his baseline. sTill a good idea to see renal doc  Thanks  Dr. Brand Males, M.D., [facility]C.P Pulmonary and Critical Care Medicine Staff Physician [place] [facility] Pager: [phone], If no answer or between  15:00h - 7:00h: call [phone]  11/23/2016 5:53 PM     PULMONARY No results for input(s): PHART, PCO2ART, PO2ART, HCO3, TCO2, O2SAT in the last 168 hours.  Invalid input(s): PCO2, PO2  CBC  Recent Labs Lab 11/22/16 1723  HGB 10.8*  HCT 33.4*  WBC 7.3  PLT 258.0    COAGULATION No results for input(s): INR in the last 168 hours.  CARDIAC  No results for input(s): TROPONINI in the last 168 hours. No results for input(s): PROBNP in the last 168 hours.   CHEMISTRY  Recent Labs Lab 11/22/16 1723  NA 141  K 4.3  CL 106  CO2 28  GLUCOSE 99  BUN 22  CREATININE 1.62*  CALCIUM 10.2   Estimated Creatinine Clearance: 64.2 mL/min (A) (by C-G formula based on SCr of 1.62 mg/dL (H)).   LIVER No results for input(s): AST, ALT, ALKPHOS, BILITOT, PROT, ALBUMIN, INR in the last 168 hours.   INFECTIOUS No results for input(s): LATICACIDVEN, PROCALCITON in the last 168 hours.   ENDOCRINE CBG (last 3)  No results for input(s): GLUCAP in the last 72 hours.       IMAGING x48h  - image(s) personally visualized  -   highlighted in bold No results found.

## 2016-11-24 NOTE — Telephone Encounter (Signed)
Patient returning call- he can be reached at [phone]

## 2016-11-24 NOTE — Telephone Encounter (Signed)
lmtcb X1 for pt. Routing back to Elise for follow up.  

## 2016-11-25 NOTE — Telephone Encounter (Signed)
Called spoke with patient, advised of lab results/recs as stated by MR Pt voiced his understanding and denied any questions/concerns Pt okay with seeing renal Referral placed  Nothing further needed; will sign off

## 2016-11-25 NOTE — Telephone Encounter (Signed)
Did not realize referral had already been placed Will cancel the referral I placed earlier

## 2016-11-25 NOTE — Addendum Note (Signed)
Addended by: Parke Poisson E on: 11/25/2016 01:04 PM   Modules accepted: Orders

## 2016-12-02 ENCOUNTER — Telehealth: Payer: Self-pay | Admitting: Internal Medicine

## 2016-12-02 NOTE — Telephone Encounter (Signed)
Elise  Ensure followup first availab but after PFT  Dr. Brand Males, M.D., [facility].C.P Pulmonary and Critical Care Medicine Staff Physician [place] [facility] Pager: [phone], If no answer or between  15:00h - 7:00h: call [phone]  12/02/2016 6:28 PM

## 2016-12-03 NOTE — Telephone Encounter (Signed)
MR your first available isnt until early July, please advise if APP can see pt after PFT. Thanks.

## 2016-12-09 ENCOUNTER — Other Ambulatory Visit: Payer: Self-pay | Admitting: Nephrology

## 2016-12-09 DIAGNOSIS — N189 Chronic kidney disease, unspecified: Secondary | ICD-10-CM

## 2016-12-09 NOTE — Telephone Encounter (Signed)
ATC to call pt, voicemail full NEEDS OV WITH NP after PFT 12/10/16

## 2016-12-09 NOTE — Telephone Encounter (Signed)
That is fine  .Dr. Brand Males, M.D., [facility].C.P Pulmonary and Critical Care Medicine Staff Physician [place] [facility] Pager: [phone], If no answer or between  15:00h - 7:00h: call [phone]  12/09/2016 4:22 PM

## 2016-12-10 ENCOUNTER — Ambulatory Visit (INDEPENDENT_AMBULATORY_CARE_PROVIDER_SITE_OTHER): Payer: BC Managed Care – PPO | Admitting: Internal Medicine

## 2016-12-10 DIAGNOSIS — D869 Sarcoidosis, unspecified: Secondary | ICD-10-CM

## 2016-12-10 LAB — PULMONARY FUNCTION TEST
DL/VA % pred: 93 %
DL/VA: 4.24 ml/min/mmHg/L
DLCO COR % PRED: 68 %
DLCO cor: 20.47 ml/min/mmHg
DLCO unc % pred: 63 %
DLCO unc: 19.07 ml/min/mmHg
FEF 25-75 POST: 5.97 L/s
FEF 25-75 Pre: 4.71 L/sec
FEF2575-%Change-Post: 26 %
FEF2575-%Pred-Post: 196 %
FEF2575-%Pred-Pre: 155 %
FEV1-%CHANGE-POST: 5 %
FEV1-%Pred-Post: 111 %
FEV1-%Pred-Pre: 105 %
FEV1-POST: 3.44 L
FEV1-Pre: 3.25 L
FEV1FVC-%CHANGE-POST: 2 %
FEV1FVC-%Pred-Pre: 110 %
FEV6-%Change-Post: 2 %
FEV6-%PRED-PRE: 97 %
FEV6-%Pred-Post: 100 %
FEV6-PRE: 3.68 L
FEV6-Post: 3.78 L
FEV6FVC-%PRED-PRE: 103 %
FEV6FVC-%Pred-Post: 103 %
FVC-%CHANGE-POST: 2 %
FVC-%PRED-POST: 97 %
FVC-%PRED-PRE: 94 %
FVC-POST: 3.79 L
FVC-PRE: 3.68 L
POST FEV6/FVC RATIO: 100 %
PRE FEV6/FVC RATIO: 100 %
Post FEV1/FVC ratio: 91 %
Pre FEV1/FVC ratio: 88 %
RV % pred: 67 %
RV: 1.35 L
TLC % PRED: 78 %
TLC: 5.22 L

## 2016-12-10 NOTE — Progress Notes (Signed)
PFT done today. 

## 2016-12-10 NOTE — Telephone Encounter (Signed)
Called and spoke with pt and he is aware of appt with SG on 6/6  Nothing further is needed.

## 2016-12-17 ENCOUNTER — Ambulatory Visit
Admission: RE | Admit: 2016-12-17 | Discharge: 2016-12-17 | Disposition: A | Discharge status: Home / Self Care | Payer: BC Managed Care – PPO | Source: Ambulatory Visit | Attending: Nephrology | Admitting: Nephrology

## 2016-12-17 DIAGNOSIS — N189 Chronic kidney disease, unspecified: Secondary | ICD-10-CM

## 2016-12-22 ENCOUNTER — Ambulatory Visit (INDEPENDENT_AMBULATORY_CARE_PROVIDER_SITE_OTHER): Payer: BC Managed Care – PPO | Admitting: Acute Care

## 2016-12-22 ENCOUNTER — Encounter: Payer: Self-pay | Admitting: Acute Care

## 2016-12-22 ENCOUNTER — Other Ambulatory Visit: Payer: Self-pay | Admitting: Nephrology

## 2016-12-22 VITALS — BP 120/78 | HR 55 | Ht 68.0 in | Wt 253.8 lb

## 2016-12-22 DIAGNOSIS — D869 Sarcoidosis, unspecified: Secondary | ICD-10-CM

## 2016-12-22 DIAGNOSIS — D649 Anemia, unspecified: Secondary | ICD-10-CM | POA: Diagnosis not present

## 2016-12-22 DIAGNOSIS — N281 Cyst of kidney, acquired: Secondary | ICD-10-CM

## 2016-12-22 NOTE — Assessment & Plan Note (Signed)
Screening hemoglobin for PFTs 12.4 Please follow up with PCP regarding anemia and possible treatment.

## 2016-12-22 NOTE — Assessment & Plan Note (Signed)
Pulmonary sarcoidosis well-controlled on current regimen of methotrexate 10 mg once weekly and prednisone 5 mg daily. Patient has complaints of dyspnea on exertion, patient is obese and deconditioning may be contributing in addition to possible sarcoid contribution. Plan Pulmonary function tests are normal wuth exception of your diffusion capacity, which is a bit low. We will send in a referral for Pulmonary rehab. I have messaged Dr. Chase Caller about trying a rescue inhaler. I will let you know what he decides. Continue your methotrexate and prednisone as you have been doing. Check with PCP about anemia and possible treatment. Follow up in 6 months with Dr. Chase Caller Please contact office for sooner follow up if symptoms do not improve or worsen or seek emergency care

## 2016-12-22 NOTE — Progress Notes (Signed)
History of Present Illness Mike Cruz is a 51 y.o. male never smoker with sarcoidosis maintained on Methotrexate and prednisone. He is followed by Dr. Chase Caller.   12/22/2016 Office Follow up after PFT's Patient presents today for follow-up. He was seen by Dr. Chase Caller on 11/22/2016. Plan at that time was as follows.  Acute kidney injury superimposed on chronic kidney disease ([place]) Check bmet 11/22/2016 Refer renal to sort out if sarcoid v BP related - will cancel referral if kidney is normal 11/22/2016   Sarcoidosis ([place]) Started with  B symptoms and lymph node enlargement in aug 2017 Today 11/22/2016  It seems sarcoid is well controlled In only see mild lung involvement back in au 2017 and as of 11/22/2016  Plan Reduce methotrexate to 10mg  once weekly Continue folic acid Contiue prednisone 5mg  per day Do PFT test in few to several weeks Sign release to get Dr Charlestine Night records  Follow up 12/22/2016 Patient presents today for results of this PFTs. Pulmonary function tests are within normal limits with exception of DLCO. DLCO is reduced at 63%. Hemoglobin at screening was 12.4, so this may be impacted by anemia. Patiently is currently maintained on Methotrexate 10 mg once weekly and prednisone 5 mg daily. He is compliant with his medication regimen. His only complaint is dyspnea on exertion. He denies chest pain, fever, orthopnea, or hemoptysis. Dr. Elmon Else records have not yet been faxed to the office. Renal referral is in progress.   Test Results:  CBC Latest Ref Rng & Units 11/22/2016 10/01/2016 09/30/2016  WBC 4.0 - 10.5 K/uL 7.3 29.7(H) 32.2(H)  Hemoglobin 13.0 - 17.0 g/dL 10.8(L) 8.9(L) 8.6(L)  Hematocrit 39.0 - 52.0 % 33.4(L) 27.4(L) 26.6(L)  Platelets 150.0 - 400.0 K/uL 258.0 350 338    BMP Latest Ref Rng & Units 11/22/2016 10/03/2016 10/02/2016  Glucose 70 - 99 mg/dL 99 104(H) 187(H)  BUN 6 - 23 mg/dL 22 29(H) 35(H)  Creatinine 0.40 - 1.50 mg/dL 1.62(H) 1.76(H) 1.78(H)    Sodium 135 - 145 mEq/L 141 138 136  Potassium 3.5 - 5.1 mEq/L 4.3 4.3 4.1  Chloride 96 - 112 mEq/L 106 109 107  CO2 19 - 32 mEq/L 28 21(L) 23  Calcium 8.4 - 10.5 mg/dL 10.2 9.0 8.8(L)    BNP    Component Value Date/Time   BNP 34.9 09/27/2016 0457    PFT    Component Value Date/Time   FEV1PRE 3.25 12/10/2016 1607   FEV1POST 3.44 12/10/2016 1607   FVCPRE 3.68 12/10/2016 1607   FVCPOST 3.79 12/10/2016 1607   TLC 5.22 12/10/2016 1607   DLCOUNC 19.07 12/10/2016 1607   PREFEV1FVCRT 88 12/10/2016 1607   PSTFEV1FVCRT 91 12/10/2016 1607    US Renal  Result Date: 12/17/2016 CLINICAL DATA:  Chronic kidney disease of unspecified stage EXAM: RENAL / URINARY TRACT ULTRASOUND COMPLETE COMPARISON:  03/11/2016 CT abdomen and pelvis FINDINGS: Right Kidney: Length: 10.4 cm. Normal cortical thickness and echogenicity. No mass, hydronephrosis or shadowing calcification. Nonspecific 6 mm nonshadowing echogenic focus at central RIGHT kidney. Left Kidney: Length: 10.2 cm. Normal cortical thickness and echogenicity. Complex hypoechoic question solid nodule at mid LEFT kidney laterally 2.6 x 2.3 x 2.3 cm. Additional complex second nodule at upper pole of LEFT kidney, 2.9 x 2.5 x 2.6 cm. No hydronephrosis or shadowing calcifications. Nonspecific 8 mm nonshadowing echogenic focus in the mid LEFT kidney. Bladder: Partially distended, unremarkable. Prostate gland appears enlarged but incompletely imaged. IMPRESSION: Complex nodules at the LEFT kidney measuring 2.6 cm and 2.9  cm in greatest sizes; characterization of these lesions by MR imaging recommended, with and without contrast if patient's renal function permits, without contrast in the setting of significant renal dysfunction. Suspected prostatic enlargement. Electronically Signed   By: Lavonia Dana M.D.   On: 12/17/2016 16:25     Past medical hx Past Medical History:  Diagnosis Date  . Acute renal failure (ARF) ([place])   . Acute respiratory failure ([place])    . Anemia   . Hypertension   . Hyponatremia   . Kidney stone   . PONV (postoperative nausea and vomiting)    remembers some nausea postop  . Sarcoidosis      Social History  Substance Use Topics  . Smoking status: Never Smoker  . Smokeless tobacco: Never Used  . Alcohol use No    Tobacco Cessation: Patient is a never smoker  Past surgical hx, Family hx, Social hx all reviewed.  Current Outpatient Prescriptions on File Prior to Visit  Medication Sig  . alendronate (FOSAMAX) 70 MG tablet Take 70 mg by mouth once a week. On Sunday  . folic acid (FOLVITE) 1 MG tablet Take 1 mg by mouth daily.  . methotrexate (RHEUMATREX) 2.5 MG tablet Take 10 mg by mouth 2 (two) times a week. On Thursday and Friday  . Multiple Vitamin (MULTIVITAMIN WITH MINERALS) TABS tablet Take 1 tablet by mouth daily.  . Olmesartan-Amlodipine-HCTZ 40-5-12.5 MG TABS Take 1 tablet by mouth every morning.  . predniSONE (DELTASONE) 10 MG tablet Take 2 tablets (20 mg total) by mouth daily. (Patient taking differently: Take 5 mg by mouth daily. )   No current facility-administered medications on file prior to visit.      Allergies  Allergen Reactions  . Penicillins Hives    Review Of Systems:  Constitutional:   No  weight loss, night sweats,  Fevers, chills, fatigue, or  lassitude.  HEENT:   No headaches,  Difficulty swallowing,  Tooth/dental problems, or  Sore throat,                No sneezing, itching, ear ache, nasal congestion, post nasal drip,   CV:  No chest pain,  Orthopnea, PND, swelling in lower extremities, anasarca, dizziness, palpitations, syncope.   GI  No heartburn, indigestion, abdominal pain, nausea, vomiting, diarrhea, change in bowel habits, loss of appetite, bloody stools.   Resp: + shortness of breath with exertion or at rest.  No excess mucus, no productive cough,  No non-productive cough,  No coughing up of blood.  No change in color of mucus.  No wheezing.  No chest wall  deformity  Skin: no rash or lesions.  GU: no dysuria, change in color of urine, no urgency or frequency.  No flank pain, no hematuria   MS:  No joint pain or swelling.  No decreased range of motion.  No back pain.  Psych:  No change in mood or affect. No depression or anxiety.  No memory loss.   Vital Signs BP 120/78 (BP Location: Left Arm, Cuff Size: Large)   Pulse (!) 55   Ht 5\' 8"  (1.727 m)   Wt 253 lb 12.8 oz (115.1 kg)   SpO2 97%   BMI 38.59 kg/m    Physical Exam:  General- No distress,  A&Ox3, pleasant ENT: No sinus tenderness, TM clear, pale nasal mucosa, no oral exudate,no post nasal drip, no LAN Cardiac: S1, S2, regular rate and rhythm, no murmur Chest: No wheeze/ rales/ dullness; no accessory muscle use, no  nasal flaring, no sternal retractions, diminished per bases Abd.: Soft Non-tender, non distended, bowel sounds positive, obese Ext: No clubbing cyanosis, edema Neuro:  normal strength Skin: No rashes, warm and dry Psych: normal mood and behavior   Assessment/Plan  Sarcoidosis (HCC) Pulmonary sarcoidosis well-controlled on current regimen of methotrexate 10 mg once weekly and prednisone 5 mg daily. Patient has complaints of dyspnea on exertion, patient is obese and deconditioning may be contributing in addition to possible sarcoid contribution. Plan Pulmonary function tests are normal wuth exception of your diffusion capacity, which is a bit low. We will send in a referral for Pulmonary rehab. I have messaged Dr. Chase Caller about trying a rescue inhaler. I will let you know what he decides. Continue your methotrexate and prednisone as you have been doing. Check with PCP about anemia and possible treatment. Follow up in 6 months with Dr. Chase Caller Please contact office for sooner follow up if symptoms do not improve or worsen or seek emergency care    Anemia Screening hemoglobin for PFTs 12.4 Please follow up with PCP regarding anemia and possible  treatment.    Magdalen Spatz, NP 12/22/2016  7:39 PM

## 2016-12-22 NOTE — Patient Instructions (Addendum)
It is good to see you today. Pulmonary function tests are normal wuth exception of your diffusion capacity, which is a bit low. We will send in a referral for Pulmonary rehab. I have messaged Dr. Chase Caller about trying a rescue inhaler. I will let you know what he decides. Continue your methotrexate and prednisone as you have been doing. Check with PCP about anemia and possible treatment. Follow up in 6 months with Dr. Chase Caller Please contact office for sooner follow up if symptoms do not improve or worsen or seek emergency care

## 2016-12-31 DIAGNOSIS — Z7952 Long term (current) use of systemic steroids: Secondary | ICD-10-CM | POA: Insufficient documentation

## 2016-12-31 DIAGNOSIS — Z87442 Personal history of urinary calculi: Secondary | ICD-10-CM | POA: Insufficient documentation

## 2016-12-31 DIAGNOSIS — R748 Abnormal levels of other serum enzymes: Secondary | ICD-10-CM | POA: Insufficient documentation

## 2016-12-31 DIAGNOSIS — Z8679 Personal history of other diseases of the circulatory system: Secondary | ICD-10-CM | POA: Insufficient documentation

## 2016-12-31 DIAGNOSIS — Z862 Personal history of diseases of the blood and blood-forming organs and certain disorders involving the immune mechanism: Secondary | ICD-10-CM | POA: Insufficient documentation

## 2016-12-31 DIAGNOSIS — Z87438 Personal history of other diseases of male genital organs: Secondary | ICD-10-CM | POA: Insufficient documentation

## 2016-12-31 DIAGNOSIS — Z79899 Other long term (current) drug therapy: Secondary | ICD-10-CM | POA: Insufficient documentation

## 2016-12-31 NOTE — Progress Notes (Signed)
Office Visit Note  Patient: Mike Cruz             Date of Birth: Feb 22, 1966           MRN: [id number]             PCP: Josetta Huddle, MD Referring: Josetta Huddle, MD Visit Date: 01/04/2017 Occupation: English as a second language teacher [employer]    Subjective:  Fatigue.   History of Present Illness: Mike Cruz is a 51 y.o. male with history of sarcoidosis seen in consultation per request of his PCP. According to patient his symptoms as started in August 2017 as he found a lump on the left side of his neck. He was also feeling tired at the time. He had a lymph node biopsy which was positive for sarcoidosis. He was started on prednisone and has the symptoms persist she was referred to Dr. Charlestine Night. He had a CT scan of his chest and abdomen which showed diffuse hilar lymphadenopathy and lymph nodes and abdominal region. He was started on methotrexate in November 2017. He recalls in March 2018 he developed viral pneumonia and he was hospitalized. He also had a sarcoidosis flare. He is seeing Dr. Chase Caller since then. His repeat chest x-ray in April 2018 showed resolution of his pneumonia. After evaluating Dr. Chase Caller felt that his disease was mild and advised him to decrease his methotrexate from 20 mg to 10 mg by mouth every week. And also reduced his prednisone from 20 mg a day to 5 mg a day. He has done well on the reduced dose of methotrexate and prednisone. He denies any shortness of breath there is no history of joint involvement or skin involvement. He also developed decrease in his renal function on methotrexate. He is seeing a nephrologist currently. According to patient he had some form of ischemia and now is a schedule for MRI this afternoon. His labs also indicate chronic anemia. I could not find an etiology on chart review. There is also mention of osteoporosis but I could not find a bone density test. His echocardiogram per report shows ejection fraction of 56% and mild diastolic  dysfunction.  Activities of Daily Living:  Patient reports morning stiffness for 0 minute.   Patient Denies nocturnal pain.  Difficulty dressing/grooming: Denies Difficulty climbing stairs: Denies Difficulty getting out of chair: Denies Difficulty using hands for taps, buttons, cutlery, and/or writing: Denies   Review of Systems  Constitutional: Positive for fatigue. Negative for night sweats and weakness ( ).  HENT: Negative for mouth sores, mouth dryness and nose dryness.   Eyes: Negative for redness and dryness.  Respiratory: Negative for shortness of breath and difficulty breathing.   Cardiovascular: Negative for chest pain, palpitations, hypertension, irregular heartbeat and swelling in legs/feet.  Gastrointestinal: Negative for constipation and diarrhea.  Endocrine: Negative for increased urination.  Musculoskeletal: Negative for arthralgias, joint pain, joint swelling, myalgias, muscle weakness, morning stiffness, muscle tenderness and myalgias.  Skin: Negative for color change, rash, hair loss, nodules/bumps, skin tightness, ulcers and sensitivity to sunlight.  Allergic/Immunologic: Negative for susceptible to infections.  Neurological: Negative for dizziness, fainting, memory loss and night sweats.  Hematological: Negative for swollen glands.  Psychiatric/Behavioral: Negative for depressed mood and sleep disturbance. The patient is not nervous/anxious.     PMFS History:  Patient Active Problem List   Diagnosis Date Noted  . History of hypertension 12/31/2016  . History of anemia 12/31/2016  . High risk medication use 12/31/2016  . History of renal calculi 12/31/2016  .  History of prostatitis 12/31/2016  . On prednisone therapy 12/31/2016  . Abnormal serum angiotensin-converting enzyme level 12/31/2016  . Acute kidney injury superimposed on chronic kidney disease ([place]) 11/22/2016  . History of viral pneumonia 11/22/2016  . Anemia 09/27/2016  . Sarcoidosis   .  Hypertension     Past Medical History:  Diagnosis Date  . Acute renal failure (ARF) ([place])   . Acute respiratory failure ([place])   . Anemia   . Hypertension   . Hyponatremia   . Kidney stone   . PONV (postoperative nausea and vomiting)    remembers some nausea postop  . Sarcoidosis     Family History  Problem Relation Age of Onset  . Cancer Mother        ovarian  . AAA (abdominal aortic aneurysm) Father   . Cancer Father        prostate and kidney  . Stroke Sister    Past Surgical History:  Procedure Laterality Date  . HEAD & NECK SKIN LESION EXCISIONAL BIOPSY    . KNEE SURGERY Left    Social History   Social History Narrative  . No narrative on file     Objective: Vital Signs: BP 136/74 (BP Location: Right Arm)   Resp 18   Ht 5\' 8"  (1.727 m)   Wt 244 lb (110.7 kg)   BMI 37.10 kg/m    Physical Exam  Constitutional: He is oriented to person, place, and time. He appears well-developed and well-nourished.  HENT:  Head: Normocephalic and atraumatic.  Eyes: Conjunctivae and EOM are normal. Pupils are equal, round, and reactive to light.  Neck: Normal range of motion. Neck supple.  Cardiovascular: Normal rate, regular rhythm and normal heart sounds.   Pulmonary/Chest: Effort normal and breath sounds normal.  Abdominal: Soft. Bowel sounds are normal.  Neurological: He is alert and oriented to person, place, and time.  Skin: Skin is warm and dry. Capillary refill takes less than 2 seconds.  Psychiatric: He has a normal mood and affect. His behavior is normal.  Nursing note and vitals reviewed.    Musculoskeletal Exam: C-spine and thoracic lumbar spine good range of motion. Shoulder joints elbow joints wrist joint MCPs PIPs DIPs are good range of motion. Hip joints knee joints ankles MTPs PIPs with good range of motion. No synovitis was noted.  CDAI Exam: No CDAI exam completed.    Investigation: Findings:  03/29/16 ACE elevated  155, ANA negative  09/17/16 Calcium  10.2, Creat 2.4, AST 18 ALT 27  Hemoglobin 9.6, WBC 12.5,  09/24/2016 Iron low 17, TIBC low 223 B 12 normal 396, Folate normal 11.5  10/19/2016 EXAM: CHEST  2 VIEW COMPARISON:  Chest x-ray of March 18 28  FINDINGS: The lungs are adequately inflated. Basilar atelectatic changes have nearly totally resolved. The interstitial markings of both lower lobes remain coarse. There is no alveolar infiltrate or pleural effusion. The heart and pulmonary vascularity are normal. The trachea is midline.  IMPRESSION: No discrete infiltrate. Minimal interstitial prominence in both lower lobes is present and likely reflects known sarcoidosis.   Electronically Signed   By: David  Martinique M.D.   On: 10/19/2016 09:02  CBC Latest Ref Rng & Units 11/22/2016 10/01/2016 09/30/2016  WBC 4.0 - 10.5 K/uL 7.3 29.7(H) 32.2(H)  Hemoglobin 13.0 - 17.0 g/dL 10.8(L) 8.9(L) 8.6(L)  Hematocrit 39.0 - 52.0 % 33.4(L) 27.4(L) 26.6(L)  Platelets 150.0 - 400.0 K/uL 258.0 350 338   CMP Latest Ref Rng & Units 11/22/2016 10/03/2016  10/02/2016  Glucose 70 - 99 mg/dL 99 104(H) 187(H)  BUN 6 - 23 mg/dL 22 29(H) 35(H)  Creatinine 0.40 - 1.50 mg/dL 1.62(H) 1.76(H) 1.78(H)  Sodium 135 - 145 mEq/L 141 138 136  Potassium 3.5 - 5.1 mEq/L 4.3 4.3 4.1  Chloride 96 - 112 mEq/L 106 109 107  CO2 19 - 32 mEq/L 28 21(L) 23  Calcium 8.4 - 10.5 mg/dL 10.2 9.0 8.8(L)  Total Protein 6.5 - 8.1 g/dL - - -  Total Bilirubin 0.3 - 1.2 mg/dL - - -  Alkaline Phos 38 - 126 U/L - - -  AST 15 - 41 U/L - - -  ALT 17 - 63 U/L - - -    Imaging: US Renal  Result Date: 12/17/2016 CLINICAL DATA:  Chronic kidney disease of unspecified stage EXAM: RENAL / URINARY TRACT ULTRASOUND COMPLETE COMPARISON:  03/11/2016 CT abdomen and pelvis FINDINGS: Right Kidney: Length: 10.4 cm. Normal cortical thickness and echogenicity. No mass, hydronephrosis or shadowing calcification. Nonspecific 6 mm nonshadowing echogenic focus at central RIGHT kidney. Left Kidney: Length:  10.2 cm. Normal cortical thickness and echogenicity. Complex hypoechoic question solid nodule at mid LEFT kidney laterally 2.6 x 2.3 x 2.3 cm. Additional complex second nodule at upper pole of LEFT kidney, 2.9 x 2.5 x 2.6 cm. No hydronephrosis or shadowing calcifications. Nonspecific 8 mm nonshadowing echogenic focus in the mid LEFT kidney. Bladder: Partially distended, unremarkable. Prostate gland appears enlarged but incompletely imaged. IMPRESSION: Complex nodules at the LEFT kidney measuring 2.6 cm and 2.9 cm in greatest sizes; characterization of these lesions by MR imaging recommended, with and without contrast if patient's renal function permits, without contrast in the setting of significant renal dysfunction. Suspected prostatic enlargement. Electronically Signed   By: Lavonia Dana M.D.   On: 12/17/2016 16:25    Speciality Comments: No specialty comments available.    Procedures:  No procedures performed Allergies: Penicillins   Assessment / Plan:     Visit Diagnoses: Sarcoidosis - +ACE, hilar adenopathy, +LN bx. Patient was diagnosed by Dr. Charlestine Night and had been on long-term high-dose prednisone along with some methotrexate. His dose of methotrexate and prednisone was reduced by Dr. Chase Caller recently. He is clinically doing well without any increased shortness of breath or any other symptoms. He has no joint involvement or skin involvement.  Abnormal serum angiotensin-converting enzyme level - ACE elevated 155 on 03/29/16 per Dr Charlestine Night   High risk medication use - MTX 5 tabs po qweek,Folic acid 1mg  po qd, prednisone 5 mg po qd. I will check his labs today which are listed as follows. My plan is to eventually taper him off prednisone.  Acute kidney injury superimposed on chronic kidney disease (HCC) (CKD stage 3): Patient is seeing nephrology now. He states he has had no renal function even prior to starting methotrexate. He had long-standing history of hypertension. He has MRI scheduled  this afternoon. He will request his nephrology records to before it to [place]. His renal functions have improved on lower dose of methotrexate.  History of hypertension: His systolic blood pressure is mildly elevated.  History of renal calculi  History of anemia: He has had chronic anemia. I'm uncertain about the etiology of that.  Other fatigue: We will check following labs today.  Other osteoporosis without current pathological fracture: Based on his medical records he is on Fosamax. I could not find a bone density result. I've advised patient to get those records if possible. If we do not find a  bone density result and we may have to schedule a DEXA scan.   Orders: Orders Placed This Encounter  Procedures  . DG DXA FRACTURE ASSESSMENT  . CBC with Differential/Platelet  . COMPLETE METABOLIC PANEL WITH GFR  . Sedimentation rate  . CK  . TSH  . Serum protein electrophoresis with reflex  . IgG, IgA, IgM  . Hepatitis panel, acute  . CBC with Differential/Platelet  . COMPLETE METABOLIC PANEL WITH GFR  . VITAMIN D 25 Hydroxy (Vit-D Deficiency, Fractures)  . Quantiferon tb gold assay (blood)   No orders of the defined types were placed in this encounter.   Face-to-face time spent with patient was 50 minutes. 50% of time was spent in counseling and coordination of care.  Follow-Up Instructions: Return for Sarcoidosis.   Bo Merino, MD  Note - This record has been created using Editor, commissioning.  Chart creation errors have been sought, but may not always  have been located. Such creation errors do not reflect on  the standard of medical care.

## 2017-01-04 ENCOUNTER — Ambulatory Visit (INDEPENDENT_AMBULATORY_CARE_PROVIDER_SITE_OTHER): Payer: BC Managed Care – PPO | Admitting: Rheumatology

## 2017-01-04 ENCOUNTER — Encounter: Payer: Self-pay | Admitting: Rheumatology

## 2017-01-04 ENCOUNTER — Ambulatory Visit
Admission: RE | Admit: 2017-01-04 | Discharge: 2017-01-04 | Disposition: A | Discharge status: Home / Self Care | Payer: BC Managed Care – PPO | Source: Ambulatory Visit | Attending: Nephrology | Admitting: Nephrology

## 2017-01-04 VITALS — BP 136/74 | Resp 18 | Ht 68.0 in | Wt 244.0 lb

## 2017-01-04 DIAGNOSIS — Z1159 Encounter for screening for other viral diseases: Secondary | ICD-10-CM | POA: Diagnosis not present

## 2017-01-04 DIAGNOSIS — N281 Cyst of kidney, acquired: Secondary | ICD-10-CM

## 2017-01-04 DIAGNOSIS — N179 Acute kidney failure, unspecified: Secondary | ICD-10-CM

## 2017-01-04 DIAGNOSIS — Z862 Personal history of diseases of the blood and blood-forming organs and certain disorders involving the immune mechanism: Secondary | ICD-10-CM | POA: Diagnosis not present

## 2017-01-04 DIAGNOSIS — Z79899 Other long term (current) drug therapy: Secondary | ICD-10-CM | POA: Diagnosis not present

## 2017-01-04 DIAGNOSIS — Z8679 Personal history of other diseases of the circulatory system: Secondary | ICD-10-CM

## 2017-01-04 DIAGNOSIS — Z111 Encounter for screening for respiratory tuberculosis: Secondary | ICD-10-CM | POA: Diagnosis not present

## 2017-01-04 DIAGNOSIS — R5383 Other fatigue: Secondary | ICD-10-CM

## 2017-01-04 DIAGNOSIS — Z7952 Long term (current) use of systemic steroids: Secondary | ICD-10-CM

## 2017-01-04 DIAGNOSIS — M818 Other osteoporosis without current pathological fracture: Secondary | ICD-10-CM | POA: Diagnosis not present

## 2017-01-04 DIAGNOSIS — R748 Abnormal levels of other serum enzymes: Secondary | ICD-10-CM

## 2017-01-04 DIAGNOSIS — Z87442 Personal history of urinary calculi: Secondary | ICD-10-CM | POA: Diagnosis not present

## 2017-01-04 DIAGNOSIS — N189 Chronic kidney disease, unspecified: Secondary | ICD-10-CM

## 2017-01-04 DIAGNOSIS — D869 Sarcoidosis, unspecified: Secondary | ICD-10-CM

## 2017-01-04 LAB — CBC WITH DIFFERENTIAL/PLATELET
BASOS ABS: 80 {cells}/uL (ref 0–200)
BASOS PCT: 1 %
EOS PCT: 2 %
Eosinophils Absolute: 160 cells/uL (ref 15–500)
HCT: 40.3 % (ref 38.5–50.0)
HEMOGLOBIN: 12.7 g/dL — AB (ref 13.2–17.1)
LYMPHS ABS: 1760 {cells}/uL (ref 850–3900)
Lymphocytes Relative: 22 %
MCH: 30 pg (ref 27.0–33.0)
MCHC: 31.5 g/dL — ABNORMAL LOW (ref 32.0–36.0)
MCV: 95.3 fL (ref 80.0–100.0)
MONOS PCT: 10 %
MPV: 11 fL (ref 7.5–12.5)
Monocytes Absolute: 800 cells/uL (ref 200–950)
NEUTROS ABS: 5200 {cells}/uL (ref 1500–7800)
Neutrophils Relative %: 65 %
PLATELETS: 197 10*3/uL (ref 140–400)
RBC: 4.23 MIL/uL (ref 4.20–5.80)
RDW: 14.8 % (ref 11.0–15.0)
WBC: 8 10*3/uL (ref 3.8–10.8)

## 2017-01-04 LAB — TSH: TSH: 2.32 m[IU]/L (ref 0.40–4.50)

## 2017-01-04 MED ORDER — GADOBENATE DIMEGLUMINE 529 MG/ML IV SOLN
20.0000 mL | Freq: Once | INTRAVENOUS | Status: AC | PRN
  Administered 2017-01-04: 20 mL via INTRAVENOUS

## 2017-01-04 NOTE — Patient Instructions (Addendum)
We recommend a pneumonia vaccine and Shingrix vaccine.  Please discuss this with your primary care provider  Standing Labs We placed an order today for your standing lab work.    Please come back and get your standing labs in August 2018 and every 3 months  We have open lab Monday through Friday from 8:30-11:30 AM and 1:30-4 PM at the office of Dr. Tresa Moore, PA.   The office is located at [address] [place], [place], [place] [address] No appointment is necessary.   Labs are drawn by [facility].  You may receive a bill from [facility] for your lab work. If you have any questions regarding directions or hours of operation,  please call [phone].    Methotrexate tablets What is this medicine? METHOTREXATE (METH oh TREX ate) is a chemotherapy drug used to treat cancer including breast cancer, leukemia, and lymphoma. This medicine can also be used to treat psoriasis and certain kinds of arthritis. This medicine may be used for other purposes; ask your health care provider or pharmacist if you have questions. COMMON BRAND NAME(S): Rheumatrex, Trexall What should I tell my health care provider before I take this medicine? They need to know if you have any of these conditions: -fluid in the stomach area or lungs -if you often drink alcohol -infection or immune system problems -kidney disease or on hemodialysis -liver disease -low blood counts, like low white cell, platelet, or red cell counts -lung disease -radiation therapy -stomach ulcers -ulcerative colitis -an unusual or allergic reaction to methotrexate, other medicines, foods, dyes, or preservatives -pregnant or trying to get pregnant -breast-feeding How should I use this medicine? Take this medicine by mouth with a glass of water. Follow the directions on the prescription label. Take your medicine at regular intervals. Do not take it more often than directed. Do not stop taking except on your doctor's  advice. Make sure you know why you are taking this medicine and how often you should take it. If this medicine is used for a condition that is not cancer, like arthritis or psoriasis, it should be taken weekly, NOT daily. Taking this medicine more often than directed can cause serious side effects, even death. Talk to your healthcare provider about safe handling and disposal of this medicine. You may need to take special precautions. Talk to your pediatrician regarding the use of this medicine in children. While this drug may be prescribed for selected conditions, precautions do apply. Overdosage: If you think you have taken too much of this medicine contact a poison control center or emergency room at once. NOTE: This medicine is only for you. Do not share this medicine with others. What if I miss a dose? If you miss a dose, talk with your doctor or health care professional. Do not take double or extra doses. What may interact with this medicine? This medicine may interact with the following medication: -acitretin -aspirin and aspirin-like medicines including salicylates -azathioprine -certain antibiotics like penicillins, tetracycline, and chloramphenicol -cyclosporine -gold -hydroxychloroquine -live virus vaccines -NSAIDs, medicines for pain and inflammation, like ibuprofen or naproxen -other cytotoxic agents -penicillamine -phenylbutazone -phenytoin -probenecid -retinoids such as isotretinoin and tretinoin -steroid medicines like prednisone or cortisone -sulfonamides like sulfasalazine and trimethoprim/sulfamethoxazole -theophylline This list may not describe all possible interactions. Give your health care provider a list of all the medicines, herbs, non-prescription drugs, or dietary supplements you use. Also tell them if you smoke, drink alcohol, or use illegal drugs. Some items may interact with your  medicine. What should I watch for while using this medicine? Avoid alcoholic  drinks. This medicine can make you more sensitive to the sun. Keep out of the sun. If you cannot avoid being in the sun, wear protective clothing and use sunscreen. Do not use sun lamps or tanning beds/booths. You may need blood work done while you are taking this medicine. Call your doctor or health care professional for advice if you get a fever, chills or sore throat, or other symptoms of a cold or flu. Do not treat yourself. This drug decreases your body's ability to fight infections. Try to avoid being around people who are sick. This medicine may increase your risk to bruise or bleed. Call your doctor or health care professional if you notice any unusual bleeding. Check with your doctor or health care professional if you get an attack of severe diarrhea, nausea and vomiting, or if you sweat a lot. The loss of too much body fluid can make it dangerous for you to take this medicine. Talk to your doctor about your risk of cancer. You may be more at risk for certain types of cancers if you take this medicine. Both men and women must use effective birth control with this medicine. Do not become pregnant while taking this medicine or until at least 1 normal menstrual cycle has occurred after stopping it. Women should inform their doctor if they wish to become pregnant or think they might be pregnant. Men should not father a child while taking this medicine and for 3 months after stopping it. There is a potential for serious side effects to an unborn child. Talk to your health care professional or pharmacist for more information. Do not breast-feed an infant while taking this medicine. What side effects may I notice from receiving this medicine? Side effects that you should report to your doctor or health care professional as soon as possible: -allergic reactions like skin rash, itching or hives, swelling of the face, lips, or tongue -breathing problems or shortness of breath -diarrhea -dry,  nonproductive cough -low blood counts - this medicine may decrease the number of white blood cells, red blood cells and platelets. You may be at increased risk for infections and bleeding. -mouth sores -redness, blistering, peeling or loosening of the skin, including inside the mouth -signs of infection - fever or chills, cough, sore throat, pain or trouble passing urine -signs and symptoms of bleeding such as bloody or black, tarry stools; red or dark-brown urine; spitting up blood or brown material that looks like coffee grounds; red spots on the skin; unusual bruising or bleeding from the eye, gums, or nose -signs and symptoms of kidney injury like trouble passing urine or change in the amount of urine -signs and symptoms of liver injury like dark yellow or brown urine; general ill feeling or flu-like symptoms; light-colored stools; loss of appetite; nausea; right upper belly pain; unusually weak or tired; yellowing of the eyes or skin Side effects that usually do not require medical attention (report to your doctor or health care professional if they continue or are bothersome): -dizziness -hair loss -tiredness -upset stomach -vomiting This list may not describe all possible side effects. Call your doctor for medical advice about side effects. You may report side effects to FDA at 1-800-FDA-1088. Where should I keep my medicine? Keep out of the reach of children. Store at room temperature between 20 and 25 degrees C (68 and 77 degrees F). Protect from light. Throw away any unused  medicine after the expiration date. NOTE: This sheet is a summary. It may not cover all possible information. If you have questions about this medicine, talk to your doctor, pharmacist, or health care provider.  2018 Elsevier/Gold Standard (2015-03-10 05:39:22)

## 2017-01-04 NOTE — Progress Notes (Signed)
Pharmacy Note  Subjective: Patient presents today to the [facility] to see Dr. Estanislado Pandy.  Patient is currently taking methotrexate 10 mg by mouth weekly and folic acid 1 mg by mouth daily, prescribed by previous rheumatologist, Dr. Charlestine Night.  Patient seen by the pharmacist for counseling on methotrexate.    Objective: CBC    Component Value Date/Time   WBC 7.3 11/22/2016 1723   RBC 3.53 (L) 11/22/2016 1723   HGB 10.8 (L) 11/22/2016 1723   HCT 33.4 (L) 11/22/2016 1723   PLT 258.0 11/22/2016 1723   MCV 94.7 11/22/2016 1723   MCH 30.4 10/01/2016 0732   MCHC 32.3 11/22/2016 1723   RDW 15.1 11/22/2016 1723   LYMPHSABS 2.4 11/22/2016 1723   MONOABS 0.3 11/22/2016 1723   EOSABS 0.1 11/22/2016 1723   BASOSABS 0.1 11/22/2016 1723   CMP     Component Value Date/Time   NA 141 11/22/2016 1723   K 4.3 11/22/2016 1723   CL 106 11/22/2016 1723   CO2 28 11/22/2016 1723   GLUCOSE 99 11/22/2016 1723   BUN 22 11/22/2016 1723   CREATININE 1.62 (H) 11/22/2016 1723   CALCIUM 10.2 11/22/2016 1723   PROT 6.2 (L) 09/28/2016 0309   ALBUMIN 2.7 (L) 09/28/2016 0309   AST 29 09/28/2016 0309   ALT 22 09/28/2016 0309   ALKPHOS 51 09/28/2016 0309   BILITOT 0.4 09/28/2016 0309   GFRNONAA 43 (L) 10/03/2016 0757   GFRAA 50 (L) 10/03/2016 0757   TB Gold: ordered today Hepatitis panel: ordered today HIV: non reactive (09/27/16)  Chest-xray:  10/19/16 "IMPRESSION: No discrete infiltrate. Minimal interstitial prominence in both lower lobes is present and likely reflects known sarcoidosis."  Contraception: Discussed   Alcohol use: None  Assessment/Plan:  Patient was continued on methotrexate 10 mg weekly and folic acid 1 mg daily.  Patient was counseled on the purpose, proper use, and adverse effects of methotrexate including nausea, infection, and signs and symptoms of pneumonitis.  Discussed the importance of frequent monitoring of kidney and liver function and blood counts, and  provided patient with standing lab instructions.  Counseled patient to avoid NSAIDs and alcohol while on methotrexate.  Provided patient with educational materials on methotrexate and answered all questions.  Advised patient to get annual influenza vaccine and to get a pneumococcal vaccine if patient has not already had one.  Patient voiced understanding.  Patient consented to methotrexate use.  Will upload into chart.    Elisabeth Most, Pharm.D., BCPS Clinical Pharmacist Pager: [phone] Phone: [phone] 01/04/2017 8:56 AM

## 2017-01-05 LAB — COMPLETE METABOLIC PANEL WITH GFR
ALT: 16 U/L (ref 9–46)
AST: 22 U/L (ref 10–35)
Albumin: 4.5 g/dL (ref 3.6–5.1)
Alkaline Phosphatase: 56 U/L (ref 40–115)
BILIRUBIN TOTAL: 0.7 mg/dL (ref 0.2–1.2)
BUN: 19 mg/dL (ref 7–25)
CO2: 23 mmol/L (ref 20–31)
Calcium: 10.1 mg/dL (ref 8.6–10.3)
Chloride: 103 mmol/L (ref 98–110)
Creat: 1.8 mg/dL — ABNORMAL HIGH (ref 0.70–1.33)
GFR, EST NON AFRICAN AMERICAN: 42 mL/min — AB (ref >=60)
GFR, Est African American: 49 mL/min — ABNORMAL LOW (ref >=60)
GLUCOSE: 94 mg/dL (ref 65–99)
Potassium: 4.1 mmol/L (ref 3.5–5.3)
SODIUM: 140 mmol/L (ref 135–146)
TOTAL PROTEIN: 7.3 g/dL (ref 6.1–8.1)

## 2017-01-05 LAB — HEPATITIS PANEL, ACUTE
HCV Ab: NEGATIVE
HEP B S AG: NEGATIVE
Hep A IgM: NONREACTIVE
Hep B C IgM: NONREACTIVE

## 2017-01-05 LAB — VITAMIN D 25 HYDROXY (VIT D DEFICIENCY, FRACTURES): Vit D, 25-Hydroxy: 33 ng/mL (ref 30–100)

## 2017-01-05 LAB — SEDIMENTATION RATE: Sed Rate: 5 mm/hr (ref 0–20)

## 2017-01-05 LAB — IGG, IGA, IGM
IgA: 121 mg/dL (ref 81–463)
IgG (Immunoglobin G), Serum: 1428 mg/dL (ref 694–1618)
IgM, Serum: 215 mg/dL (ref 48–271)

## 2017-01-05 LAB — CK: Total CK: 159 U/L (ref 44–196)

## 2017-01-06 LAB — PROTEIN ELECTROPHORESIS, SERUM, WITH REFLEX
ALBUMIN ELP: 4.4 g/dL (ref 3.8–4.8)
ALPHA-1-GLOBULIN: 0.3 g/dL (ref 0.2–0.3)
Alpha-2-Globulin: 0.6 g/dL (ref 0.5–0.9)
Beta 2: 0.4 g/dL (ref 0.2–0.5)
Beta Globulin: 0.4 g/dL (ref 0.4–0.6)
Gamma Globulin: 1.3 g/dL (ref 0.8–1.7)
Total Protein, Serum Electrophoresis: 7.3 g/dL (ref 6.1–8.1)

## 2017-01-06 LAB — QUANTIFERON TB GOLD ASSAY (BLOOD)
INTERFERON GAMMA RELEASE ASSAY: NEGATIVE
MITOGEN-NIL SO: 3.85 [IU]/mL
QUANTIFERON NIL VALUE: 0.04 [IU]/mL
Quantiferon Tb Ag Minus Nil Value: 0 IU/mL

## 2017-01-06 NOTE — Progress Notes (Signed)
Within normal limits

## 2017-01-10 LAB — IFE INTERPRETATION

## 2017-01-10 NOTE — Progress Notes (Signed)
Cr is high but stable. Will discuss ay fu visit

## 2017-01-30 NOTE — Progress Notes (Deleted)
Office Visit Note  Patient: Mike Cruz             Date of Birth: Nov 05, 1965           MRN: [id number]             PCP: Josetta Huddle, MD Referring: Josetta Huddle, MD Visit Date: 02/01/2017 Occupation: @GUAROCC @    Subjective:  No chief complaint on file.   History of Present Illness: Mike Cruz is a 51 y.o. male ***   Activities of Daily Living:  Patient reports morning stiffness for *** {minute/hour:19697}.   Patient {ACTIONS;DENIES/REPORTS:21021675::"Denies"} nocturnal pain.  Difficulty dressing/grooming: {ACTIONS;DENIES/REPORTS:21021675::"Denies"} Difficulty climbing stairs: {ACTIONS;DENIES/REPORTS:21021675::"Denies"} Difficulty getting out of chair: {ACTIONS;DENIES/REPORTS:21021675::"Denies"} Difficulty using hands for taps, buttons, cutlery, and/or writing: {ACTIONS;DENIES/REPORTS:21021675::"Denies"}   No Rheumatology ROS completed.   PMFS History:  Patient Active Problem List   Diagnosis Date Noted  . History of hypertension 12/31/2016  . History of anemia 12/31/2016  . High risk medication use 12/31/2016  . History of renal calculi 12/31/2016  . History of prostatitis 12/31/2016  . On prednisone therapy 12/31/2016  . Abnormal serum angiotensin-converting enzyme level 12/31/2016  . Acute kidney injury superimposed on chronic kidney disease ([place]) 11/22/2016  . History of viral pneumonia 11/22/2016  . Anemia 09/27/2016  . Sarcoidosis   . Hypertension     Past Medical History:  Diagnosis Date  . Acute renal failure (ARF) ([place])   . Acute respiratory failure ([place])   . Anemia   . Hypertension   . Hyponatremia   . Kidney stone   . PONV (postoperative nausea and vomiting)    remembers some nausea postop  . Sarcoidosis     Family History  Problem Relation Age of Onset  . Cancer Mother        ovarian  . AAA (abdominal aortic aneurysm) Father   . Cancer Father        prostate and kidney  . Stroke Sister    Past Surgical History:  Procedure  Laterality Date  . HEAD & NECK SKIN LESION EXCISIONAL BIOPSY    . KNEE SURGERY Left    Social History   Social History Narrative  . No narrative on file     Objective: Vital Signs: There were no vitals taken for this visit.   Physical Exam   Musculoskeletal Exam: ***  CDAI Exam: No CDAI exam completed.    Investigation: No additional findings. 01/04/2017 CBC hemoglobin 12.7, CMP creatinine 1.8, GFR 42, ESR 5, CK 159, TSH normal, vitamin D 33, IFE showed a poorly defined band of restricted protein mobility reactive to IgG and lambda anti-sera, immunoglobulins normal, hepatitis panel negative, TB gold negative,  Imaging: Mr Abdomen Wwo Contrast  Result Date: 01/05/2017 CLINICAL DATA:  Complex renal cyst EXAM: MRI ABDOMEN WITHOUT AND WITH CONTRAST TECHNIQUE: Multiplanar multisequence MR imaging of the abdomen was performed both before and after the administration of intravenous contrast. CONTRAST:  22m MULTIHANCE GADOBENATE DIMEGLUMINE 529 MG/ML IV SOLN COMPARISON:  Renal ultrasound dated 12/17/2016. CT abdomen/ pelvis dated 03/11/2016. FINDINGS: Lower chest: Lung bases are clear. Hepatobiliary: Liver is within normal limits. No suspicious/enhancing hepatic lesions. No hepatic steatosis. Gallbladder is unremarkable. No intrahepatic or extrahepatic duct dilatation. Pancreas: Two nonaggressive pancreatic cystic lesions measuring up to 10 mm in the pancreatic tail (series 5/image 13). Additional 7 mm nonaggressive cyst in the uncinate process (series 5/image 22). These are favored to reflect a small pseudocyst versus side branch IPMNs. No associated main pancreatic duct dilatation. Spleen:  Within  normal limits. Adrenals/Urinary Tract:  Adrenal glands are within normal limits. 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 8 mm  hyperdense lesion along the posterior right lower kidney (series 10/image 56), likely reflecting a benign hemorrhagic cyst (Bosniak II). Additional simple renal cysts bilaterally, benign (Bosniak I). No hydronephrosis. Stomach/Bowel: Stomach is within normal limits. Visualized bowel is unremarkable. Vascular/Lymphatic:  No evidence of abdominal aortic aneurysm. Single left renal artery and vein.  No renal vein invasion. Small upper abdominal lymph nodes, including a 16 mm peripancreatic node (series 5/image 12) and a 21 mm portacaval node (series 5/image 15), mildly improved from 2017 CT. Other:  No abdominal ascites. Musculoskeletal: No focal osseous lesions. 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 Single left renal artery and vein. No renal vein invasion. No evidence of metastatic disease. **An incidental finding of potential clinical significance has been found. Small nonaggressive pancreatic cystic lesions measuring up to 10 mm. Recommend follow up pre and post contrast MRI/MRCP or pancreatic protocol CT in 1 year. This recommendation follows ACR consensus guidelines: Management of Incidental Pancreatic Cysts: A White Paper of the ACR Incidental Findings Committee. [place] 5146;04:[id number].** Small upper abdominal lymph nodes, decreased from 2017 CT, likely reactive/related to known sarcoidosis. Additional ancillary findings as above. Electronically Signed   By: Julian Hy M.D.   On: 01/05/2017 08:47    Speciality Comments: No specialty comments available.    Procedures:  No procedures performed Allergies: Penicillins   Assessment / Plan:     Visit Diagnoses: No diagnosis found.    Orders: No orders of the defined types were placed in this encounter.  No orders of the defined types were placed in this encounter.   Face-to-face time spent with patient was ***  minutes. 50% of time was spent in counseling and coordination of care.  Follow-Up Instructions: No Follow-up on file.   Bo Merino, MD  Note - This record has been created using Editor, commissioning.  Chart creation errors have been sought, but may not always  have been located. Such creation errors do not reflect on  the standard of medical care.

## 2017-02-01 ENCOUNTER — Ambulatory Visit: Payer: Self-pay | Admitting: Rheumatology

## 2017-02-25 ENCOUNTER — Other Ambulatory Visit: Payer: Self-pay

## 2017-02-25 ENCOUNTER — Telehealth: Payer: Self-pay | Admitting: Rheumatology

## 2017-02-25 DIAGNOSIS — D869 Sarcoidosis, unspecified: Secondary | ICD-10-CM

## 2017-02-25 DIAGNOSIS — R5383 Other fatigue: Secondary | ICD-10-CM

## 2017-02-25 LAB — CBC WITH DIFFERENTIAL/PLATELET
Basophils Absolute: 0 cells/uL (ref 0–200)
Basophils Relative: 0 %
EOS ABS: 89 {cells}/uL (ref 15–500)
Eosinophils Relative: 1 %
HEMATOCRIT: 36.9 % — AB (ref 38.5–50.0)
Hemoglobin: 12.1 g/dL — ABNORMAL LOW (ref 13.2–17.1)
LYMPHS PCT: 21 %
Lymphs Abs: 1869 cells/uL (ref 850–3900)
MCH: 30 pg (ref 27.0–33.0)
MCHC: 32.8 g/dL (ref 32.0–36.0)
MCV: 91.3 fL (ref 80.0–100.0)
MONO ABS: 534 {cells}/uL (ref 200–950)
MPV: 12 fL (ref 7.5–12.5)
Monocytes Relative: 6 %
NEUTROS PCT: 72 %
Neutro Abs: 6408 cells/uL (ref 1500–7800)
Platelets: 177 10*3/uL (ref 140–400)
RBC: 4.04 MIL/uL — ABNORMAL LOW (ref 4.20–5.80)
RDW: 14 % (ref 11.0–15.0)
WBC: 8.9 10*3/uL (ref 3.8–10.8)

## 2017-02-25 NOTE — Telephone Encounter (Signed)
Patient had a Bone Density Study done, and would like to know the results of that test. Please call patient to advise.

## 2017-02-26 LAB — COMPLETE METABOLIC PANEL WITH GFR
ALT: 16 U/L (ref 9–46)
AST: 21 U/L (ref 10–35)
Albumin: 4.3 g/dL (ref 3.6–5.1)
Alkaline Phosphatase: 64 U/L (ref 40–115)
BUN: 21 mg/dL (ref 7–25)
CALCIUM: 10 mg/dL (ref 8.6–10.3)
CHLORIDE: 104 mmol/L (ref 98–110)
CO2: 24 mmol/L (ref 20–32)
Creat: 1.78 mg/dL — ABNORMAL HIGH (ref 0.70–1.33)
GFR, EST AFRICAN AMERICAN: 49 mL/min — AB (ref >=60)
GFR, Est Non African American: 43 mL/min — ABNORMAL LOW (ref >=60)
Glucose, Bld: 122 mg/dL — ABNORMAL HIGH (ref 65–99)
POTASSIUM: 4.3 mmol/L (ref 3.5–5.3)
Sodium: 140 mmol/L (ref 135–146)
Total Bilirubin: 0.3 mg/dL (ref 0.2–1.2)
Total Protein: 7.2 g/dL (ref 6.1–8.1)

## 2017-02-26 NOTE — Progress Notes (Signed)
He is on low dose MTX. GFR is stable

## 2017-03-01 NOTE — Telephone Encounter (Signed)
Patient advised Bone Density shows Osteopenia. Patient advised calcium, Vitamin d and exercise.  T-Score -1.0  Patient verbalized understanding.

## 2017-03-10 ENCOUNTER — Other Ambulatory Visit: Payer: Self-pay | Admitting: Urology

## 2017-03-24 ENCOUNTER — Other Ambulatory Visit: Payer: Self-pay | Admitting: Rheumatology

## 2017-03-24 NOTE — Telephone Encounter (Signed)
Last Visit: 01/04/17 Next Visit: 04/07/17 Labs: 02/25/17 Stable   Okay to refill per Dr. Estanislado Pandy

## 2017-03-31 ENCOUNTER — Telehealth: Payer: Self-pay | Admitting: Rheumatology

## 2017-03-31 NOTE — Telephone Encounter (Signed)
Contacted pharmacy and they did not receive prescription. Gave verbal for prescription to be filled. Left message to advise patient.

## 2017-03-31 NOTE — Telephone Encounter (Signed)
Per patient [facility] [address] did not receive MTX refill. Please resend.

## 2017-04-06 NOTE — Progress Notes (Signed)
Office Visit Note  Patient: Mike Cruz             Date of Birth: [birth year]           MRN: [id number]             PCP: Josetta Huddle, MD Referring: Josetta Huddle, MD Visit Date: 04/07/2017 Occupation: _0 @    Subjective:  Medication management.   History of Present Illness: Mike Cruz is a 51 y.o. male with history of sarcoidosis. He states she's been taking methotrexate 4 tablets by mouth every week and prednisone 5 mg by mouth daily. He was recently diagnosed with left sided renal mass with suspicion of renal cancer. He is a schedule to have partial nephrectomy in October 1 week.   Activities of Daily Living:  Patient reports morning stiffness for 5  minutes.   Patient Denies nocturnal pain.  Difficulty dressing/grooming: Denies Difficulty climbing stairs: Denies Difficulty getting out of chair: Denies Difficulty using hands for taps, buttons, cutlery, and/or writing: Denies   Review of Systems  Constitutional: Negative for fatigue, night sweats and weakness ( ).  HENT: Negative for mouth sores, mouth dryness and nose dryness.   Eyes: Negative for redness and dryness.  Respiratory: Negative for cough, shortness of breath and difficulty breathing.   Cardiovascular: Positive for hypertension. Negative for chest pain, palpitations, irregular heartbeat and swelling in legs/feet.  Gastrointestinal: Negative for blood in stool, constipation and diarrhea.  Endocrine: Negative for increased urination.  Genitourinary: Negative for hematuria and urgency.  Musculoskeletal: Negative for arthralgias, joint pain, joint swelling, myalgias, muscle weakness, morning stiffness, muscle tenderness and myalgias.  Skin: Negative for color change, rash, hair loss, nodules/bumps, skin tightness, ulcers and sensitivity to sunlight.  Allergic/Immunologic: Negative for susceptible to infections.  Neurological: Negative for dizziness, fainting, light-headedness, numbness, memory loss and  night sweats.  Hematological: Negative for swollen glands.  Psychiatric/Behavioral: Negative for depressed mood and sleep disturbance. The patient is not nervous/anxious.     PMFS History:  Patient Active Problem List   Diagnosis Date Noted  . History of hypertension 12/31/2016  . History of anemia 12/31/2016  . High risk medication use 12/31/2016  . History of renal calculi 12/31/2016  . History of prostatitis 12/31/2016  . On prednisone therapy 12/31/2016  . Abnormal serum angiotensin-converting enzyme level 12/31/2016  . Acute kidney injury superimposed on chronic kidney disease ([place]) 11/22/2016  . History of viral pneumonia 11/22/2016  . Anemia 09/27/2016  . Sarcoidosis   . Hypertension     Past Medical History:  Diagnosis Date  . Acute renal failure (ARF) ([place])   . Acute respiratory failure ([place])   . Anemia   . Hypertension   . Hyponatremia   . Kidney stone   . PONV (postoperative nausea and vomiting)    remembers some nausea postop  . Sarcoidosis     Family History  Problem Relation Age of Onset  . Cancer Mother        ovarian  . AAA (abdominal aortic aneurysm) Father   . Cancer Father        prostate and kidney  . Stroke Sister    Past Surgical History:  Procedure Laterality Date  . HEAD & NECK SKIN LESION EXCISIONAL BIOPSY    . KNEE SURGERY Left    Social History   Social History Narrative  . No narrative on file     Objective: Vital Signs: BP 134/81   Pulse 63   Resp 12   Ht  5' 8.5" (1.74 m)   Wt 245 lb (111.1 kg)   BMI 36.71 kg/m    Physical Exam  Constitutional: He is oriented to person, place, and time. He appears well-developed and well-nourished.  HENT:  Head: Normocephalic and atraumatic.  Eyes: Pupils are equal, round, and reactive to light. Conjunctivae and EOM are normal.  Neck: Normal range of motion. Neck supple.  Cardiovascular: Normal rate, regular rhythm and normal heart sounds.   Pulmonary/Chest: Effort normal and breath  sounds normal.  Abdominal: Soft. Bowel sounds are normal.  Neurological: He is alert and oriented to person, place, and time.  Skin: Skin is warm and dry. Capillary refill takes less than 2 seconds.  Psychiatric: He has a normal mood and affect. His behavior is normal.  Nursing note and vitals reviewed.    Musculoskeletal Exam: C-spine and thoracic lumbar spine good range of motion. Shoulder joints elbow joints wrist joint MCPs PIPs DIPs are good range of motion. Hip joints knee joints ankles MTPs PIPs with good range of motion with no synovitis.  CDAI Exam: No CDAI exam completed.    Investigation: No additional findings.  02/25/2017 CBC hemoglobin 12.1, CMP creatinine 1.78, GFR 43, CK 159, TSH 2.32, ESR 5, vitamin D 33 Hep panel negative TB gold negative, IFE-a poorly defined area of restricted protein mobility reactive with IgG and lambda antisera Imaging: No results found.  Speciality Comments: No specialty comments available. MRI of abdomen was reviewed   Procedures:  No procedures performed Allergies: Penicillins   Assessment / Plan:     Visit Diagnoses: Sarcoidosis - Positive Ace, hilar lymphadenopathy, positive lymph node biopsy. Diagnosed and treated by Dr. Charlestine Night with long-term prednisone and methotrexate. He has been on tapering dose of prednisone. I plan to taper his prednisone starting November by 1 mg every 2 months.  High risk medication use - Methotrexate 4 tablets by mouth every week, folic acid 1 mg by mouth daily, prednisone 10 mg 1/2 tablet po qd. He has high creatinine but is stable. We will continue to monitor labs every 3 months.  Chronic kidney disease (CKD) stage G3a: GFR is elevated but stable.  Essential hypertension: His pressure is better today.  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 He is scheduled to have surgery in the first week of October. I've advised him to stop his methotrexate 1 week prior to surgery and then resume 1  week after surgery.  History of renal calculi  History of anemia  Anemia  Abnormal bone density screening - 03/10/2017 T score -1.0 right femoral neck . He may discontinue Fosamax as his bone density is normal.   Orders: No orders of the defined types were placed in this encounter.  No orders of the defined types were placed in this encounter.   Face-to-face time spent with patient was 30 minutes. Greater than 50% of time was spent in counseling and coordination of care.  Follow-Up Instructions: Return in about 6 months (around 10/05/2017) for Sarcoidosis.   Bo Merino, MD  Note - This record has been created using Editor, commissioning.  Chart creation errors have been sought, but may not always  have been located. Such creation errors do not reflect on  the standard of medical care.

## 2017-04-07 ENCOUNTER — Encounter: Payer: Self-pay | Admitting: Rheumatology

## 2017-04-07 ENCOUNTER — Ambulatory Visit (INDEPENDENT_AMBULATORY_CARE_PROVIDER_SITE_OTHER): Payer: BC Managed Care – PPO | Admitting: Rheumatology

## 2017-04-07 VITALS — BP 134/81 | HR 63 | Resp 12 | Ht 68.5 in | Wt 245.0 lb

## 2017-04-07 DIAGNOSIS — I10 Essential (primary) hypertension: Secondary | ICD-10-CM | POA: Diagnosis not present

## 2017-04-07 DIAGNOSIS — Z87442 Personal history of urinary calculi: Secondary | ICD-10-CM

## 2017-04-07 DIAGNOSIS — Z79899 Other long term (current) drug therapy: Secondary | ICD-10-CM

## 2017-04-07 DIAGNOSIS — D869 Sarcoidosis, unspecified: Secondary | ICD-10-CM | POA: Diagnosis not present

## 2017-04-07 DIAGNOSIS — N2889 Other specified disorders of kidney and ureter: Secondary | ICD-10-CM | POA: Diagnosis not present

## 2017-04-07 DIAGNOSIS — R937 Abnormal findings on diagnostic imaging of other parts of musculoskeletal system: Secondary | ICD-10-CM

## 2017-04-07 DIAGNOSIS — D649 Anemia, unspecified: Secondary | ICD-10-CM

## 2017-04-07 DIAGNOSIS — Z862 Personal history of diseases of the blood and blood-forming organs and certain disorders involving the immune mechanism: Secondary | ICD-10-CM

## 2017-04-07 DIAGNOSIS — N1831 Chronic kidney disease, stage 3a: Secondary | ICD-10-CM

## 2017-04-07 DIAGNOSIS — N183 Chronic kidney disease, stage 3 (moderate): Secondary | ICD-10-CM

## 2017-04-07 NOTE — Patient Instructions (Addendum)
Please stop methotrexate 1 week prior to your surgery. You may resume methotrexate 1 week after the surgery if there are no signs of infection.  Plan is to reduce methotrexate to 4 mg by mouth daily starting November. You can decrease it by 1 mg every 2 months. Please get approval of Dr. Chase Caller regarding this.  Prednisone 4 mg in November and December. Prednisone 3 mg in January and February. Prednisone 2 mg in March and April. Prednisone 1 mg in May and June.  Standing Labs We placed an order today for your standing lab work.    Please come back and get your standing labs in November and every 3 months  We have open lab Monday through Friday from 8:30-11:30 AM and 1:30-4 PM at the office of Dr. Bo Merino.   The office is located at [address], [place], [place], [place] [address] No appointment is necessary.   Labs are drawn by [facility].  You may receive a bill from [facility] for your lab work. If you have any questions regarding directions or hours of operation,  please call [phone].

## 2017-04-12 NOTE — Patient Instructions (Signed)
Mike Cruz  04/12/2017   Your procedure is scheduled on: 04/20/17  Report to [facility] Main  Entrance Take [place]  elevators to 3rd floor to  [place] at     [place] AM.   Call this number if you have problems the morning of surgery [phone]    Remember: ONLY 1 PERSON MAY GO WITH YOU TO SHORT STAY TO GET  READY MORNING OF [place].  Do not  HAVE ANY CLEAR  liquids :After Midnight.   FOLLOW A CLEAR LIQUID DIET PER MD THE DAY BEFORE SURGERY AND DRINK ONE BOTTLE OF MAGNESIUM  CITRATE BY NOON THE DAY BEFORE YOUR SURGERY   Take these medicines the morning of surgery with A SIP OF WATER: NONE                                You may not have any metal on your body including hair pins and              piercings  Do not wear jewelry,  lotions, powders or perfumes, deodorant          .              Men may shave face and neck.   Do not bring valuables to the hospital. [facility].  Contacts, dentures or bridgework may not be worn into surgery.  Leave suitcase in the car. After surgery it may be brought to your room.               Please read over the following fact sheets you were given: _____________________________________________________________________    CLEAR LIQUID DIET   Foods Allowed                                                                     Foods Excluded  Coffee and tea, regular and decaf                             liquids that you cannot  Plain Jell-O in any flavor                                             see through such as: Fruit ices (not with fruit pulp)                                     milk, soups, orange juice  Iced Popsicles                                    All solid food Carbonated beverages, regular and diet  Cranberry, grape and apple juices Sports drinks like Gatorade Lightly seasoned clear broth or consume(fat  free) Sugar, honey syrup  Sample Menu Breakfast                                Lunch                                     Supper Cranberry juice                    Beef broth                            Chicken broth Jell-O                                     Grape juice                           Apple juice Coffee or tea                        Jell-O                                      Popsicle                                                Coffee or tea                        Coffee or tea  _____________________________________________________________________            [facility] - Preparing for Surgery Before surgery, you can play an important role.  Because skin is not sterile, your skin needs to be as free of germs as possible.  You can reduce the number of germs on your skin by washing with CHG (chlorahexidine gluconate) soap before surgery.  CHG is an antiseptic cleaner which kills germs and bonds with the skin to continue killing germs even after washing. Please DO NOT use if you have an allergy to CHG or antibacterial soaps.  If your skin becomes reddened/irritated stop using the CHG and inform your nurse when you arrive at Short Stay. Do not shave (including legs and underarms) for at least 48 hours prior to the first CHG shower.  You may shave your face/neck. Please follow these instructions carefully:  1.  Shower with CHG Soap the night before surgery and the  morning of Surgery.  2.  If you choose to wash your hair, wash your hair first as usual with your  normal  shampoo.  3.  After you shampoo, rinse your hair and body thoroughly to remove the  shampoo.                           4.  Use CHG as you would any other liquid soap.  You can apply chg directly  to the skin and wash                       Gently with a scrungie or clean washcloth.  5.  Apply the CHG Soap to your body ONLY FROM THE NECK DOWN.   Do not use on face/ open                           Wound or open sores.  Avoid contact with eyes, ears mouth and genitals (private parts).                       Wash face,  Genitals (private parts) with your normal soap.             6.  Wash thoroughly, paying special attention to the area where your surgery  will be performed.  7.  Thoroughly rinse your body with warm water from the neck down.  8.  DO NOT shower/wash with your normal soap after using and rinsing off  the CHG Soap.                9.  Pat yourself dry with a clean towel.            10.  Wear clean pajamas.            11.  Place clean sheets on your bed the night of your first shower and do not  sleep with pets. Day of Surgery : Do not apply any lotions/deodorants the morning of surgery.  Please wear clean clothes to the hospital/surgery center.  FAILURE TO FOLLOW THESE INSTRUCTIONS MAY RESULT IN THE CANCELLATION OF YOUR SURGERY PATIENT SIGNATURE_________________________________  NURSE SIGNATURE__________________________________  ________________________________________________________________________  WHAT IS A BLOOD TRANSFUSION? Blood Transfusion Information  A transfusion is the replacement of blood or some of its parts. Blood is made up of multiple cells which provide different functions.  Red blood cells carry oxygen and are used for blood loss replacement.  White blood cells fight against infection.  Platelets control bleeding.  Plasma helps clot blood.  Other blood products are available for specialized needs, such as hemophilia or other clotting disorders. BEFORE THE TRANSFUSION  Who gives blood for transfusions?   Healthy volunteers who are fully evaluated to make sure their blood is safe. This is blood bank blood. Transfusion therapy is the safest it has ever been in the practice of medicine. Before blood is taken from a donor, a complete history is taken to make sure that person has no history of diseases nor engages in risky social behavior (examples are intravenous drug use  or sexual activity with multiple partners). The donor's travel history is screened to minimize risk of transmitting infections, such as malaria. The donated blood is tested for signs of infectious diseases, such as HIV and hepatitis. The blood is then tested to be sure it is compatible with you in order to minimize the chance of a transfusion reaction. If you or a relative donates blood, this is often done in anticipation of surgery and is not appropriate for emergency situations. It takes many days to process the donated blood. RISKS AND COMPLICATIONS Although transfusion therapy is very safe and saves many lives, the main dangers of transfusion include:   Getting an infectious disease.  Developing a transfusion reaction. This is an allergic reaction to something in the blood you were given. Every precaution is taken to  prevent this. The decision to have a blood transfusion has been considered carefully by your caregiver before blood is given. Blood is not given unless the benefits outweigh the risks. AFTER THE TRANSFUSION  Right after receiving a blood transfusion, you will usually feel much better and more energetic. This is especially true if your red blood cells have gotten low (anemic). The transfusion raises the level of the red blood cells which carry oxygen, and this usually causes an energy increase.  The nurse administering the transfusion will monitor you carefully for complications. HOME CARE INSTRUCTIONS  No special instructions are needed after a transfusion. You may find your energy is better. Speak with your caregiver about any limitations on activity for underlying diseases you may have. SEEK MEDICAL CARE IF:   Your condition is not improving after your transfusion.  You develop redness or irritation at the intravenous (IV) site. SEEK IMMEDIATE MEDICAL CARE IF:  Any of the following symptoms occur over the next 12 hours:  Shaking chills.  You have a temperature by mouth  above 102 F (38.9 C), not controlled by medicine.  Chest, back, or muscle pain.  People around you feel you are not acting correctly or are confused.  Shortness of breath or difficulty breathing.  Dizziness and fainting.  You get a rash or develop hives.  You have a decrease in urine output.  Your urine turns a dark color or changes to pink, red, or brown. Any of the following symptoms occur over the next 10 days:  You have a temperature by mouth above 102 F (38.9 C), not controlled by medicine.  Shortness of breath.  Weakness after normal activity.  The white part of the eye turns yellow (jaundice).  You have a decrease in the amount of urine or are urinating less often.  Your urine turns a dark color or changes to pink, red, or brown. Document Released: 07/02/2000 Document Revised: 09/27/2011 Document Reviewed: 02/19/2008 [facility] Patient Information 2014 [place], [place].  _______________________________________________________________________

## 2017-04-13 ENCOUNTER — Encounter (HOSPITAL_COMMUNITY)
Admission: RE | Admit: 2017-04-13 | Discharge: 2017-04-13 | Disposition: A | Discharge status: Home / Self Care | Payer: BC Managed Care – PPO | Source: Ambulatory Visit | Attending: Urology | Admitting: Urology

## 2017-04-13 ENCOUNTER — Encounter (HOSPITAL_COMMUNITY): Payer: Self-pay

## 2017-04-13 DIAGNOSIS — Z01810 Encounter for preprocedural cardiovascular examination: Secondary | ICD-10-CM | POA: Diagnosis not present

## 2017-04-13 DIAGNOSIS — Z01812 Encounter for preprocedural laboratory examination: Secondary | ICD-10-CM | POA: Insufficient documentation

## 2017-04-13 HISTORY — DX: Pneumonia, unspecified organism: J18.9

## 2017-04-13 HISTORY — DX: Personal history of urinary calculi: Z87.442

## 2017-04-13 LAB — CBC
HCT: 34 % — ABNORMAL LOW (ref 39.0–52.0)
Hemoglobin: 11.3 g/dL — ABNORMAL LOW (ref 13.0–17.0)
MCH: 29.7 pg (ref 26.0–34.0)
MCHC: 33.2 g/dL (ref 30.0–36.0)
MCV: 89.2 fL (ref 78.0–100.0)
PLATELETS: 196 10*3/uL (ref 150–400)
RBC: 3.81 MIL/uL — ABNORMAL LOW (ref 4.22–5.81)
RDW: 13.6 % (ref 11.5–15.5)
WBC: 8.3 10*3/uL (ref 4.0–10.5)

## 2017-04-13 LAB — BASIC METABOLIC PANEL
Anion gap: 10 (ref 5–15)
BUN: 23 mg/dL — AB (ref 6–20)
CALCIUM: 10.1 mg/dL (ref 8.9–10.3)
CO2: 23 mmol/L (ref 22–32)
CREATININE: 1.82 mg/dL — AB (ref 0.61–1.24)
Chloride: 109 mmol/L (ref 101–111)
GFR calc Af Amer: 47 mL/min — ABNORMAL LOW (ref >60)
GFR, EST NON AFRICAN AMERICAN: 41 mL/min — AB (ref >60)
Glucose, Bld: 95 mg/dL (ref 65–99)
Potassium: 4.2 mmol/L (ref 3.5–5.1)
SODIUM: 142 mmol/L (ref 135–145)

## 2017-04-13 LAB — ABO/RH: ABO/RH(D): B POS

## 2017-04-13 NOTE — Progress Notes (Signed)
Echo 3/16/18epic lov pulm 12/22/16 epic cxr 10/19/16 epic  ekg 09/27/16 epic borderline prolonged QT repeated at preop PFT 12/10/16 epic

## 2017-04-13 NOTE — Progress Notes (Signed)
BMP  Done 04/13/17 routed to Dr. Tresa  via epic

## 2017-04-19 NOTE — Anesthesia Preprocedure Evaluation (Addendum)
Anesthesia Evaluation  Patient identified by MRN, date of birth, ID band Patient awake    Reviewed: Allergy & Precautions, NPO status , Patient's Chart, lab work & pertinent test results  Airway Mallampati: II  TM Distance: >3 FB Neck ROM: Full    Dental  (+) Dental Advisory Given   Pulmonary  sarcoidosis   breath sounds clear to auscultation       Cardiovascular hypertension, Pt. on medications  Rhythm:Regular Rate:Normal  09/2016: Normal LV size with EF 55-60%. Normal RV size and systolic  function. No significant valvular abnormalities.   Neuro/Psych negative neurological ROS     GI/Hepatic negative GI ROS, Neg liver ROS,   Endo/Other  negative endocrine ROS  Renal/GU Renal disease     Musculoskeletal   Abdominal   Peds  Hematology  (+) anemia ,   Anesthesia Other Findings   Reproductive/Obstetrics                            Lab Results  Component Value Date   WBC 8.3 04/13/2017   HGB 11.3 (L) 04/13/2017   HCT 34.0 (L) 04/13/2017   MCV 89.2 04/13/2017   PLT 196 04/13/2017   Lab Results  Component Value Date   CREATININE 1.82 (H) 04/13/2017   BUN 23 (H) 04/13/2017   NA 142 04/13/2017   K 4.2 04/13/2017   CL 109 04/13/2017   CO2 23 04/13/2017  ;  Anesthesia Physical Anesthesia Plan  ASA: III  Anesthesia Plan: General   Post-op Pain Management:    Induction: Intravenous  PONV Risk Score and Plan: 2 and Ondansetron, Dexamethasone and Treatment may vary due to age or medical condition  Airway Management Planned: Oral ETT  Additional Equipment:   Intra-op Plan:   Post-operative Plan: Extubation in OR  Informed Consent: I have reviewed the patients History and Physical, chart, labs and discussed the procedure including the risks, benefits and alternatives for the proposed anesthesia with the patient or authorized representative who has indicated his/her understanding  and acceptance.   Dental advisory given  Plan Discussed with: CRNA  Anesthesia Plan Comments:        Anesthesia Quick Evaluation

## 2017-04-20 ENCOUNTER — Inpatient Hospital Stay (HOSPITAL_COMMUNITY): Payer: BC Managed Care – PPO | Admitting: Anesthesiology

## 2017-04-20 ENCOUNTER — Encounter (HOSPITAL_COMMUNITY)
Admission: RE | Disposition: A | Discharge status: Home / Self Care | Payer: Self-pay | Source: Ambulatory Visit | Attending: Urology

## 2017-04-20 ENCOUNTER — Encounter (HOSPITAL_COMMUNITY): Payer: Self-pay | Admitting: Anesthesiology

## 2017-04-20 ENCOUNTER — Inpatient Hospital Stay (HOSPITAL_COMMUNITY)
Admission: RE | Admit: 2017-04-20 | Discharge: 2017-04-21 | DRG: 658 | Disposition: A | Discharge status: Home / Self Care | Payer: BC Managed Care – PPO | Source: Ambulatory Visit | Attending: Urology | Admitting: Urology

## 2017-04-20 DIAGNOSIS — C642 Malignant neoplasm of left kidney, except renal pelvis: Secondary | ICD-10-CM | POA: Diagnosis present

## 2017-04-20 DIAGNOSIS — E669 Obesity, unspecified: Secondary | ICD-10-CM | POA: Diagnosis present

## 2017-04-20 DIAGNOSIS — N2889 Other specified disorders of kidney and ureter: Secondary | ICD-10-CM | POA: Diagnosis present

## 2017-04-20 DIAGNOSIS — Z6835 Body mass index (BMI) 35.0-35.9, adult: Secondary | ICD-10-CM | POA: Diagnosis not present

## 2017-04-20 DIAGNOSIS — I10 Essential (primary) hypertension: Secondary | ICD-10-CM | POA: Diagnosis present

## 2017-04-20 DIAGNOSIS — Z23 Encounter for immunization: Secondary | ICD-10-CM | POA: Diagnosis not present

## 2017-04-20 DIAGNOSIS — D869 Sarcoidosis, unspecified: Secondary | ICD-10-CM | POA: Diagnosis present

## 2017-04-20 HISTORY — PX: ROBOTIC ASSITED PARTIAL NEPHRECTOMY: SHX6087

## 2017-04-20 LAB — TYPE AND SCREEN
ABO/RH(D): B POS
Antibody Screen: NEGATIVE

## 2017-04-20 LAB — HEMOGLOBIN AND HEMATOCRIT, BLOOD
HCT: 33.4 % — ABNORMAL LOW (ref 39.0–52.0)
Hemoglobin: 11.3 g/dL — ABNORMAL LOW (ref 13.0–17.0)

## 2017-04-20 SURGERY — NEPHRECTOMY, PARTIAL, ROBOT-ASSISTED
Anesthesia: General | Laterality: Left

## 2017-04-20 MED ORDER — OXYCODONE HCL 5 MG PO TABS
5.0000 mg | ORAL_TABLET | ORAL | Status: DC | PRN
  Administered 2017-04-20 (×2): 5 mg via ORAL
  Filled 2017-04-20 (×2): qty 1

## 2017-04-20 MED ORDER — HYDROCORTISONE NA SUCCINATE PF 100 MG IJ SOLR
INTRAMUSCULAR | Status: DC | PRN
  Administered 2017-04-20: 100 mg via INTRAVENOUS

## 2017-04-20 MED ORDER — DIPHENHYDRAMINE HCL 12.5 MG/5ML PO ELIX: 12.5000 mg | ORAL_SOLUTION | Freq: Four times a day (QID) | ORAL | Status: DC | PRN

## 2017-04-20 MED ORDER — EPHEDRINE 5 MG/ML INJ
INTRAVENOUS | Status: AC
  Filled 2017-04-20: qty 10

## 2017-04-20 MED ORDER — CLINDAMYCIN PHOSPHATE 900 MG/50ML IV SOLN
900.0000 mg | INTRAVENOUS | Status: AC
  Administered 2017-04-20: 900 mg via INTRAVENOUS

## 2017-04-20 MED ORDER — SUFENTANIL CITRATE 50 MCG/ML IV SOLN
INTRAVENOUS | Status: AC
  Filled 2017-04-20: qty 1

## 2017-04-20 MED ORDER — ONDANSETRON HCL 4 MG/2ML IJ SOLN
INTRAMUSCULAR | Status: AC
  Filled 2017-04-20: qty 2

## 2017-04-20 MED ORDER — SODIUM CHLORIDE 0.9 % IJ SOLN
INTRAMUSCULAR | Status: AC
  Filled 2017-04-20: qty 20

## 2017-04-20 MED ORDER — LACTATED RINGERS IV SOLN
INTRAVENOUS | Status: DC | PRN
  Administered 2017-04-20 (×2): via INTRAVENOUS

## 2017-04-20 MED ORDER — ACETAMINOPHEN 500 MG PO TABS
1000.0000 mg | ORAL_TABLET | Freq: Four times a day (QID) | ORAL | Status: AC
  Administered 2017-04-20 – 2017-04-21 (×4): 1000 mg via ORAL
  Filled 2017-04-20 (×4): qty 2

## 2017-04-20 MED ORDER — SUGAMMADEX SODIUM 500 MG/5ML IV SOLN
INTRAVENOUS | Status: AC
  Filled 2017-04-20: qty 5

## 2017-04-20 MED ORDER — SUGAMMADEX SODIUM 200 MG/2ML IV SOLN
INTRAVENOUS | Status: AC
  Filled 2017-04-20: qty 2

## 2017-04-20 MED ORDER — MIDAZOLAM HCL 2 MG/2ML IJ SOLN
INTRAMUSCULAR | Status: AC
  Filled 2017-04-20: qty 2

## 2017-04-20 MED ORDER — HYDROCORTISONE NA SUCCINATE PF 100 MG IJ SOLR
INTRAMUSCULAR | Status: AC
  Filled 2017-04-20: qty 2

## 2017-04-20 MED ORDER — MAGNESIUM CITRATE PO SOLN: 1.0000 | Freq: Once | ORAL | Status: DC

## 2017-04-20 MED ORDER — DIPHENHYDRAMINE HCL 50 MG/ML IJ SOLN: 12.5000 mg | Freq: Four times a day (QID) | INTRAMUSCULAR | Status: DC | PRN

## 2017-04-20 MED ORDER — SODIUM CHLORIDE 0.9 % IJ SOLN
INTRAMUSCULAR | Status: AC
  Filled 2017-04-20: qty 10

## 2017-04-20 MED ORDER — HYDROMORPHONE HCL-NACL 0.5-0.9 MG/ML-% IV SOSY: 0.5000 mg | PREFILLED_SYRINGE | INTRAVENOUS | Status: DC | PRN

## 2017-04-20 MED ORDER — METHOTREXATE 2.5 MG PO TABS
10.0000 mg | ORAL_TABLET | ORAL | Status: DC
  Filled 2017-04-20: qty 4

## 2017-04-20 MED ORDER — PROPOFOL 10 MG/ML IV BOLUS
INTRAVENOUS | Status: DC | PRN
  Administered 2017-04-20: 200 mg via INTRAVENOUS

## 2017-04-20 MED ORDER — PROMETHAZINE HCL 25 MG/ML IJ SOLN: 6.2500 mg | INTRAMUSCULAR | Status: DC | PRN

## 2017-04-20 MED ORDER — PREDNISONE 5 MG PO TABS
5.0000 mg | ORAL_TABLET | Freq: Every day | ORAL | Status: DC
  Administered 2017-04-21: 5 mg via ORAL
  Filled 2017-04-20: qty 1

## 2017-04-20 MED ORDER — CIPROFLOXACIN IN D5W 400 MG/200ML IV SOLN
INTRAVENOUS | Status: AC
  Filled 2017-04-20: qty 200

## 2017-04-20 MED ORDER — BUPIVACAINE LIPOSOME 1.3 % IJ SUSP
20.0000 mL | Freq: Once | INTRAMUSCULAR | Status: AC
  Administered 2017-04-20: 40 mL
  Filled 2017-04-20: qty 20

## 2017-04-20 MED ORDER — CLINDAMYCIN PHOSPHATE 900 MG/50ML IV SOLN
INTRAVENOUS | Status: AC
  Filled 2017-04-20: qty 50

## 2017-04-20 MED ORDER — EPHEDRINE SULFATE-NACL 50-0.9 MG/10ML-% IV SOSY
PREFILLED_SYRINGE | INTRAVENOUS | Status: DC | PRN
  Administered 2017-04-20: 5 mg via INTRAVENOUS

## 2017-04-20 MED ORDER — HYDROCODONE-ACETAMINOPHEN 5-325 MG PO TABS: 1.0000 | ORAL_TABLET | Freq: Four times a day (QID) | ORAL | 0 refills | Status: DC | PRN

## 2017-04-20 MED ORDER — LIDOCAINE 2% (20 MG/ML) 5 ML SYRINGE
INTRAMUSCULAR | Status: AC
  Filled 2017-04-20: qty 5

## 2017-04-20 MED ORDER — ONDANSETRON HCL 4 MG/2ML IJ SOLN
INTRAMUSCULAR | Status: DC | PRN
  Administered 2017-04-20: 4 mg via INTRAVENOUS

## 2017-04-20 MED ORDER — STERILE WATER FOR IRRIGATION IR SOLN
Status: DC | PRN
  Administered 2017-04-20: 1000 mL

## 2017-04-20 MED ORDER — LIDOCAINE 2% (20 MG/ML) 5 ML SYRINGE
INTRAMUSCULAR | Status: DC | PRN
  Administered 2017-04-20: 100 mg via INTRAVENOUS

## 2017-04-20 MED ORDER — SUGAMMADEX SODIUM 200 MG/2ML IV SOLN
INTRAVENOUS | Status: DC | PRN
  Administered 2017-04-20: 250 mg via INTRAVENOUS

## 2017-04-20 MED ORDER — HYDROMORPHONE HCL-NACL 0.5-0.9 MG/ML-% IV SOSY
0.2500 mg | PREFILLED_SYRINGE | INTRAVENOUS | Status: DC | PRN
  Administered 2017-04-20 (×2): 0.5 mg via INTRAVENOUS

## 2017-04-20 MED ORDER — SODIUM CHLORIDE 0.9 % IJ SOLN
INTRAMUSCULAR | Status: DC | PRN
  Administered 2017-04-20: 20 mL

## 2017-04-20 MED ORDER — SUFENTANIL CITRATE 50 MCG/ML IV SOLN
INTRAVENOUS | Status: DC | PRN
  Administered 2017-04-20: 10 ug via INTRAVENOUS
  Administered 2017-04-20: 20 ug via INTRAVENOUS
  Administered 2017-04-20: 10 ug via INTRAVENOUS

## 2017-04-20 MED ORDER — DEXTROSE-NACL 5-0.45 % IV SOLN
INTRAVENOUS | Status: DC
  Administered 2017-04-20 (×2): via INTRAVENOUS

## 2017-04-20 MED ORDER — PROPOFOL 10 MG/ML IV BOLUS
INTRAVENOUS | Status: AC
  Filled 2017-04-20: qty 20

## 2017-04-20 MED ORDER — ROCURONIUM BROMIDE 50 MG/5ML IV SOSY
PREFILLED_SYRINGE | INTRAVENOUS | Status: AC
  Filled 2017-04-20: qty 5

## 2017-04-20 MED ORDER — MIDAZOLAM HCL 2 MG/2ML IJ SOLN
INTRAMUSCULAR | Status: DC | PRN
  Administered 2017-04-20: 2 mg via INTRAVENOUS

## 2017-04-20 MED ORDER — INFLUENZA VAC SPLIT QUAD 0.5 ML IM SUSY
0.5000 mL | PREFILLED_SYRINGE | INTRAMUSCULAR | Status: AC
  Administered 2017-04-21: 0.5 mL via INTRAMUSCULAR
  Filled 2017-04-20: qty 0.5

## 2017-04-20 MED ORDER — DEXAMETHASONE SODIUM PHOSPHATE 10 MG/ML IJ SOLN
INTRAMUSCULAR | Status: AC
  Filled 2017-04-20: qty 1

## 2017-04-20 MED ORDER — HYDROMORPHONE HCL-NACL 0.5-0.9 MG/ML-% IV SOSY
PREFILLED_SYRINGE | INTRAVENOUS | Status: AC
  Filled 2017-04-20: qty 2

## 2017-04-20 MED ORDER — LACTATED RINGERS IR SOLN
Status: DC | PRN
  Administered 2017-04-20: 1000 mL

## 2017-04-20 MED ORDER — CIPROFLOXACIN IN D5W 400 MG/200ML IV SOLN
400.0000 mg | INTRAVENOUS | Status: AC
  Administered 2017-04-20: 400 mg via INTRAVENOUS

## 2017-04-20 MED ORDER — ONDANSETRON HCL 4 MG/2ML IJ SOLN
4.0000 mg | INTRAMUSCULAR | Status: DC | PRN
  Administered 2017-04-20: 4 mg via INTRAVENOUS
  Filled 2017-04-20: qty 2

## 2017-04-20 MED ORDER — ROCURONIUM BROMIDE 10 MG/ML (PF) SYRINGE
PREFILLED_SYRINGE | INTRAVENOUS | Status: DC | PRN
  Administered 2017-04-20: 20 mg via INTRAVENOUS
  Administered 2017-04-20: 50 mg via INTRAVENOUS

## 2017-04-20 SURGICAL SUPPLY — 68 items
APPLICATOR SURGIFLO ENDO (HEMOSTASIS) IMPLANT
CHLORAPREP W/TINT 26ML (MISCELLANEOUS) ×3 IMPLANT
CLIP SUT LAPRA TY ABSORB (SUTURE) ×9 IMPLANT
CLIP VESOLOCK LG 6/CT PURPLE (CLIP) ×3 IMPLANT
CLIP VESOLOCK MED LG 6/CT (CLIP) ×6 IMPLANT
CLIP VESOLOCK XL 6/CT (CLIP) IMPLANT
COVER SURGICAL LIGHT HANDLE (MISCELLANEOUS) ×3 IMPLANT
COVER TIP SHEARS 8 DVNC (MISCELLANEOUS) ×1 IMPLANT
COVER TIP SHEARS 8MM DA VINCI (MISCELLANEOUS) ×2
DECANTER SPIKE VIAL GLASS SM (MISCELLANEOUS) IMPLANT
DERMABOND ADVANCED (GAUZE/BANDAGES/DRESSINGS) ×2
DERMABOND ADVANCED .7 DNX12 (GAUZE/BANDAGES/DRESSINGS) ×1 IMPLANT
DRAIN CHANNEL 15F RND FF 3/16 (WOUND CARE) IMPLANT
DRAPE ARM DVNC X/XI (DISPOSABLE) ×4 IMPLANT
DRAPE COLUMN DVNC XI (DISPOSABLE) ×1 IMPLANT
DRAPE DA VINCI XI ARM (DISPOSABLE) ×8
DRAPE DA VINCI XI COLUMN (DISPOSABLE) ×2
DRAPE INCISE IOBAN 66X45 STRL (DRAPES) ×3 IMPLANT
DRAPE LAPAROSCOPIC ABDOMINAL (DRAPES) ×3 IMPLANT
DRAPE SHEET LG 3/4 BI-LAMINATE (DRAPES) ×3 IMPLANT
ELECT PENCIL ROCKER SW 15FT (MISCELLANEOUS) ×3 IMPLANT
ELECT REM PT RETURN 15FT ADLT (MISCELLANEOUS) ×3 IMPLANT
EVACUATOR SILICONE 100CC (DRAIN) IMPLANT
FLOSEAL 10ML (HEMOSTASIS) ×3 IMPLANT
GLOVE BIO SURGEON STRL SZ 6.5 (GLOVE) ×2 IMPLANT
GLOVE BIO SURGEONS STRL SZ 6.5 (GLOVE) ×1
GLOVE BIOGEL M STRL SZ7.5 (GLOVE) ×6 IMPLANT
GOWN STRL REUS W/TWL LRG LVL3 (GOWN DISPOSABLE) ×6 IMPLANT
HEMOSTAT SURGICEL 4X8 (HEMOSTASIS) ×3 IMPLANT
IRRIG SUCT STRYKERFLOW 2 WTIP (MISCELLANEOUS)
IRRIGATION SUCT STRKRFLW 2 WTP (MISCELLANEOUS) IMPLANT
KIT BASIN OR (CUSTOM PROCEDURE TRAY) ×3 IMPLANT
LOOP VESSEL MAXI BLUE (MISCELLANEOUS) ×3 IMPLANT
MARKER SKIN DUAL TIP RULER LAB (MISCELLANEOUS) ×3 IMPLANT
NEEDLE INSUFFLATION 14GA 120MM (NEEDLE) ×3 IMPLANT
NS IRRIG 1000ML POUR BTL (IV SOLUTION) ×3 IMPLANT
PORT ACCESS TROCAR AIRSEAL 12 (TROCAR) ×1 IMPLANT
PORT ACCESS TROCAR AIRSEAL 5M (TROCAR) ×2
POSITIONER SURGICAL ARM (MISCELLANEOUS) ×6 IMPLANT
POUCH SPECIMEN RETRIEVAL 10MM (ENDOMECHANICALS) ×3 IMPLANT
RELOAD STAPLER WHITE 60MM (STAPLE) IMPLANT
SEAL CANN UNIV 5-8 DVNC XI (MISCELLANEOUS) ×4 IMPLANT
SEAL XI 5MM-8MM UNIVERSAL (MISCELLANEOUS) ×8
SET TRI-LUMEN FLTR TB AIRSEAL (TUBING) ×3 IMPLANT
SOLUTION ELECTROLUBE (MISCELLANEOUS) ×3 IMPLANT
SPONGE LAP 4X18 X RAY DECT (DISPOSABLE) ×3 IMPLANT
STAPLE ECHEON FLEX 60 POW ENDO (STAPLE) IMPLANT
STAPLER RELOAD WHITE 60MM (STAPLE)
SURGIFLO W/THROMBIN 8M KIT (HEMOSTASIS) ×3 IMPLANT
SUT ETHILON 3 0 PS 1 (SUTURE) ×3 IMPLANT
SUT MNCRL AB 4-0 PS2 18 (SUTURE) ×6 IMPLANT
SUT PDS AB 1 CT1 27 (SUTURE) ×6 IMPLANT
SUT V-LOC BARB 180 2/0GR6 GS22 (SUTURE) ×3
SUT VIC AB 0 CT1 27 (SUTURE) ×10
SUT VIC AB 0 CT1 27XBRD ANTBC (SUTURE) ×5 IMPLANT
SUT VIC AB 2-0 SH 27 (SUTURE) ×2
SUT VIC AB 2-0 SH 27X BRD (SUTURE) ×1 IMPLANT
SUT VLOC BARB 180 ABS3/0GR12 (SUTURE) ×3
SUTURE V-LC BRB 180 2/0GR6GS22 (SUTURE) ×1 IMPLANT
SUTURE VLOC BRB 180 ABS3/0GR12 (SUTURE) ×1 IMPLANT
TAPE STRIPS DRAPE STRL (GAUZE/BANDAGES/DRESSINGS) IMPLANT
TOWEL OR 17X26 10 PK STRL BLUE (TOWEL DISPOSABLE) ×3 IMPLANT
TOWEL OR NON WOVEN STRL DISP B (DISPOSABLE) ×3 IMPLANT
TRAY FOLEY W/METER SILVER 16FR (SET/KITS/TRAYS/PACK) ×3 IMPLANT
TRAY LAPAROSCOPIC (CUSTOM PROCEDURE TRAY) ×3 IMPLANT
TROCAR BLADELESS OPT 5 100 (ENDOMECHANICALS) IMPLANT
TROCAR XCEL 12X100 BLDLESS (ENDOMECHANICALS) ×3 IMPLANT
WATER STERILE IRR 1000ML POUR (IV SOLUTION) ×6 IMPLANT

## 2017-04-20 NOTE — Progress Notes (Signed)
Pt arrived to unit from PACU on stretcher, slid self to bed. VSS. Pt A&Ox4. Denies pain/discomfort at present. Pt oriented to callbell and environment. POC discussed.

## 2017-04-20 NOTE — Anesthesia Procedure Notes (Signed)
Procedure Name: Intubation Date/Time: 04/20/2017 8:38 AM Performed by: Danley Danker L Patient Re-evaluated:Patient Re-evaluated prior to induction Oxygen Delivery Method: Circle system utilized Preoxygenation: Pre-oxygenation with 100% oxygen Induction Type: IV induction Ventilation: Mask ventilation without difficulty and Oral airway inserted - appropriate to patient size Laryngoscope Size: Miller and 3 Grade View: Grade I Tube type: Subglottic suction tube Tube size: 8.0 mm Number of attempts: 1 Airway Equipment and Method: Stylet Placement Confirmation: ETT inserted through vocal cords under direct vision,  positive ETCO2 and breath sounds checked- equal and bilateral Secured at: 23 cm Tube secured with: Tape Dental Injury: Teeth and Oropharynx as per pre-operative assessment

## 2017-04-20 NOTE — Transfer of Care (Signed)
Immediate Anesthesia Transfer of Care Note  Patient: Mike Cruz  Procedure(s) Performed: XI ROBOTIC ASSITED PARTIAL NEPHRECTOMY (Left )  Patient Location: PACU  Anesthesia Type:General  Level of Consciousness: awake, alert  and oriented  Airway & Oxygen Therapy: Patient Spontanous Breathing and Patient connected to face mask oxygen  Post-op Assessment: Report given to RN and Post -op Vital signs reviewed and stable  Post vital signs: Reviewed and stable  Last Vitals:  Vitals:   04/20/17 0635  BP: 134/77  Pulse: 64  Resp: 16  Temp: 36.8 C  SpO2: 99%    Last Pain:  Vitals:   04/20/17 0635  TempSrc: Oral      Patients Stated Pain Goal: 4 (41/66/06 3016)  Complications: No apparent anesthesia complications

## 2017-04-20 NOTE — Anesthesia Postprocedure Evaluation (Signed)
Anesthesia Post Note  Patient: Mike Cruz  Procedure(s) Performed: XI ROBOTIC ASSITED PARTIAL NEPHRECTOMY (Left )     Patient location during evaluation: PACU Anesthesia Type: General Level of consciousness: awake and alert Pain management: pain level controlled Vital Signs Assessment: post-procedure vital signs reviewed and stable Respiratory status: spontaneous breathing, nonlabored ventilation, respiratory function stable and patient connected to nasal cannula oxygen Cardiovascular status: blood pressure returned to baseline and stable Postop Assessment: no apparent nausea or vomiting Anesthetic complications: no    Last Vitals:  Vitals:   04/20/17 1200 04/20/17 1210  BP: (!) 146/77 (!) 145/89  Pulse: 64 63  Resp: 16 14  Temp: 36.5 C 36.5 C  SpO2: 100% 99%    Last Pain:  Vitals:   04/20/17 1611  TempSrc:   PainSc: 2                  Tiajuana Amass

## 2017-04-20 NOTE — Interval H&P Note (Signed)
History and Physical Interval Note:  04/20/2017 8:22 AM  Mike Cruz  has presented today for surgery, with the diagnosis of LEFT RENAL MASS  The various methods of treatment have been discussed with the patient and family. After consideration of risks, benefits and other options for treatment, the patient has consented to  Procedure(s): XI ROBOTIC ASSITED PARTIAL NEPHRECTOMY (Left) as a surgical intervention .  The patient's history has been reviewed, patient examined, no change in status, stable for surgery.  I have reviewed the patient's chart and labs.  Questions were answered to the patient's satisfaction.     [name]

## 2017-04-20 NOTE — H&P (Signed)
Mike Cruz is an 51 y.o. male.    Chief Complaint: Pre-Op LEFT Robotic Partial Nephrectomy  HPI:   1 - Enlarging Left Renal Mass - Left mid lateral approx 80% endophytic solid enhancing mass by MRI 2018. Mass is 43mm, solitary. 1 artery / 1 vein left renovascular anatomy and has increased in size on surveillance since 2015.   2 - Stage 3 Renal Insuficiency - Cr 1.8's x years. Has h/o sarcoid. No hydro on imaging x many.   PMH sig for sarcoid (follows Dr. Corena Pilgrim on pred / methotrexate), mild obesity, knee scope. No ischemic CV disese or chest/abd surgeries. 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 His PCP is Marcellus Scott MD.   : Today " Mike Cruz " is seen to proceed with LEFT robotic partial npherectomy for enlarging mass. No interval fevers.    Past Medical History:  Diagnosis Date  . Acute renal failure (ARF) ([place])   . Acute respiratory failure ([place])   . Anemia   . History of kidney stones   . Hypertension   . Hyponatremia   . Kidney stone    kidney mass 04/13/17  . Pneumonia   . Sarcoidosis     Past Surgical History:  Procedure Laterality Date  . HEAD & NECK SKIN LESION EXCISIONAL BIOPSY    . KNEE SURGERY Left    Arthroscopic  . partial nephrectomt     Left 04/20/17 Dr. Tresa Moore    Family History  Problem Relation Age of Onset  . Cancer Mother        ovarian  . AAA (abdominal aortic aneurysm) Father   . Cancer Father        prostate and kidney  . Stroke Sister    Social History:  reports that he has never smoked. He has never used smokeless tobacco. He reports that he does not drink alcohol or use drugs.  Allergies:  Allergies  Allergen Reactions  . Penicillins Hives    Denies airway involvement    Medications Prior to Admission  Medication Sig Dispense Refill  . alendronate (FOSAMAX) 70 MG tablet Take 70 mg by mouth once a week. On Sunday    . Calcium Carb-Cholecalciferol (CALCIUM-VITAMIN D) 500-200 MG-UNIT tablet Take 1 tablet by mouth daily.    . folic acid  (FOLVITE) 1 MG tablet Take 1 mg by mouth daily.    . methotrexate (RHEUMATREX) 2.5 MG tablet TAKE 5 PILLS ON THURSDAY AND FRIDAY EACH WEEK (Patient taking differently: Takes 4 tablets by mouth every week on Thursdays) 40 tablet 2  . Multiple Vitamin (MULTIVITAMIN WITH MINERALS) TABS tablet Take 1 tablet by mouth daily.    . Olmesartan-Amlodipine-HCTZ 40-5-12.5 MG TABS Take 1 tablet by mouth every morning.  3  . predniSONE (DELTASONE) 10 MG tablet TAKE 2 TABLETS DAILY. (Patient taking differently: take 5 mg (1/2 tablet)  by mouth daily) 60 tablet 0    No results found for this or any previous visit (from the past 48 hour(s)). No results found.  Review of Systems  Constitutional: Negative.  Negative for fever.  HENT: Negative.   Respiratory: Negative.   Cardiovascular: Negative.   Gastrointestinal: Negative.   Genitourinary: Negative.   Musculoskeletal: Negative.   Skin: Negative.   Neurological: Negative.   Endo/Heme/Allergies: Negative.   Psychiatric/Behavioral: Negative.     Blood pressure 134/77, pulse 64, temperature 98.2 F (36.8 C), temperature source Oral, resp. rate 16, height 5' 8.5" (1.74 m), weight 108.4 kg (239 lb), SpO2 99 %. Physical Exam  Constitutional:  He appears well-developed.  HENT:  Head: Normocephalic.  Eyes: Pupils are equal, round, and reactive to light.  Neck: Normal range of motion.  Cardiovascular: Normal rate.   Respiratory: Effort normal.  GI: Soft.  Genitourinary:  Genitourinary Comments: No CVAT  Musculoskeletal: Normal range of motion.  Neurological: He is alert.  Skin: Skin is warm.  Psychiatric: He has a normal mood and affect.     Assessment/Plan  1 - Enlarging Left Renal Mass - Proceed as planned with LEFT robotic partial nephrectomy. Risks, benefits, alternatives, expected peri-op course discussed previosly and reiterated today.   2 - Stage 3 Renal Insuficiency - medical renal disease. Surgeyr with nerphon sparring approach to renal  mass will hopefully minimze changes from baseline.   Alexis Frock, MD 04/20/2017, 8:08 AM

## 2017-04-20 NOTE — Brief Op Note (Signed)
04/20/2017  11:00 AM  PATIENT:  Mike Cruz  51 y.o. male  PRE-OPERATIVE DIAGNOSIS:  LEFT RENAL MASS  POST-OPERATIVE DIAGNOSIS:  LEFT RENAL MASS  PROCEDURE:  Procedure(s): XI ROBOTIC ASSITED PARTIAL NEPHRECTOMY (Left)  SURGEON:  Surgeon(s) and Role:    Alexis Frock, MD - Primary  PHYSICIAN ASSISTANT:   ASSISTANTS: Debbrah Alar PA   ANESTHESIA:   local and general  EBL:  Total I/O In: 1000 [I.V.:1000] Out: 50 [Blood:50]  BLOOD ADMINISTERED:none  DRAINS: 1 - JP to bulb, 2 - Foley to gravity   LOCAL MEDICATIONS USED:  MARCAINE     SPECIMEN:  Source of Specimen:  left partial nephrectomy  DISPOSITION OF SPECIMEN:  PATHOLOGY  COUNTS:  YES  TOURNIQUET:  * No tourniquets in log *  DICTATION: .Other Dictation: Dictation Number [id number]  PLAN OF CARE: Admit to inpatient   PATIENT DISPOSITION:  PACU - hemodynamically stable.   Delay start of Pharmacological VTE agent (>24hrs) due to surgical blood loss or risk of bleeding: yes

## 2017-04-20 NOTE — Discharge Instructions (Signed)

## 2017-04-20 NOTE — Interval H&P Note (Signed)
History and Physical Interval Note:  04/20/2017 8:23 AM  Mike Cruz  has presented today for surgery, with the diagnosis of LEFT RENAL MASS  The various methods of treatment have been discussed with the patient and family. After consideration of risks, benefits and other options for treatment, the patient has consented to  Procedure(s): XI ROBOTIC ASSITED PARTIAL NEPHRECTOMY (Left) as a surgical intervention .  The patient's history has been reviewed, patient examined, no change in status, stable for surgery.  I have reviewed the patient's chart and labs.  Questions were answered to the patient's satisfaction.     Ulmer Degen

## 2017-04-21 LAB — BASIC METABOLIC PANEL
ANION GAP: 6 (ref 5–15)
BUN: 16 mg/dL (ref 6–20)
CO2: 26 mmol/L (ref 22–32)
Calcium: 8.5 mg/dL — ABNORMAL LOW (ref 8.9–10.3)
Chloride: 106 mmol/L (ref 101–111)
Creatinine, Ser: 2.15 mg/dL — ABNORMAL HIGH (ref 0.61–1.24)
GFR calc non Af Amer: 33 mL/min — ABNORMAL LOW (ref >60)
GFR, EST AFRICAN AMERICAN: 39 mL/min — AB (ref >60)
GLUCOSE: 115 mg/dL — AB (ref 65–99)
POTASSIUM: 3.6 mmol/L (ref 3.5–5.1)
Sodium: 138 mmol/L (ref 135–145)

## 2017-04-21 LAB — HEMOGLOBIN AND HEMATOCRIT, BLOOD
HEMATOCRIT: 29.9 % — AB (ref 39.0–52.0)
Hemoglobin: 10 g/dL — ABNORMAL LOW (ref 13.0–17.0)

## 2017-04-21 LAB — CREATININE, FLUID (PLEURAL, PERITONEAL, JP DRAINAGE): CREAT FL: 2.2 mg/dL

## 2017-04-21 NOTE — Op Note (Signed)
NAME:  Mike Cruz, Mike Cruz                    ACCOUNT NO.:  MEDICAL RECORD NO.:  [id number]  LOCATION:                                 FACILITY:  PHYSICIAN:  Alexis Frock, MD          DATE OF BIRTH:  DATE OF PROCEDURE: 04/20/2017                              OPERATIVE REPORT   PREOPERATIVE DIAGNOSES:  Left renal mass, enlarging.  PROCEDURE: 1. Robotic left partial nephrectomy. 2. Intraoperative ultrasound with interpretation.  ASSISTANT:  Debbrah Alar, PA.  ESTIMATED BLOOD LOSS:  Nil.  COMPLICATION:  None.  SPECIMEN:  Left partial nephrectomy.  ISCHEMIC TIME:  15 minutes.  FINDINGS: 1. Single artery, single vein, left renovascular anatomy. 2. Approximately 50% exophytic solid left renal mass abutting but not     involving the collecting system by interval ultrasound.  INDICATION:  Mr. Szabo is a very pleasant 51 year old gentleman, who is known to have a small left renal mass.  He has been on surveillance of this that was found to have progressive enlargement.  This was solid and enhancing and concerning for likely localized renal cell carcinoma.  He does have some mild comorbidity that is well controlled with sarcoidosis methotrexate therapy, has a fantastic functional status.  Options were discussed for further management including continued surveillance versus ablative therapies versus surgical extirpation, and he adamantly wished to proceed with left partial nephrectomy.  He does have some renal insufficiency baseline as well.  Therefore, it is agreed that nephron sparing is advantageous.  Informed consent was obtained and placed in the medical record.  PROCEDURE IN DETAIL:  The patient being, Mike Cruz, was verified. Procedure being left robotic partial nephrectomy was confirmed. Procedure was carried out.  Time-out was performed.  Intravenous antibiotics were administered.  General endotracheal anesthesia was introduced.  The patient was placed into a left side  up full flank position, applying 15 degrees of stable flexion, superior arm elevator, axillary roll, sequential compression devices, bottom leg bent, top leg straight.  He was further fashioned to the operating table using 3-inch tape over foam padding across the supraxiphoid chest and his pelvis. Beanbag was deployed.  All bony prominences were padded.  A Foley catheter was placed per urethra to straight drain.  Sterile field was created by prepping and draping the patient's entire left flank and abdomen using chlorhexidine gluconate and a high-flow, low pressure pneumoperitoneum was obtained using Veress technique in the left lower quadrant having passed the aspiration and drop test.  Next, an 8-mm robotic camera port was placed in position approximately 1 handbreadth superior lateral to the umbilicus.  Laparoscopic examination of peritoneal cavity revealed no significant adhesions and no visceral injury.  Additional ports were placed as follows:  Left subcostal 8-mm robotic port, left far lateral 8-mm robotic port approximately 4 fingerbreadths superior medial to the anterior iliac spine, left paramedian inferior robotic port approximately 3 fingerbreadths superior to the pubic ramus and two 12 mm assistant ports sites in the midline, one in the supraumbilical crease and another 2 fingerbreadths above the camera port, the latter being an AirSeal type.  Robot was docked and passed through the electronic checks.  Next, attention was directed at development of retroperitoneum.  Incision was made lateral to the descending colon from the area of the splenic flexure towards the area of the internal ring.  The colon was carefully swept medially.  The lower pole of kidney area was identified and placed on gentle lateral traction.  Dissection was proceeded medial to this.  The ureter and gonadal vessels were encountered, placed on gentle lateral traction. Psoas musculature was identified.   Dissection was proceeded within this triangle superiorly towards the area of the renal hilum.  Renal hilum consisted of single artery, single vein, renovascular anatomy as anticipated.  There was a prominent adrenal vessel that was well visualized.  The artery was circumferentially mobilized approximately 5 mm distal to its insertion at the aorta marked with vessel loop. Attention was directed at identification of the mass.  Dissection was proceeded directly onto the anterior surface of the kidney and the anterior and lateral surfaces were defatted.  The area of the mass was identified in the plane just slightly below the hilar axis as anticipated.  It did appear somewhat larger than originally anticipated. There was no obvious locally-advanced disease.  Also did appear quite solid.  The area of the mass and kidney interface was carefully defatted reducing approximately 2 cm circumferentially.  Interval ultrasound was then performed.  Intraoperative ultrasound corroborated a predominantly solid mass approximately 50% exophytic on the left lateral aspect of the kidney. It appeared that the deep aspect came very close to, but not directly involving the collecting system.  Using a combination of visual and ultrasound cues, the planes for partial nephrectomy was scored on the lateral aspect of the kidney.  Warm ischemia was then achieved using two bulldog clamps on the artery alone and partial nephrectomy was performed using cold scissors keeping what appeared to be a rim of normal tissue with the partial nephrectomy specimen.  This was then set aside.  First layer renorrhaphy was performed using running 3-0 V-Loc suture reapproximating several small venous sinuses.  The plane of dissection came very close to but not involving the collecting system as planned.  Second layer renal artery was performed by placing a bolster along the long axis of partial nephrectomy and placing 4  parenchymal apposition sutures of 0 Vicryl sandwiched between Hem-o-lok and Lapra- Ty, which resulted in excellent parenchymal apposition.  The bulldog clamps were then removed for total warm ischemia time of 15 minutes, there was excellent hemostasis.  All sutures were further tightened and Lapra-Ty was applied where appropriate.  A closed suction drain was brought through the previous left lateral  port site there into the area of the peritoneal cavity.  2 mL of FloSeal were applied to the area of the renorrhaphy.  The vessel loop marking the artery was removed.  Sponge and needle counts were correct.  The perirenal lateral fat was reapproximated using running 2-0 V-Loc bringing this directly over the area of partial nephrectomy.  Robot was undocked.  The previous superior most 12 mm port site was closed at the level of the fascia using Carter-Thomason suture passer and 0 Vicryl under laparoscopic vision.  Incision was retrieved by extending the inferior most assistant port site for distance of approximately 3 cm removing the partial nephrectomy specimen and setting it aside for permanent pathology.  The extraction site was closed at the level of the fascia using figure-of- eight PDS x3, followed by reapproximation of Scarpa's with running Vicryl.  All incision sites were infiltrated with dilute  lyophilized Marcaine and closed at the level of the skin using subcuticular Monocryl followed by Dermabond.  The procedure was then terminated.  The patient tolerated the procedure well.  There were no immediate periprocedural complications.  The patient was taken to the postanesthesia care in stable condition.          ______________________________ Alexis Frock, MD     TM/MEDQ  D:  04/20/2017  T:  04/20/2017  Job:  [id number]

## 2017-04-21 NOTE — Discharge Summary (Signed)
[facility] Discharge Summary  Admit date: 04/20/2017  Discharge date and time: 04/21/17   Discharge to: Home  Discharge Service: Urology  Discharge Attending Physician:  Dr. Tresa Moore  Discharge  Diagnoses: Left renal mass  Secondary Diagnosis: Active Problems:   Renal mass   OR Procedures: Procedure(s): XI ROBOTIC ASSITED PARTIAL NEPHRECTOMY 04/20/2017   Ancillary Procedures: None   Discharge Day Services: The patient was seen and examined by the Urology team both in the morning and immediately prior to discharge.  Vital signs and laboratory values were stable and within normal limits.  The physical exam was benign and unchanged and all surgical wounds were examined.  Discharge instructions were explained and all questions answered.  Subjective  No acute events overnight. Pain Controlled. No fever or chills.  Objective Patient Vitals for the past 8 hrs:  BP Temp Temp src Pulse Resp SpO2  04/21/17 0615 (!) 99/57 99.1 F (37.3 C) Oral (!) 54 18 100 %  04/21/17 0212 (!) 145/69 97.7 F (36.5 C) Oral (!) 59 18 100 %   No intake/output data recorded.  General Appearance:        No acute distress Lungs:                       Normal work of breathing on room air Heart:                                Regular rate and rhythm Abdomen:                         Soft, non-tender, non-distended. Incisions c/d/i. Former JP site dressed with gauze.  Extremities:                      Warm and well perfused   Hospital Course:  The patient underwent a robotic left partial nephrectomy on 04/20/2017.  The patient tolerated the procedure well, was extubated in the OR, and afterwards was taken to the PACU for routine post-surgical care. When stable the patient was transferred to the floor.   The patient did well postoperatively.  The patient's diet was slowly advanced and at the time of discharge was tolerating a regular diet.  The patient was discharged home 1 Day Post-Op, at which point was  tolerating a regular solid diet, was able to void spontaneously, have adequate pain control with P.O. pain medication, and could ambulate without difficulty. The JP drain was checked for urine - negative, removed prior to discharge.  The patient will follow up with [place] for post op check.   Condition at Discharge: Improved  Discharge Medications:  Allergies as of 04/21/2017      Reactions   Penicillins Hives   Denies airway involvement      Medication List    STOP taking these medications   multivitamin with minerals Tabs tablet     TAKE these medications   alendronate 70 MG tablet Commonly known as:  FOSAMAX Take 70 mg by mouth once a week. On Sunday   calcium-vitamin D 500-200 MG-UNIT tablet Take 1 tablet by mouth daily.   folic acid 1 MG tablet Commonly known as:  FOLVITE Take 1 mg by mouth daily.   HYDROcodone-acetaminophen 5-325 MG tablet Commonly known as:  NORCO Take 1-2 tablets by mouth every 6 (six) hours as needed for moderate pain or severe pain.  methotrexate 2.5 MG tablet Commonly known as:  RHEUMATREX TAKE 5 PILLS ON THURSDAY AND FRIDAY EACH WEEK What changed:  See the new instructions.   Olmesartan-Amlodipine-HCTZ 40-5-12.5 MG Tabs Take 1 tablet by mouth every morning.   predniSONE 10 MG tablet Commonly known as:  DELTASONE TAKE 2 TABLETS DAILY. What changed:  See the new instructions.

## 2017-04-21 NOTE — Progress Notes (Signed)
Patient discharged home with son, discharge instructions/prescription given and explained to patient, he verbalized understanding, denies any pain/distress; surgical incision clean/dry/intact, no sign of infection noted. Accompanied home by son.

## 2017-04-22 ENCOUNTER — Other Ambulatory Visit: Payer: Self-pay | Admitting: Rheumatology

## 2017-04-22 NOTE — Telephone Encounter (Signed)
Last Visit: 04/07/17 Next Visit: 10/12/17  Okay to refill per Dr. Estanislado Pandy

## 2017-05-24 ENCOUNTER — Other Ambulatory Visit: Payer: Self-pay

## 2017-05-24 ENCOUNTER — Telehealth: Payer: Self-pay

## 2017-05-24 DIAGNOSIS — R5383 Other fatigue: Secondary | ICD-10-CM

## 2017-05-24 DIAGNOSIS — D869 Sarcoidosis, unspecified: Secondary | ICD-10-CM

## 2017-05-24 LAB — COMPLETE METABOLIC PANEL WITH GFR
AG Ratio: 1.3 (calc) (ref 1.0–2.5)
ALT: 18 U/L (ref 9–46)
AST: 24 U/L (ref 10–35)
Albumin: 4 g/dL (ref 3.6–5.1)
Alkaline phosphatase (APISO): 57 U/L (ref 40–115)
BILIRUBIN TOTAL: 0.6 mg/dL (ref 0.2–1.2)
BUN/Creatinine Ratio: 9 (calc) (ref 6–22)
BUN: 17 mg/dL (ref 7–25)
CALCIUM: 9.6 mg/dL (ref 8.6–10.3)
CHLORIDE: 104 mmol/L (ref 98–110)
CO2: 27 mmol/L (ref 20–32)
CREATININE: 1.88 mg/dL — AB (ref 0.70–1.33)
GFR, EST AFRICAN AMERICAN: 46 mL/min/{1.73_m2} — AB (ref >=60)
GFR, Est Non African American: 40 mL/min/{1.73_m2} — ABNORMAL LOW (ref >=60)
GLUCOSE: 95 mg/dL (ref 65–99)
Globulin: 3 g/dL (calc) (ref 1.9–3.7)
Potassium: 3.7 mmol/L (ref 3.5–5.3)
Sodium: 138 mmol/L (ref 135–146)
TOTAL PROTEIN: 7 g/dL (ref 6.1–8.1)

## 2017-05-24 LAB — CBC WITH DIFFERENTIAL/PLATELET
BASOS ABS: 51 {cells}/uL (ref 0–200)
Basophils Relative: 0.8 %
EOS PCT: 1.9 %
Eosinophils Absolute: 122 cells/uL (ref 15–500)
HEMATOCRIT: 31.7 % — AB (ref 38.5–50.0)
HEMOGLOBIN: 9.9 g/dL — AB (ref 13.2–17.1)
LYMPHS ABS: 1600 {cells}/uL (ref 850–3900)
MCH: 28.9 pg (ref 27.0–33.0)
MCHC: 31.2 g/dL — AB (ref 32.0–36.0)
MCV: 92.7 fL (ref 80.0–100.0)
MPV: 11.4 fL (ref 7.5–12.5)
Monocytes Relative: 8.2 %
NEUTROS ABS: 4102 {cells}/uL (ref 1500–7800)
Neutrophils Relative %: 64.1 %
Platelets: 180 10*3/uL (ref 140–400)
RBC: 3.42 10*6/uL — AB (ref 4.20–5.80)
RDW: 13 % (ref 11.0–15.0)
Total Lymphocyte: 25 %
WBC: 6.4 10*3/uL (ref 3.8–10.8)
WBCMIX: 525 {cells}/uL (ref 200–950)

## 2017-05-24 MED ORDER — PREDNISONE 1 MG PO TABS: 4.0000 mg | ORAL_TABLET | Freq: Every day | ORAL | 0 refills | Status: DC

## 2017-05-24 NOTE — Telephone Encounter (Signed)
Patient is tapering prednisone by 1 mg every 2 months. Patient is currently on 4 mg. Prescription for Prednisone 4 mg sent the pharmacy.

## 2017-05-24 NOTE — Telephone Encounter (Signed)
Patient stated that his Prednisone Rx needed to be changed from 10mg  to 1mg .  CB# is [phone].  Please advise.  Thank You.

## 2017-05-24 NOTE — Addendum Note (Signed)
Addended by: Carole Binning on: 05/24/2017 10:01 AM   Modules accepted: Orders

## 2017-05-25 NOTE — Progress Notes (Signed)
stable °

## 2017-07-21 ENCOUNTER — Other Ambulatory Visit: Payer: Self-pay | Admitting: Rheumatology

## 2017-07-21 NOTE — Telephone Encounter (Signed)
Last Visit: 04/07/17 Next Visit: 10/12/17  Okay to refill per Dr. Estanislado Pandy

## 2017-08-24 ENCOUNTER — Other Ambulatory Visit (INDEPENDENT_AMBULATORY_CARE_PROVIDER_SITE_OTHER): Payer: Self-pay | Admitting: *Deleted

## 2017-08-24 DIAGNOSIS — R5382 Chronic fatigue, unspecified: Secondary | ICD-10-CM

## 2017-08-24 DIAGNOSIS — M2550 Pain in unspecified joint: Secondary | ICD-10-CM

## 2017-08-24 LAB — CBC WITH DIFFERENTIAL/PLATELET
BASOS ABS: 37 {cells}/uL (ref 0–200)
BASOS PCT: 0.6 %
EOS PCT: 2.9 %
Eosinophils Absolute: 180 cells/uL (ref 15–500)
HCT: 35.1 % — ABNORMAL LOW (ref 38.5–50.0)
HEMOGLOBIN: 11.6 g/dL — AB (ref 13.2–17.1)
Lymphs Abs: 2071 cells/uL (ref 850–3900)
MCH: 29.4 pg (ref 27.0–33.0)
MCHC: 33 g/dL (ref 32.0–36.0)
MCV: 88.9 fL (ref 80.0–100.0)
MONOS PCT: 9.3 %
MPV: 11.4 fL (ref 7.5–12.5)
NEUTROS ABS: 3336 {cells}/uL (ref 1500–7800)
Neutrophils Relative %: 53.8 %
Platelets: 187 10*3/uL (ref 140–400)
RBC: 3.95 10*6/uL — ABNORMAL LOW (ref 4.20–5.80)
RDW: 12.8 % (ref 11.0–15.0)
Total Lymphocyte: 33.4 %
WBC mixed population: 577 cells/uL (ref 200–950)
WBC: 6.2 10*3/uL (ref 3.8–10.8)

## 2017-08-24 LAB — COMPLETE METABOLIC PANEL WITH GFR
AG RATIO: 1.4 (calc) (ref 1.0–2.5)
ALKALINE PHOSPHATASE (APISO): 60 U/L (ref 40–115)
ALT: 16 U/L (ref 9–46)
AST: 23 U/L (ref 10–35)
Albumin: 4.3 g/dL (ref 3.6–5.1)
BILIRUBIN TOTAL: 0.5 mg/dL (ref 0.2–1.2)
BUN/Creatinine Ratio: 10 (calc) (ref 6–22)
BUN: 19 mg/dL (ref 7–25)
CHLORIDE: 106 mmol/L (ref 98–110)
CO2: 29 mmol/L (ref 20–32)
Calcium: 9.7 mg/dL (ref 8.6–10.3)
Creat: 1.94 mg/dL — ABNORMAL HIGH (ref 0.70–1.33)
GFR, Est African American: 44 mL/min/{1.73_m2} — ABNORMAL LOW (ref >=60)
GFR, Est Non African American: 38 mL/min/{1.73_m2} — ABNORMAL LOW (ref >=60)
Globulin: 3.1 g/dL (calc) (ref 1.9–3.7)
Glucose, Bld: 107 mg/dL — ABNORMAL HIGH (ref 65–99)
POTASSIUM: 4.1 mmol/L (ref 3.5–5.3)
Sodium: 140 mmol/L (ref 135–146)
Total Protein: 7.4 g/dL (ref 6.1–8.1)

## 2017-08-25 NOTE — Progress Notes (Signed)
Creatinine and GFR stable.  Discussed with Dr. Estanislado Pandy.  He has a history of chronic kidney disease. He had a left partial nephrectomy on 04/20/17. He is followed by urology.  We will continue to monitor.  Recheck labs every 2 months. Please have him schedule a follow up appointment in 3 months. His anemia is improving.

## 2017-08-26 ENCOUNTER — Ambulatory Visit (INDEPENDENT_AMBULATORY_CARE_PROVIDER_SITE_OTHER): Payer: BC Managed Care – PPO | Admitting: Internal Medicine

## 2017-08-26 ENCOUNTER — Encounter: Payer: Self-pay | Admitting: Internal Medicine

## 2017-08-26 VITALS — BP 120/78 | HR 70 | Ht 69.0 in | Wt 240.2 lb

## 2017-08-26 DIAGNOSIS — D869 Sarcoidosis, unspecified: Secondary | ICD-10-CM | POA: Diagnosis not present

## 2017-08-26 NOTE — Patient Instructions (Signed)
ICD-10-CM   1. Sarcoidosis D86.9    Clinically In remission  Reduce methotreate to 3 pills (7.5mg  total) once a week Prednisone taper per Dr Estanislado Pandy Continue folic acid In 3 months do Pre-bd spiro and dlco only. No lung volume or bd response. No post-bd spiro   Followup - Return to see me in 3 months but after breathing test; will decide on imaging at the time of followup

## 2017-08-26 NOTE — Progress Notes (Signed)
Subjective:     Patient ID: Mike Cruz, male   DOB: February 02, 1966, 52 y.o.   MRN: [id number]  HPI  HPI  IOV 11/22/2016  Chief Complaint  Patient presents with  . Pulmonary Consult    Pt referred by Dr. Inda Merlin for sarcoid. Pt c/o SOB with activity x 9 months. Pt states he was recently hospitalized for BIL pna. Pt c/o non prod cough, chest congstion. Pt denies f/c/s and CP/tightness.     52 year old male. He is a [name]. 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 He tells me that starting July August 2017 started noticing left-sided cervical adenopathy associated with some night sweats and mild shortness of breath and fatigue. Workup in August 2017 : 03/11/2016 had CT chest and abdomen pelvis: I personally visualized this film and it is significant for diffuse adenopathy in the mediastinal and abdominal areas. 03/12/2016 the following day he underwent cervical lymph node biopsy by Dr. Arville Care and this showed granulomatous lymphadenitis.. Tthis led to the diagnosis of sarcoidosis. On the CT chest as pulmonary lung fields had just some scattered mild nodularity which is reflected on the chest x-ray from 10/19/2016 which I personally visualized that shows just mild interstitial prominence c/w aug 2017 films and improved from pneumonia admit march 2018. Then in September 2017 he was started on prednisone. By October November 2017 due to lack of resolution of the symptoms and the lymph node area he was referred to Dr.  Charlestine Night rheumatologist   (since retired) and was started on methotrexate. Patient states that he is on 10 mg of methotrexate on Thursday once weekly and 10 mg of methotrexate on Friday once weekly totaling over 20 mg of methotrexate weekly. He is on folic acid. With this he did improve upon his symptoms with the fatigue and shortness of breath and the lymphadenopathy. However he has not had follow-up CT imaging of his abdomen. Then in March 2018 was  diagnosed with rhinovirus respiratory infection pneumonia and was admitted. HIV test was negative at that time. He did have acute kidney injury which seemed to be improving as of 10/03/2016 but no follow-up labs since then on the computer. He also seems to have chronic anemia. CXR shows resultion April 2018. Echocardiogram 10/01/2016: shows left ventricle ejection fraction 55% with grade 1 diastolic dysfunction  Currently he has improved in terms of his chest x-ray. He still has some post pneumonia fatigue but otherwise is feeling stable.  He particularly denies any clinical diagnosis of sarcoidosis in his brain or eyes or skin or renal system although he is known to have high elevated creatinine for a few years which has been presumed due to hypertension. He has never seen a nephrologist.    OV 08/26/2017  Chief Complaint  Patient presents with  . Follow-up    sarcoidosis- feeling good slight congestion, getting over a cold    Follow-up sarcoidosis diagnosed in 2017 in the form of diffuse mediastinal and abdominal lymphadenopathy and cervical lymphadenopathy with some pulmonary infiltrates.  Started sometime towards the fall 2017 methotrexate and prednisone  I personally saw him in May 2018.  At the time he was on 20 mg of methotrexate a week.  I reduce it to 10 mg/week.  I had him continue his daily prednisone.  Since that time he has had a partial left nephrectomy because of my referral to nephrologist because of increased creatinine.  This was done towards the end of 2018.  He is  also seen Dr. Estanislado Pandy rheumatologist.  She has him now tapering on prednisone.  He is essentially asymptomatic from a respiratory standpoint.  2 days ago he picked up a cold but he is recovering well from that with Alka-Seltzer.  There are no other new issues no shortness of breath wheezing or cough or chills or B symptoms.  Latest creatinine shows chronic kidney insufficiency but this being followed by Dr. Madelon Lips     PULMONARY No results for input(s): PHART, PCO2ART, PO2ART, HCO3, TCO2, O2SAT in the last 168 hours.  Invalid input(s): PCO2, PO2  CBC Recent Labs  Lab 08/24/17 0906  HGB 11.6*  HCT 35.1*  WBC 6.2  PLT 187    COAGULATION No results for input(s): INR in the last 168 hours.  CARDIAC  No results for input(s): TROPONINI in the last 168 hours. No results for input(s): PROBNP in the last 168 hours.   CHEMISTRY Recent Labs  Lab 08/24/17 0906  NA 140  K 4.1  CL 106  CO2 29  GLUCOSE 107*  BUN 19  CREATININE 1.94*  CALCIUM 9.7   Estimated Creatinine Clearance: 53.6 mL/min (A) (by C-G formula based on SCr of 1.94 mg/dL (H)).   LIVER Recent Labs  Lab 08/24/17 0906  AST 23  ALT 16  BILITOT 0.5  PROT 7.4     INFECTIOUS No results for input(s): LATICACIDVEN, PROCALCITON in the last 168 hours.   ENDOCRINE CBG (last 3)  No results for input(s): GLUCAP in the last 72 hours.       IMAGING x48h  - image(s) personally visualized  -   highlighted in bold No results found.     has a past medical history of Acute renal failure (ARF) ([place]), Acute respiratory failure ([place]), Anemia, History of kidney stones, Hypertension, Hyponatremia, Kidney stone, Pneumonia, and Sarcoidosis.   reports that  has never smoked. he has never used smokeless tobacco.  Past Surgical History:  Procedure Laterality Date  . HEAD & NECK SKIN LESION EXCISIONAL BIOPSY    . KNEE SURGERY Left    Arthroscopic  . partial nephrectomt     Left 04/20/17 Dr. Tresa Moore  . ROBOTIC ASSITED PARTIAL NEPHRECTOMY Left 04/20/2017   Procedure: XI ROBOTIC ASSITED PARTIAL NEPHRECTOMY;  Surgeon: Alexis Frock, MD;  Location: [facility];  Service: Urology;  Laterality: Left;    Allergies  Allergen Reactions  . Penicillins Hives    Denies airway involvement    Immunization History  Administered Date(s) Administered  . Influenza,inj,Quad PF,6+ Mos 04/18/2016, 04/21/2017    Family History   Problem Relation Age of Onset  . Cancer Mother        ovarian  . AAA (abdominal aortic aneurysm) Father   . Cancer Father        prostate and kidney  . Stroke Sister      Current Outpatient Medications:  .  alendronate (FOSAMAX) 70 MG tablet, Take 70 mg by mouth once a week. On Sunday, Disp: , Rfl:  .  Calcium Carb-Cholecalciferol (CALCIUM-VITAMIN D) 500-200 MG-UNIT tablet, Take 1 tablet by mouth daily., Disp: , Rfl:  .  folic acid (FOLVITE) 1 MG tablet, Take 1 mg by mouth daily., Disp: , Rfl:  .  methotrexate (RHEUMATREX) 2.5 MG tablet, TAKE 5 PILLS ON THURSDAY AND FRIDAY EACH WEEK (Patient taking differently: Takes 4 tablets by mouth every week on Thursdays), Disp: 40 tablet, Rfl: 2 .  Olmesartan-Amlodipine-HCTZ 40-5-12.5 MG TABS, Take 1 tablet by mouth every morning., Disp: , Rfl:  3 .  predniSONE (DELTASONE) 1 MG tablet, Take 3 mg by mouth daily until January 14 then 2 mg po daily for 2 months, Disp: 153 tablet, Rfl: 0 .  HYDROcodone-acetaminophen (NORCO) 5-325 MG tablet, Take 1-2 tablets by mouth every 6 (six) hours as needed for moderate pain or severe pain. (Patient not taking: Reported on 08/26/2017), Disp: 30 tablet, Rfl: 0    Review of Systems     Objective:   Physical Exam  Constitutional: He is oriented to person, place, and time. He appears well-developed and well-nourished. No distress.  HENT:  Head: Normocephalic and atraumatic.  Right Ear: External ear normal.  Left Ear: External ear normal.  Mouth/Throat: Oropharynx is clear and moist. No oropharyngeal exudate.  Eyes: Conjunctivae and EOM are normal. Pupils are equal, round, and reactive to light. Right eye exhibits no discharge. Left eye exhibits no discharge. No scleral icterus.  Neck: Normal range of motion. Neck supple. No JVD present. No tracheal deviation present. No thyromegaly present.  Cardiovascular: Normal rate, regular rhythm and intact distal pulses. Exam reveals no gallop and no friction rub.  No  murmur heard. Pulmonary/Chest: Effort normal and breath sounds normal. No respiratory distress. He has no wheezes. He has no rales. He exhibits no tenderness.  Abdominal: Soft. Bowel sounds are normal. He exhibits no distension and no mass. There is no tenderness. There is no rebound and no guarding.  Musculoskeletal: Normal range of motion. He exhibits no edema or tenderness.  Lymphadenopathy:    He has no cervical adenopathy.  Neurological: He is alert and oriented to person, place, and time. He has normal reflexes. No cranial nerve deficit. Coordination normal.  Skin: Skin is warm and dry. No rash noted. He is not diaphoretic. No erythema. No pallor.  Psychiatric: He has a normal mood and affect. His behavior is normal. Judgment and thought content normal.  Nursing note and vitals reviewed.  Vitals:   08/26/17 1018 08/26/17 1019  BP:  120/78  Pulse:  70  SpO2:  97%  Weight: 240 lb 3.2 oz (109 kg)   Height: 5\' 9"  (1.753 m)        Assessment:       ICD-10-CM   1. Sarcoidosis D86.9        Plan:      Clinically In remission  Reduce methotreate to 3 pills (7.5mg  total) once a week Prednisone taper per Dr Estanislado Pandy Continue folic acid In 3 months do Pre-bd spiro and dlco only. No lung volume or bd response. No post-bd spiro   Followup - Return to see me in 3 months but after breathing test; will decide on imaging at the time of followup   Dr. Brand Males, M.D., [facility].C.P Pulmonary and Critical Care Medicine Staff Physician, [place] Director - Interstitial Lung Disease  Program  Pulmonary [place] [facility] [place], [place], [address]  Pager: [phone], If no answer or between  15:00h - 7:00h: call [phone] Telephone: [phone]

## 2017-08-26 NOTE — Progress Notes (Signed)
ft 

## 2017-09-19 ENCOUNTER — Other Ambulatory Visit: Payer: Self-pay | Admitting: Rheumatology

## 2017-09-19 NOTE — Telephone Encounter (Signed)
Last Visit: 04/07/17 Next Visit: 10/12/17  Okay to refill per Dr. Estanislado Pandy

## 2017-09-28 NOTE — Progress Notes (Signed)
Office Visit Note  Patient: Mike Cruz             Date of Birth: 03/29/1966           MRN: [id number]             PCP: Josetta Huddle, MD Referring: Josetta Huddle, MD Visit Date: 10/12/2017 Occupation: @GUAROCC @    Subjective:  Medication monitoring   History of Present Illness: Mike Cruz is a 52 y.o. male with history of sarcoidosis.  States that he continues to take methotrexate 3 tablets weekly and folic acid 1 mg daily.  He states that he discontinued his prednisone in the middle of March.  He has not noticed any effects since stopping.  He tapered off Prednisone 1 mg.  He reports he went and had his cortisol checked yesterday.  Patient denies any recent flares of sarcoidosis.  He denies any shortness of breath or coughing.  Denies any pulmonary symptoms.  He states he is following up with Dr. Chase Caller soon having a chest x-ray and PFT performed.  He states he follows up with Dr. Chase Caller every 6 months.  He denies any swollen lymph nodes.  He denies any joint pain or joint swelling at this time.  He denies any stiffness in his joints.  He states that he has noticed some "fatigue" in his lower extremities.  He denies any weakness or muscle tenderness.  He states he has not been exercising as frequently.      Activities of Daily Living:  Patient reports morning stiffness for 0  minutes.   Patient Denies nocturnal pain.  Difficulty dressing/grooming: Denies Difficulty climbing stairs: Denies Difficulty getting out of chair: Denies Difficulty using hands for taps, buttons, cutlery, and/or writing: Denies   Review of Systems  Constitutional: Negative for fatigue and night sweats.  HENT: Negative for mouth sores, trouble swallowing, trouble swallowing, mouth dryness and nose dryness.   Eyes: Negative for redness, visual disturbance and dryness.  Respiratory: Negative for cough, hemoptysis, shortness of breath and difficulty breathing.   Cardiovascular: Negative for chest  pain, palpitations, hypertension, irregular heartbeat and swelling in legs/feet.  Gastrointestinal: Negative for blood in stool, constipation and diarrhea.  Endocrine: Negative for increased urination.  Genitourinary: Negative for painful urination.  Musculoskeletal: Positive for myalgias and myalgias. Negative for arthralgias, joint pain, joint swelling, muscle weakness, morning stiffness and muscle tenderness.  Skin: Negative for color change, rash, hair loss, nodules/bumps, skin tightness, ulcers and sensitivity to sunlight.  Allergic/Immunologic: Negative for susceptible to infections.  Neurological: Negative for dizziness, fainting, memory loss, night sweats and weakness.  Hematological: Negative for swollen glands.  Psychiatric/Behavioral: Negative for depressed mood and sleep disturbance. The patient is not nervous/anxious.     PMFS History:  Patient Active Problem List   Diagnosis Date Noted  . Renal mass 04/20/2017  . History of hypertension 12/31/2016  . History of anemia 12/31/2016  . High risk medication use 12/31/2016  . History of renal calculi 12/31/2016  . History of prostatitis 12/31/2016  . On prednisone therapy 12/31/2016  . Abnormal serum angiotensin-converting enzyme level 12/31/2016  . Acute kidney injury superimposed on chronic kidney disease ([place]) 11/22/2016  . History of viral pneumonia 11/22/2016  . Anemia 09/27/2016  . Sarcoidosis   . Hypertension     Past Medical History:  Diagnosis Date  . Acute renal failure (ARF) ([place])   . Acute respiratory failure ([place])   . Anemia   . History of kidney stones   .  Hypertension   . Hyponatremia   . Kidney stone    kidney mass 04/13/17  . Pneumonia   . Sarcoidosis     Family History  Problem Relation Age of Onset  . Cancer Mother        ovarian  . AAA (abdominal aortic aneurysm) Father   . Cancer Father        prostate and kidney  . Stroke Sister   . Healthy Daughter   . Healthy Daughter   . Healthy Son     Past Surgical History:  Procedure Laterality Date  . HEAD & NECK SKIN LESION EXCISIONAL BIOPSY    . KNEE SURGERY Left    Arthroscopic  . partial nephrectomt     Left 04/20/17 Dr. Tresa Moore  . ROBOTIC ASSITED PARTIAL NEPHRECTOMY Left 04/20/2017   Procedure: XI ROBOTIC ASSITED PARTIAL NEPHRECTOMY;  Surgeon: Alexis Frock, MD;  Location: [facility];  Service: Urology;  Laterality: Left;   Social History   Social History Narrative  . Not on file     Objective: Vital Signs: BP 128/87 (BP Location: Left Arm, Patient Position: Sitting, Cuff Size: Large)   Pulse 75   Resp 16   Ht 5\' 9"  (1.753 m)   Wt 242 lb (109.8 kg)   BMI 35.74 kg/m    Physical Exam  Constitutional: He is oriented to person, place, and time. He appears well-developed and well-nourished.  HENT:  Head: Normocephalic and atraumatic.  Eyes: Pupils are equal, round, and reactive to light. Conjunctivae and EOM are normal.  Neck: Normal range of motion. Neck supple.  Cardiovascular: Normal rate, regular rhythm and normal heart sounds.  Pulmonary/Chest: Effort normal and breath sounds normal.  Abdominal: Soft. Bowel sounds are normal.  Neurological: He is alert and oriented to person, place, and time.  Skin: Skin is warm and dry. Capillary refill takes less than 2 seconds.  Psychiatric: He has a normal mood and affect. His behavior is normal.  Nursing note and vitals reviewed.    Musculoskeletal Exam: C-spine, thoracic spine, lumbar spine good range of motion.  No midline spinal tenderness.  No SI joint tenderness.  Shoulder joints, elbow joints, wrist joints, MCPs, PIPs, DIPs good range of motion with no synovitis.  Hip joints, knee joints, ankle joints, MTPs, PIPs, DIPs good range of motion with no synovitis.  No tenderness of trochanteric bursa.  No warmth or effusion of bilateral knees.  CDAI Exam: No CDAI exam completed.    Investigation: No additional findings. CBC Latest Ref Rng & Units 08/24/2017 05/24/2017  04/21/2017  WBC 3.8 - 10.8 Thousand/uL 6.2 6.4 -  Hemoglobin 13.2 - 17.1 g/dL 11.6(L) 9.9(L) 10.0(L)  Hematocrit 38.5 - 50.0 % 35.1(L) 31.7(L) 29.9(L)  Platelets 140 - 400 Thousand/uL 187 180 -   CMP Latest Ref Rng & Units 08/24/2017 05/24/2017 04/21/2017  Glucose 65 - 99 mg/dL 107(H) 95 115(H)  BUN 7 - 25 mg/dL 19 17 16   Creatinine 0.70 - 1.33 mg/dL 1.94(H) 1.88(H) 2.15(H)  Sodium 135 - 146 mmol/L 140 138 138  Potassium 3.5 - 5.3 mmol/L 4.1 3.7 3.6  Chloride 98 - 110 mmol/L 106 104 106  CO2 20 - 32 mmol/L 29 27 26   Calcium 8.6 - 10.3 mg/dL 9.7 9.6 8.5(L)  Total Protein 6.1 - 8.1 g/dL 7.4 7.0 -  Total Bilirubin 0.2 - 1.2 mg/dL 0.5 0.6 -  Alkaline Phos 40 - 115 U/L - - -  AST 10 - 35 U/L 23 24 -  ALT 9 -  46 U/L 16 18 -    Imaging: No results found.  Speciality Comments: No specialty comments available.    Procedures:  No procedures performed Allergies: Penicillins   Assessment / Plan:     Visit Diagnoses: Sarcoidosis - Positive Ace, hilar lymphadenopathy, positive lymph node biopsy. Diagnosed and treated by Dr. Charlestine Night with long-term prednisone and methotrexate.:  No pulmonary symptoms.  Lungs clear to auscultation.  Patient is taking methotrexate 3 tablets weekly and folic acid 1 mg daily.  He discontinued the use of prednisone he denies any symptoms since discontinuing his prednisone.  He had a cortisol level checked yesterday and results are pending.    He is following up with Dr. Chase Caller soon and having a chest x-ray and PFT.  We discussed with patient starting to taper his methotrexate further following recommendations of Dr. Chase Caller.  High risk medication use - Methotrexate 3 tablets by mouth every week, folic acid 1 mg by mouth daily.  CBC and CMP will be checked in May and every 3 months to monitor for drug toxicity.  Standing orders were placed today.- Plan: CBC with Differential/Platelet, COMPLETE METABOLIC PANEL WITH GFR  Muscle fatigue -He reports muscle fatigue in his  lower extremities.  He has no muscle weakness or muscle tenderness.  He has been exercising less frequently and his fatigue is likely due to deconditioning.  CK level will be checked with his next labs in May.  Future order was placed.  Plan: CK   Other medical conditions are listed as follows:  Chronic kidney disease, unspecified CKD stage  Renal mass  History of renal calculi  History of anemia  History of hypertension  History of prostatitis     Orders: Orders Placed This Encounter  Procedures  . CBC with Differential/Platelet  . COMPLETE METABOLIC PANEL WITH GFR  . CK   Meds ordered this encounter  Medications  . methotrexate (RHEUMATREX) 2.5 MG tablet    Sig: Take 3 tablets by mouth every week on Thursdays    Dispense:  40 tablet    Refill:  2  . folic acid (FOLVITE) 1 MG tablet    Sig: Take 1 tablet (1 mg total) by mouth daily.    Dispense:  90 tablet    Refill:  3     Follow-Up Instructions: Return in about 6 months (around 04/14/2018) for Sarcoidosis.   Ofilia Neas, PA-C   I examined and evaluated the patient with Hazel Sams PA.  Patient had no synovitis on examination.  He is tapered off prednisone.  His a.m. cortisol level is pending at this time.  The continuation of methotrexate will be based on Dr. Golden Pop recommendations depending on if he has active pulmonary disease.  The plan of care was discussed as noted above.  Bo Merino, MD  Note - This record has been created using Editor, commissioning.  Chart creation errors have been sought, but may not always  have been located. Such creation errors do not reflect on  the standard of medical care.

## 2017-09-29 ENCOUNTER — Telehealth: Payer: Self-pay | Admitting: Rheumatology

## 2017-09-29 NOTE — Telephone Encounter (Signed)
Patient called stating that he has been tapering off the Prednisone and has been down to 1 mg for the past month.  Patient is confirming that he can stop taking it.  Patient also has questions regarding the Alendronate and how much longer to take it.

## 2017-09-30 NOTE — Telephone Encounter (Signed)
Attempted to contact the patient and left message for patient to call the office.  

## 2017-10-04 ENCOUNTER — Telehealth: Payer: Self-pay | Admitting: Genetics

## 2017-10-04 ENCOUNTER — Encounter: Payer: Self-pay | Admitting: Genetics

## 2017-10-04 NOTE — Telephone Encounter (Signed)
A genetic counseling appt has been scheduled for the pt to see Ferol Luz on 5/7 at 10am. Pt aware to arrive 30 minutes early. Letter mailed to the pt.

## 2017-10-05 ENCOUNTER — Telehealth: Payer: Self-pay | Admitting: Rheumatology

## 2017-10-05 DIAGNOSIS — Z7952 Long term (current) use of systemic steroids: Secondary | ICD-10-CM

## 2017-10-05 NOTE — Telephone Encounter (Signed)
Patient returning your call.

## 2017-10-05 NOTE — Telephone Encounter (Signed)
Patient advised that according to the last office note that he may discontinue his fosamax. Patient states he is off of his Prednisone. According to his last office not he was due to taper until June and then discontinue. Per Dr. Estanislado Pandy patient is okay to stay off Prednisone and will need to come to the office for an AM cortisol to check for adrenal insufficiency. Patient verbalized understanding.

## 2017-10-11 ENCOUNTER — Other Ambulatory Visit: Payer: Self-pay

## 2017-10-11 DIAGNOSIS — Z7952 Long term (current) use of systemic steroids: Secondary | ICD-10-CM

## 2017-10-12 ENCOUNTER — Encounter: Payer: Self-pay | Admitting: Rheumatology

## 2017-10-12 ENCOUNTER — Ambulatory Visit: Payer: BC Managed Care – PPO | Admitting: Rheumatology

## 2017-10-12 VITALS — BP 128/87 | HR 75 | Resp 16 | Ht 69.0 in | Wt 242.0 lb

## 2017-10-12 DIAGNOSIS — N189 Chronic kidney disease, unspecified: Secondary | ICD-10-CM

## 2017-10-12 DIAGNOSIS — Z87442 Personal history of urinary calculi: Secondary | ICD-10-CM | POA: Diagnosis not present

## 2017-10-12 DIAGNOSIS — N2889 Other specified disorders of kidney and ureter: Secondary | ICD-10-CM | POA: Diagnosis not present

## 2017-10-12 DIAGNOSIS — M6289 Other specified disorders of muscle: Secondary | ICD-10-CM

## 2017-10-12 DIAGNOSIS — D869 Sarcoidosis, unspecified: Secondary | ICD-10-CM | POA: Diagnosis not present

## 2017-10-12 DIAGNOSIS — Z87438 Personal history of other diseases of male genital organs: Secondary | ICD-10-CM

## 2017-10-12 DIAGNOSIS — Z79899 Other long term (current) drug therapy: Secondary | ICD-10-CM | POA: Diagnosis not present

## 2017-10-12 DIAGNOSIS — Z862 Personal history of diseases of the blood and blood-forming organs and certain disorders involving the immune mechanism: Secondary | ICD-10-CM

## 2017-10-12 DIAGNOSIS — Z8679 Personal history of other diseases of the circulatory system: Secondary | ICD-10-CM

## 2017-10-12 LAB — CORTISOL-AM, BLOOD: Cortisol - AM: 8.6 ug/dL

## 2017-10-12 MED ORDER — METHOTREXATE 2.5 MG PO TABS: ORAL_TABLET | ORAL | 2 refills | Status: DC

## 2017-10-12 MED ORDER — FOLIC ACID 1 MG PO TABS: 1.0000 mg | ORAL_TABLET | Freq: Every day | ORAL | 3 refills | Status: DC

## 2017-10-12 NOTE — Patient Instructions (Signed)
Standing Labs We placed an order today for your standing lab work.    Please come back and get your standing labs in May and every 3 months   We have open lab Monday through Friday from 8:30-11:30 AM and 1:30-4:00 PM  at the office of Dr. Bo Merino.   You may experience shorter wait times on Monday and Friday afternoons. The office is located at [address], [place], [place], [place] [address] No appointment is necessary.   Labs are drawn by [facility].  You may receive a bill from [facility] for your lab work. If you have any questions regarding directions or hours of operation,  please call [phone].

## 2017-11-03 ENCOUNTER — Encounter: Payer: Self-pay | Admitting: Rheumatology

## 2017-11-03 DIAGNOSIS — C649 Malignant neoplasm of unspecified kidney, except renal pelvis: Secondary | ICD-10-CM | POA: Insufficient documentation

## 2017-11-22 ENCOUNTER — Encounter: Payer: Self-pay | Admitting: Genetics

## 2017-11-22 ENCOUNTER — Inpatient Hospital Stay: Payer: BC Managed Care – PPO

## 2017-11-22 ENCOUNTER — Inpatient Hospital Stay: Payer: BC Managed Care – PPO | Attending: Genetic Counselor | Admitting: Genetics

## 2017-11-22 DIAGNOSIS — C649 Malignant neoplasm of unspecified kidney, except renal pelvis: Secondary | ICD-10-CM

## 2017-11-22 DIAGNOSIS — Z7183 Encounter for nonprocreative genetic counseling: Secondary | ICD-10-CM

## 2017-11-22 DIAGNOSIS — Z800 Family history of malignant neoplasm of digestive organs: Secondary | ICD-10-CM

## 2017-11-22 DIAGNOSIS — Z8041 Family history of malignant neoplasm of ovary: Secondary | ICD-10-CM

## 2017-11-22 DIAGNOSIS — Z8051 Family history of malignant neoplasm of kidney: Secondary | ICD-10-CM

## 2017-11-22 DIAGNOSIS — Z8042 Family history of malignant neoplasm of prostate: Secondary | ICD-10-CM

## 2017-11-22 NOTE — Progress Notes (Signed)
REFERRING PROVIDER: Josetta Huddle, MD [address] [place], [place] [address]  PRIMARY PROVIDER:  Josetta Huddle, MD  PRIMARY REASON FOR VISIT:  1. Renal cell carcinoma, unspecified laterality ([place])   2. Family history of kidney cancer   3. Family history of ovarian cancer   4. Family history of prostate cancer   5. Family history of stomach cancer   6. Family history of colon cancer     HISTORY OF PRESENT ILLNESS:   Mike Cruz, a 52 y.o. male, was seen for a [facility] cancer genetics consultation at the request of Dr. Inda Merlin due to a personal and family history of cancer.  Mike Cruz presents to clinic today to discuss the possibility of a hereditary predisposition to cancer, genetic testing, and to further clarify his future cancer risks, as well as potential cancer risks for family members.   On 04/20/2017, at the age of 23, Mike Cruz had a partial resection, left renal mass.  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  The tumor was confined within the renal capsule.   Colonoscopy: yes; reportedly normal.  Past Medical History:  Diagnosis Date  . Acute renal failure (ARF) ([place])   . Acute respiratory failure ([place])   . Anemia   . Family history of colon cancer   . Family history of kidney cancer   . Family history of ovarian cancer   . Family history of prostate cancer   . Family history of stomach cancer   . History of kidney stones   . Hypertension   . Hyponatremia   . Kidney stone    kidney mass 04/13/17  . Pneumonia   . Sarcoidosis     Past Surgical History:  Procedure Laterality Date  . HEAD & NECK SKIN LESION EXCISIONAL BIOPSY    . KNEE SURGERY Left    Arthroscopic  . partial nephrectomt     Left 04/20/17 Dr. Tresa Moore  . ROBOTIC ASSITED PARTIAL NEPHRECTOMY Left 04/20/2017   Procedure: XI ROBOTIC ASSITED PARTIAL NEPHRECTOMY;  Surgeon: Alexis Frock, MD;  Location: [facility];  Service: Urology;  Laterality: Left;    Social History    Socioeconomic History  . Marital status: Divorced    Spouse name: Not on file  . Number of children: Not on file  . Years of education: Not on file  . Highest education level: Not on file  Occupational History  . Occupation: Programmer, multimedia: [employer]  Social Needs  . Financial resource strain: Not on file  . Food insecurity:    Worry: Not on file    Inability: Not on file  . Transportation needs:    Medical: Not on file    Non-medical: Not on file  Tobacco Use  . Smoking status: Never Smoker  . Smokeless tobacco: Never Used  Substance and Sexual Activity  . Alcohol use: No  . Drug use: Never  . Sexual activity: Yes  Lifestyle  . Physical activity:    Days per week: Not on file    Minutes per session: Not on file  . Stress: Not on file  Relationships  . Social connections:    Talks on phone: Not on file    Gets together: Not on file    Attends religious service: Not on file    Active member of club or organization: Not on file    Attends meetings of clubs or organizations: Not on file    Relationship status: Not on file  Other Topics Concern  . Not on file  Social History Narrative  . Not on file     FAMILY HISTORY:  We obtained a detailed, 4-generation family history.  Significant diagnoses are listed below: Family History  Problem Relation Age of Onset  . Ovarian cancer Mother 89  . AAA (abdominal aortic aneurysm) Father   . Prostate cancer Father 68  . Kidney cancer Father 26       was 2 separate primaries, not a met  . Stroke Sister 26  . Healthy Daughter   . Healthy Daughter   . Healthy Son   . Lung cancer Maternal Aunt   . Stomach cancer Maternal Grandmother 77  . Colon cancer Maternal Grandmother 52  . Prostate cancer Maternal Grandfather 65       metastatic  . Lymphoma Cousin   . Prostate cancer Cousin 31   Mike Cruz has 2 daughter and 1 son.  His oldest daughter is not biological.  His other daughter is 31 with no history of cancer, his son  is 48 with no history of cancer.  Mike Cruz has 5 grandchildren.  Mike Cruz has a sister who died at 25 due to a stroke.  She had 1 daughter with no history of cancer.   Mr. Edgell father: died at 83, he had a history of both prostate cancer and kidney cancer both diagnosed in his mid 25's (2 separate primaries) Paternal Aunts/Uncles: unknown Paternal cousins: unknown Paternal grandfather: unknown Paternal grandmother:unknown  Mike Cruz's mother: died at 7 due to ovarian cancer dx at 69 Maternal Aunts/Uncles: 3 maternal aunts.  1 developed lung cancer.  Maternal cousins: 1 is deceased and had a history of lymphoma, 1 is alive, but has has a history of prostate cancer dx at 83 Maternal grandfather: died of prostate cancer at 65.  Maternal grandmother:stomach cancer dx in 27's and colon cancer dx in 48's.   Mike Cruz is unaware of previous family history of genetic testing for hereditary cancer risks. Patient's maternal ancestors are of African American/Caucasian descent, and paternal ancestors are of African American/Caucasian descent. There is no reported Ashkenazi Jewish ancestry. There is no known consanguinity.  GENETIC COUNSELING ASSESSMENT: Mike Cruz is a 52 y.o. male with a personal and family history which is somewhat suggestive of a Hereditary Cancer Predisposition Syndrome. We, therefore, discussed and recommended the following at today's visit.   DISCUSSION: We reviewed the characteristics, features and inheritance patterns of hereditary cancer syndromes. We also discussed genetic testing, including the appropriate family members to test, the process of testing, insurance coverage and turn-around-time for results. We discussed the implications of a negative, positive and/or variant of uncertain significant result. We recommended Mike Cruz pursue genetic testing for the Multi-Cancer gene panel. The Multi-Cancer Panel offered by [facility] includes sequencing and/or deletion  duplication testing of the following 83 genes: ALK, APC, ATM, AXIN2,BAP1,  BARD1, BLM, BMPR1A, BRCA1, BRCA2, BRIP1, CASR, CDC73, CDH1, CDK4, CDKN1B, CDKN1C, CDKN2A (p14ARF), CDKN2A (p16INK4a), CEBPA, CHEK2, CTNNA1, DICER1, DIS3L2, EGFR (c.2369C>T, p.Thr790Met variant only), EPCAM (Deletion/duplication testing only), FH, FLCN, GATA2, GPC3, GREM1 (Promoter region deletion/duplication testing only), HOXB13 (c.251G>A, p.Gly84Glu), HRAS, KIT, MAX, MEN1, MET, MITF (c.952G>A, p.Glu318Lys variant only), MLH1, MSH2, MSH3, MSH6, MUTYH, NBN, NF1, NF2, NTHL1, PALB2, PDGFRA, PHOX2B, PMS2, POLD1, POLE, POT1, PRKAR1A, PTCH1, PTEN, RAD50, RAD51C, RAD51D, RB1, RECQL4, RET, RUNX1, SDHAF2, SDHA (sequence changes only), SDHB, SDHC, SDHD, SMAD4, SMARCA4, SMARCB1, SMARCE1, STK11, SUFU, TERC, TERT, TMEM127, TP53, TSC1, TSC2, VHL, WRN and WT1.  We discussed there are several different genes that are associated with an increased risk for renal cell carcinoma.  Mike Cruz pathology of papillary type 1 is most suggestive of a mutation in the proto-onco gene MET.  This gene causes Hereditary Papillary renal cell carcinoma.  This is an autosomal dominant syndrome that increases risk for type I papillary renal cell carcinoma.  Most individuals with a MET pathogenic variant will develop renal cell carcinoma.  Screening and imaging can be offered to individuals with this condition with the goal of detecting any cancer at an early stage.  There are several other renal carcinoma risk genes as well as many other cancer predisposition syndromes cause by mutations in several other genes.  We briefly discussed    Hereditary Breast and Ovarian Cancer (HBOC) syndrome.  This syndrome is caused by mutations in the BRCA1 and BRCA2 genes.  This syndrome increases an individual's lifetime risk to develop breast, ovarian, pancreatic, prostate, and other types of cancer.  There are also many other cancer predisposition syndromes caused by mutations in  several other genes.  We discussed that if he is found to have a mutation in one of these genes, it may impact future medical management recommendations such as increased cancer screenings and consideration of risk reducing surgeries.  A positive result could also have implications for the patient's family members.  A Negative result would mean we were unable to identify a hereditary component to his cancer, but does not rule out the possibility of a hereditary basis for his cancer.  There could be mutations that are undetectable by current technology, or in genes not yet tested or identified to increase cancer risk.    We discussed the potential to find a Variant of Uncertain Significance or VUS.  These are variants that have not yet been identified as pathogenic or benign, and it is unknown if this variant is associated with increased cancer risk or if this is a normal finding.  Most VUS's are reclassified to benign or likely benign.   It should not be used to make medical management decisions. With time, we suspect the lab will determine the significance of any VUS's identified if any.   Based on Mike Cruz's personal and family history of cancer, he meets medical criteria for genetic testing. Despite that he meets criteria, he may still have an out of pocket cost. We discussed that if his out of pocket cost for testing is over $100, the laboratory will call and confirm whether he wants to proceed with testing.  The laboratory will notify Mike Cruz of an insurance estimate, and he will also be given the option to choose the self-pay option of $250. Marland Kitchen   PLAN: After considering the risks, benefits, and limitations, Mike Cruz  provided informed consent to pursue genetic testing and the blood sample was sent to [facility] for analysis of the Multi-Cancers Panel. Results should be available within approximately 2-3 weeks' time, at which point they will be disclosed by telephone to Mike Cruz, as  will any additional recommendations warranted by these results. Mike Cruz will receive a summary of his genetic counseling visit and a copy of his results once available. This information will also be available in Epic. We encouraged Mr. Giebel to remain in contact with cancer genetics annually so that we can continuously update the family history and inform him of any changes in cancer genetics and testing that may be of benefit for his family. Mr. Wadlow questions were  answered to his satisfaction today. Our contact information was provided should additional questions or concerns arise.  Based on Mr. Gomer's family history, we recommended his maternal relatives also have genetic counseling and testing. Mr. Haymore will let us know if we can be of any assistance in coordinating genetic counseling and/or testing for this family member.   Lastly, we encouraged Mr. Pressnell to remain in contact with cancer genetics annually so that we can continuously update the family history and inform him of any changes in cancer genetics and testing that may be of benefit for this family.   Mr.  Weick questions were answered to his satisfaction today. Our contact information was provided should additional questions or concerns arise. Thank you for the referral and allowing [place] to share in the care of your patient.   Tana Felts, MS, [facility] Certified Genetic Counselor [email or web address] phone: [phone]  The patient was seen for a total of 35 minutes in face-to-face genetic counseling.

## 2017-11-24 ENCOUNTER — Other Ambulatory Visit: Payer: Self-pay

## 2017-11-24 DIAGNOSIS — Z79899 Other long term (current) drug therapy: Secondary | ICD-10-CM

## 2017-11-24 LAB — COMPLETE METABOLIC PANEL WITH GFR
AG Ratio: 1.5 (calc) (ref 1.0–2.5)
ALT: 23 U/L (ref 9–46)
AST: 26 U/L (ref 10–35)
Albumin: 4.2 g/dL (ref 3.6–5.1)
Alkaline phosphatase (APISO): 63 U/L (ref 40–115)
BILIRUBIN TOTAL: 0.4 mg/dL (ref 0.2–1.2)
BUN/Creatinine Ratio: 11 (calc) (ref 6–22)
BUN: 22 mg/dL (ref 7–25)
CHLORIDE: 107 mmol/L (ref 98–110)
CO2: 29 mmol/L (ref 20–32)
Calcium: 10 mg/dL (ref 8.6–10.3)
Creat: 1.99 mg/dL — ABNORMAL HIGH (ref 0.70–1.33)
GFR, EST AFRICAN AMERICAN: 43 mL/min/{1.73_m2} — AB (ref >=60)
GFR, Est Non African American: 37 mL/min/{1.73_m2} — ABNORMAL LOW (ref >=60)
GLOBULIN: 2.8 g/dL (ref 1.9–3.7)
Glucose, Bld: 138 mg/dL — ABNORMAL HIGH (ref 65–99)
POTASSIUM: 4.1 mmol/L (ref 3.5–5.3)
SODIUM: 142 mmol/L (ref 135–146)
Total Protein: 7 g/dL (ref 6.1–8.1)

## 2017-11-24 LAB — CBC WITH DIFFERENTIAL/PLATELET
BASOS ABS: 39 {cells}/uL (ref 0–200)
Basophils Relative: 0.6 %
EOS ABS: 130 {cells}/uL (ref 15–500)
EOS PCT: 2 %
HCT: 33.9 % — ABNORMAL LOW (ref 38.5–50.0)
HEMOGLOBIN: 11.4 g/dL — AB (ref 13.2–17.1)
Lymphs Abs: 1762 cells/uL (ref 850–3900)
MCH: 29.7 pg (ref 27.0–33.0)
MCHC: 33.6 g/dL (ref 32.0–36.0)
MCV: 88.3 fL (ref 80.0–100.0)
MONOS PCT: 9.2 %
MPV: 12 fL (ref 7.5–12.5)
NEUTROS ABS: 3972 {cells}/uL (ref 1500–7800)
Neutrophils Relative %: 61.1 %
PLATELETS: 171 10*3/uL (ref 140–400)
RBC: 3.84 10*6/uL — ABNORMAL LOW (ref 4.20–5.80)
RDW: 12.6 % (ref 11.0–15.0)
TOTAL LYMPHOCYTE: 27.1 %
WBC mixed population: 598 cells/uL (ref 200–950)
WBC: 6.5 10*3/uL (ref 3.8–10.8)

## 2017-11-25 NOTE — Progress Notes (Signed)
Glucose is elevated.  Patient has chronic kidney disease.  His Creatinine and GFR are stable.  Anemia is stable.  Please advise him to take multivitamin with iron if he isn't already. We will continue to monitor.

## 2017-12-06 ENCOUNTER — Telehealth: Payer: Self-pay | Admitting: Genetics

## 2017-12-06 NOTE — Telephone Encounter (Signed)
Revealed negative genetic testing.  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   This normal result means we did not identify a genetic cause for Mike Cruz's personal or family history of cancer.   While reassuring,  genetic testing is not perfect and he does still have a significant family history of cancer.  Genetic testing cannot definitively rule out a hereditary cause.  It will be important for him to keep in contact with genetics to learn if any additional testing may be needed in the future.     Recommended all relatives inform their doctors about the family history of cancer so they can appropriately screen and manage them.  We also recommended Mike Cruz's other relatives, siblings, aunts/uncles, niece, cousin, etc. Also have genetic testing because there could still be a genetic cause for the cancer in his family that he did not inherit and therefore was not found in him.

## 2017-12-07 ENCOUNTER — Encounter: Payer: Self-pay | Admitting: Genetics

## 2017-12-07 ENCOUNTER — Ambulatory Visit: Payer: Self-pay | Admitting: Genetics

## 2017-12-07 DIAGNOSIS — Z1379 Encounter for other screening for genetic and chromosomal anomalies: Secondary | ICD-10-CM

## 2017-12-07 DIAGNOSIS — Z8051 Family history of malignant neoplasm of kidney: Secondary | ICD-10-CM

## 2017-12-07 DIAGNOSIS — Z8042 Family history of malignant neoplasm of prostate: Secondary | ICD-10-CM

## 2017-12-07 DIAGNOSIS — Z8041 Family history of malignant neoplasm of ovary: Secondary | ICD-10-CM

## 2017-12-07 DIAGNOSIS — Z800 Family history of malignant neoplasm of digestive organs: Secondary | ICD-10-CM

## 2017-12-07 DIAGNOSIS — C649 Malignant neoplasm of unspecified kidney, except renal pelvis: Secondary | ICD-10-CM

## 2017-12-07 NOTE — Progress Notes (Signed)
HPI:  Mr. Rann was previously seen in the [place] clinic on 11/22/2017 due to a personal and family history of cancer and concerns regarding a hereditary predisposition to cancer. Please refer to our prior cancer genetics clinic note for more information regarding Mr. Slaymaker's medical, social and family histories, and our assessment and recommendations, at the time. Mr. Kerlin recent genetic test results were disclosed to him, as well as recommendations warranted by these results. These results and recommendations are discussed in more detail below.  CANCER HISTORY:  On 04/20/2017, at the age of 36, Mr. Wilkinson had a partial resection, left renal mass.  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  The tumor was confined within the renal capsule.    FAMILY HISTORY:  We obtained a detailed, 4-generation family history.  Significant diagnoses are listed below: Family History  Problem Relation Age of Onset  . Ovarian cancer Mother 56  . AAA (abdominal aortic aneurysm) Father   . Prostate cancer Father 62  . Kidney cancer Father 30       was 2 separate primaries, not a met  . Stroke Sister 34  . Healthy Daughter   . Healthy Daughter   . Healthy Son   . Lung cancer Maternal Aunt   . Stomach cancer Maternal Grandmother 15  . Colon cancer Maternal Grandmother 1  . Prostate cancer Maternal Grandfather 65       metastatic  . Lymphoma Cousin   . Prostate cancer Cousin 96    Mr. Baray has 2 daughter and 1 son.  His oldest daughter is not biological.  His other daughter is 19 with no history of cancer, his son is 17 with no history of cancer.  Mr. Hoadley has 5 grandchildren.  Mr. Lacher has a sister who died at 21 due to a stroke.  She had 1 daughter with no history of cancer.   Mr. Majka father: died at 34, he had a history of both prostate cancer and kidney cancer both diagnosed in his mid 25's (2 separate primaries) Paternal Aunts/Uncles: unknown Paternal  cousins: unknown Paternal grandfather: unknown Paternal grandmother:unknown  Mr. Liguori's mother: died at 36 due to ovarian cancer dx at 73 Maternal Aunts/Uncles: 3 maternal aunts.  1 developed lung cancer.  Maternal cousins: 1 is deceased and had a history of lymphoma, 1 is alive, but has has a history of prostate cancer dx at 4 Maternal grandfather: died of prostate cancer at 76.  Maternal grandmother:stomach cancer dx in 35's and colon cancer dx in 45's.   Mr. Sayas is unaware of previous family history of genetic testing for hereditary cancer risks. Patient's maternal ancestors are of African American/Caucasian descent, and paternal ancestors are of African American/Caucasian descent. There is no reported Ashkenazi Jewish ancestry. There is no known consanguinity.  GENETIC TEST RESULTS: Genetic testing performed through [facility]'s Multi-Cancer Panel reported out on 12/02/2017 showed no pathogenic mutations. The Multi-Cancer Panel offered by [facility] includes sequencing and/or deletion duplication testing of the following 83 genes: ALK, APC, ATM, AXIN2,BAP1,  BARD1, BLM, BMPR1A, BRCA1, BRCA2, BRIP1, CASR, CDC73, CDH1, CDK4, CDKN1B, CDKN1C, CDKN2A (p14ARF), CDKN2A (p16INK4a), CEBPA, CHEK2, CTNNA1, DICER1, DIS3L2, EGFR (c.2369C>T, p.Thr790Met variant only), EPCAM (Deletion/duplication testing only), FH, FLCN, GATA2, GPC3, GREM1 (Promoter region deletion/duplication testing only), HOXB13 (c.251G>A, p.Gly84Glu), HRAS, KIT, MAX, MEN1, MET, MITF (c.952G>A, p.Glu318Lys variant only), MLH1, MSH2, MSH3, MSH6, MUTYH, NBN, NF1, NF2, NTHL1, PALB2, PDGFRA, PHOX2B, PMS2, POLD1, POLE, POT1, PRKAR1A, PTCH1, PTEN, RAD50, RAD51C, RAD51D, RB1,  RECQL4, RET, RUNX1, SDHAF2, SDHA (sequence changes only), SDHB, SDHC, SDHD, SMAD4, SMARCA4, SMARCB1, SMARCE1, STK11, SUFU, TERC, TERT, TMEM127, TP53, TSC1, TSC2, VHL, WRN and WT1.   A variant of uncertain significance (VUS) in a gene called BMPR1A was also noted. c.583C>A  (p.Gln195Lys)  The test report will be scanned into EPIC and will be located under the Molecular Pathology section of the Results Review tab. A portion of the result report is included below for reference.    We discussed with Mr. Sherk that because current genetic testing is not perfect, it is possible there may be a gene mutation in one of these genes that current testing cannot detect, but that chance is small.  We also discussed, that there could be another gene that has not yet been discovered, or that we have not yet tested, that is responsible for the cancer diagnoses in the family. It is also possible there is a hereditary cause for the cancer in the family that Mr. Bourdon did not inherit and therefore was not identified in his testing.  Therefore, it is important to remain in touch with cancer genetics in the future so that we can continue to offer Mr. Glade the most up to date genetic testing.   Regarding the VUS in BMPR1A: At this time, it is unknown if this variant is associated with increased cancer risk or if this is a normal finding, but most variants such as this get reclassified to being inconsequential. It should not be used to make medical management decisions. With time, we suspect the lab will determine the significance of this variant, if any. If we do learn more about it, we will try to contact Mr. Dunnam to discuss it further. However, it is important to stay in touch with [place] periodically and keep the address and phone number up to date.  ADDITIONAL GENETIC TESTING: We discussed with Mr. Menta that his genetic testing was fairly extensive.  If there are are genes identified to increase cancer risk that can be analyzed in the future, we would be happy to discuss and coordinate this testing at that time.    CANCER SCREENING RECOMMENDATIONS: Mr. Picou test result is considered negative (normal).  This means that we have not identified a hereditary cause for his personal and  family history of cancer at this time.   While reassuring, this does not definitively rule out a hereditary predisposition to cancer. It is still possible that there could be genetic mutations that are undetectable by current technology, or genetic mutations in genes that have not been tested or identified to increase cancer risk.   Given Mr. Mosey's personal and family histories, we must consider these negative results carefully.  Families with features suggestive of hereditary risk for cancer tend to have multiple family members with cancer, diagnoses in multiple generations, and diagnoses before the age of 76. Mr. Russomanno family exhibits some of these features. Therefore this negative result may actually be due to the limitations of current technology and knowledge.   It is recommended he continue to follow the cancer management and screening guidelines provided by his oncology and primary healthcare provider. An individual's cancer risk is not determined by genetic test results alone.  Overall cancer risk assessment includes additional factors such as personal medical history, family history, etc.  These should be used to make a personalized plan for cancer prevention and surveillance.    RECOMMENDATIONS FOR FAMILY MEMBERS:  Relatives in this family might be at some increased  risk of developing cancer, over the general population risk, simply due to the family history of cancer.  We recommended women in this family have a yearly mammogram beginning at age 32, or 41 years younger than the earliest onset of cancer, an annual clinical breast exam, and perform monthly breast self-exams. Women in this family should also have a gynecological exam as recommended by their primary provider. All family members should have a colonoscopy by age 4 (or as directed by their doctors).  All family members should inform their physicians about the family history of cancer so their doctors can make the most appropriate  screening recommendations for them.   It is also possible there is a hereditary cause for the cancer in Mr. Dinneen's family that he did not inherit and therefore was not identified in him.  Therefore, we recommended maternal and paternal relatives also have genetic counseling and testing. Mr. Stead will let us know if we can be of any assistance in coordinating genetic counseling and/or testing for these family members.   FOLLOW-UP: Lastly, we discussed with Mr. Kreis that cancer genetics is a rapidly advancing field and it is possible that new genetic tests will be appropriate for him and/or his family members in the future. We encouraged him to remain in contact with cancer genetics on an annual basis so we can update his personal and family histories and let him know of advances in cancer genetics that may benefit this family.   Our contact number was provided. Mr. Mcnelly questions were answered to his satisfaction, and he knows he is welcome to call us at anytime with additional questions or concerns.   Ferol Luz, MS, [facility] Certified Genetic Counselor [email or web address]

## 2017-12-22 ENCOUNTER — Ambulatory Visit: Payer: BC Managed Care – PPO | Admitting: Internal Medicine

## 2017-12-22 ENCOUNTER — Encounter: Payer: Self-pay | Admitting: Internal Medicine

## 2017-12-22 ENCOUNTER — Ambulatory Visit (INDEPENDENT_AMBULATORY_CARE_PROVIDER_SITE_OTHER): Payer: BC Managed Care – PPO | Admitting: Internal Medicine

## 2017-12-22 VITALS — BP 112/78 | HR 75 | Ht 69.0 in | Wt 241.0 lb

## 2017-12-22 DIAGNOSIS — D869 Sarcoidosis, unspecified: Secondary | ICD-10-CM

## 2017-12-22 LAB — PULMONARY FUNCTION TEST
DL/VA % PRED: 104 %
DL/VA: 4.72 ml/min/mmHg/L
DLCO UNC % PRED: 82 %
DLCO UNC: 24.86 ml/min/mmHg
DLCO cor % pred: 92 %
DLCO cor: 27.74 ml/min/mmHg
FEF 25-75 Pre: 4.75 L/sec
FEF2575-%Pred-Pre: 158 %
FEV1-%Pred-Pre: 117 %
FEV1-Pre: 3.6 L
FEV1FVC-%PRED-PRE: 109 %
FEV6-%Pred-Pre: 111 %
FEV6-Pre: 4.17 L
FEV6FVC-%Pred-Pre: 102 %
FVC-%Pred-Pre: 108 %
FVC-PRE: 4.17 L
PRE FEV1/FVC RATIO: 86 %
Pre FEV6/FVC Ratio: 100 %

## 2017-12-22 NOTE — Progress Notes (Signed)
Subjective:     Patient ID: Mike Cruz, male   DOB: [birth year], 52 y.o.   MRN: [id number]  HPI  HPI  IOV 11/22/2016  Chief Complaint  Patient presents with  . Pulmonary Consult    Pt referred by Dr. Inda Cruz for sarcoid. Pt c/o SOB with activity x 9 months. Pt states he was recently hospitalized for BIL pna. Pt c/o non prod cough, chest congstion. Pt denies f/c/s and CP/tightness.     52 year old male. He is a [name]. 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 He tells me that starting July August 2017 started noticing left-sided cervical adenopathy associated with some Cruz sweats and mild shortness of breath and fatigue. Workup in August 2017 : 03/11/2016 had CT chest and abdomen pelvis: I personally visualized this film and it is significant for diffuse adenopathy in the mediastinal and abdominal areas. 03/12/2016 the following day he underwent cervical lymph node biopsy by Dr. Arville Cruz and this showed granulomatous lymphadenitis.. Tthis led to the diagnosis of sarcoidosis. On the CT chest as pulmonary lung fields had just some scattered mild nodularity which is reflected on the chest x-ray from 10/19/2016 which I personally visualized that shows just mild interstitial prominence c/w aug 2017 films and improved from pneumonia admit march 2018. Then in September 2017 he was started on prednisone. By October November 2017 due to lack of resolution of the symptoms and the lymph node area he was referred to Dr.  Charlestine Cruz rheumatologist   (since retired) and was started on methotrexate. Patient states that he is on 10 mg of methotrexate on Thursday once weekly and 10 mg of methotrexate on Friday once weekly totaling over 20 mg of methotrexate weekly. He is on folic acid. With this he did improve upon his symptoms with the fatigue and shortness of breath and the lymphadenopathy. However he has not had follow-up CT imaging of his abdomen. Then in March 2018  was diagnosed with rhinovirus respiratory infection pneumonia and was admitted. HIV test was negative at that time. He did have acute kidney injury which seemed to be improving as of 10/03/2016 but no follow-up labs since then on the computer. He also seems to have chronic anemia. CXR shows resultion April 2018. Echocardiogram 10/01/2016: shows left ventricle ejection fraction 55% with grade 1 diastolic dysfunction  Currently he has improved in terms of his chest x-ray. He still has some post pneumonia fatigue but otherwise is feeling stable.  He particularly denies any clinical diagnosis of sarcoidosis in his brain or eyes or skin or renal system although he is known to have high elevated creatinine for a few years which has been presumed due to hypertension. He has never seen a nephrologist.    OV 08/26/2017  Chief Complaint  Patient presents with  . Follow-up    sarcoidosis- feeling good slight congestion, getting over a cold    Follow-up sarcoidosis diagnosed in 2017 in the form of diffuse mediastinal and abdominal lymphadenopathy and cervical lymphadenopathy with some pulmonary infiltrates.  Started sometime in  fall 2017 methotrexate and prednisone  I personally saw him in May 2018.  At the time he was on 20 mg of methotrexate a week.  I reduce it to 10 mg/week.  I had him continue his daily prednisone.  Since that time he has had a partial left nephrectomy because of my referral to nephrologist because of increased creatinine.  This was done towards the end of 2018.  He is  also seen Dr. Estanislado Cruz rheumatologist.  She has him now tapering on prednisone.  He is essentially asymptomatic from a respiratory standpoint.  2 days ago he picked up a cold but he is recovering well from that with Mike Cruz.  There are no other new issues no shortness of breath wheezing or cough or chills or B symptoms.  Latest creatinine shows chronic kidney insufficiency but this being followed by Dr. Madelon Cruz   OV 12/22/2017  Chief Complaint  Patient presents with  . Follow-up    Breathing is improved since last OV. Denies issues with SOB, chest tightness, wheezing or coughing.    Follow-up sarcoidosis diagnosed in 2017 in the form of diffuse mediastinal and abdominal lymphadenopathy and cervical lymphadenopathy with some pulmonary infiltrates.  Started sometime in  fall 2017 methotrexate and prednisone. Off prednisone as of June 2019 and on methotrexate 7.64m per week in June 2019.  Status post left partial nephrectomy for renal cell carcinoma April 20, 2017  Since last visit with me earlier in the year he is doing well.  Denies any respiratory symptoms or shortness of breath cough wheezing orthopnea proximal nocturnal dyspnea hemoptysis edema fever chills.  In addition he also denies any B symptoms with suggest nocturnal sweats.  He visited Dr. D rheumatologist in March 2019 and has been tapered off prednisone.  He is on a lower dose of methotrexate 7.5 mg once a week along with daily folic acid.  Her primary function test today that shows significant improvement in lung function.  Last chest x-ray was in April 2018 that was essentially clear except for some upper lobe infiltrates.  At this point is wondering if he can make further inroads into tapering his methotrexate.  Recent lab work is all characterized below and the look normal     Results for Mike Cruz(MRN [id number] as of 12/22/2017 14:53  Ref. Range 12/10/2016 16:07 12/22/2017 13:50  FVC-Pre Latest Units: L 3.68 4.17  FVC-%Pred-Pre Latest Units: % 94 108  Results for PJACKY, Cruz(MRN [id number] as of 12/22/2017 14:53  Ref. Range 12/10/2016 16:07 12/22/2017 13:50  DLCO cor Latest Units: ml/min/mmHg 20.47 27.74  DLCO cor % pred Latest Units: % 68 92   Results for PALVIN, Cruz(MRN [id number] as of 12/22/2017 14:53  Ref. Range 11/24/2017 14:08  Creatinine Latest Ref Range: 0.70 - 1.33 mg/dL 1.99 (H)  Results for PNACHUM, Cruz(MRN  [id number] as of 12/22/2017 14:53  Ref. Range 11/24/2017 14:08  Hemoglobin Latest Ref Range: 13.2 - 17.1 g/dL 11.4 (L)  Results for PCACHE, Cruz(MRN [id number] as of 12/22/2017 14:53  Ref. Range 11/24/2017 14:08  WBC Latest Ref Range: 3.8 - 10.8 Thousand/uL 6.5  Results for PMUHAMMAD, VACCA(MRN [id number] as of 12/22/2017 14:53  Ref. Range 11/24/2017 14:08  AST Latest Ref Range: 10 - 35 U/L 26  ALT Latest Ref Range: 9 - 46 U/L 23    has a past medical history of Acute [place] failure ([place]F) ([place], Acute respiratory failure ([place], Anemia, Family history of colon cancer, Family history of kidney cancer, Family history of ovarian cancer, Family history of prostate cancer, Family history of stomach cancer, History of kidney stones, Hypertension, Hyponatremia, Kidney stone, Pneumonia, and Sarcoidosis.   reports that he has never smoked. He has never used smokeless tobacco.  Past Surgical History:  Procedure Laterality Date  . HEAD & NECK SKIN LESION EXCISIONAL BIOPSY    . KNEE SURGERY Left    Arthroscopic  . partial nephrectomt  Left 04/20/17 Dr. Tresa Moore  . ROBOTIC ASSITED PARTIAL NEPHRECTOMY Left 04/20/2017   Procedure: XI ROBOTIC ASSITED PARTIAL NEPHRECTOMY;  Surgeon: Alexis Frock, MD;  Location: [facility];  Service: Urology;  Laterality: Left;    Allergies  Allergen Reactions  . Penicillins Hives    Denies airway involvement    Immunization History  Administered Date(s) Administered  . Influenza,inj,Quad PF,6+ Mos 04/18/2016, 04/21/2017    Family History  Problem Relation Age of Onset  . Ovarian cancer Mother 21  . AAA (abdominal aortic aneurysm) Father   . Prostate cancer Father 36  . Kidney cancer Father 69       was 2 separate primaries, not a met  . Stroke Sister 50  . Healthy Daughter   . Healthy Daughter   . Healthy Son   . Lung cancer Maternal Aunt   . Stomach cancer Maternal Grandmother 27  . Colon cancer Maternal Grandmother 23  . Prostate cancer Maternal Grandfather 65        metastatic  . Lymphoma Cousin   . Prostate cancer Cousin 50     Current Outpatient Medications:  .  Calcium Carb-Cholecalciferol (CALCIUM-VITAMIN D) 500-200 MG-UNIT tablet, Take 1 tablet by mouth daily., Disp: , Rfl:  .  folic acid (FOLVITE) 1 MG tablet, Take 1 tablet (1 mg total) by mouth daily., Disp: 90 tablet, Rfl: 3 .  methotrexate (RHEUMATREX) 2.5 MG tablet, Take 3 tablets by mouth every week on Thursdays, Disp: 40 tablet, Rfl: 2 .  Olmesartan-Amlodipine-HCTZ 40-5-12.5 MG TABS, Take 1 tablet by mouth every morning., Disp: , Rfl: 3   Review of Systems     Objective:   Physical Exam  Constitutional: He is oriented to person, place, and time. He appears well-developed and well-nourished. No distress.  HENT:  Head: Normocephalic and atraumatic.  Right Ear: External ear normal.  Left Ear: External ear normal.  Mouth/Throat: Oropharynx is clear and moist. No oropharyngeal exudate.  Eyes: Pupils are equal, round, and reactive to light. Conjunctivae and EOM are normal. Right eye exhibits no discharge. Left eye exhibits no discharge. No scleral icterus.  Neck: Normal range of motion. Neck supple. No JVD present. No tracheal deviation present. No thyromegaly present.  Cardiovascular: Normal rate, regular rhythm and intact distal pulses. Exam reveals no gallop and no friction rub.  No murmur heard. Pulmonary/Chest: Effort normal and breath sounds normal. No respiratory distress. He has no wheezes. He has no rales. He exhibits no tenderness.  Abdominal: Soft. Bowel sounds are normal. He exhibits no distension and no mass. There is no tenderness. There is no rebound and no guarding.  Visceral obesity  Musculoskeletal: Normal range of motion. He exhibits no edema or tenderness.  Lymphadenopathy:    He has no cervical adenopathy.  Neurological: He is alert and oriented to person, place, and time. He has normal reflexes. No cranial nerve deficit. Coordination normal.  Skin: Skin is warm  and dry. No rash noted. He is not diaphoretic. No erythema. No pallor.  Psychiatric: He has a normal mood and affect. His behavior is normal. Judgment and thought content normal.  Nursing note and vitals reviewed.   Vitals:   12/22/17 1410  BP: 112/78  Pulse: 75  SpO2: 98%  Weight: 241 lb (109.3 kg)  Height: 5' 9"  (1.753 m)    Estimated body mass index is 35.59 kg/m as calculated from the following:   Height as of this encounter: 5' 9"  (1.753 m).   Weight as of this encounter: 241 lb (  109.3 kg).       Assessment:       ICD-10-CM   1. Sarcoidosis D86.9        Plan:      Asymptomatic Improved lung function Above real good news Glad you are off prednisone  Plan Reduce but continue methotrexate at 29m per week (2 pills per week) Continue folic acid   Followup 3 months do CXR 2 view Return to see Dr RChase Callerin 3 months; if well will taper methotrexate even further     Dr. MBrand Males M.D., FS[facility] IncC.Mike [facility]monary [facility] Medicine Staff Physician, CGriffinDirector - Interstitial Lung Disease  Program  Pulmonary [place] [address] Pager: [phone] If no answer or between  15:00h - 7:00h: call [phone] Telephone: [phone]

## 2017-12-22 NOTE — Progress Notes (Signed)
PFT completed today.  

## 2017-12-22 NOTE — Patient Instructions (Addendum)
ICD-10-CM   1. Sarcoidosis D86.9     Asymptomatic Improved lung function Above real good news Glad you are off prednisone  Plan Reduce but continue methotrexate at 5mg  per week (2 pills per week) Continue folic acid   Followup 3 months do CXR 2 view Return to see Dr Chase Caller in 3 months; if well will taper methotrexate even further

## 2017-12-26 ENCOUNTER — Other Ambulatory Visit: Payer: Self-pay | Admitting: Nephrology

## 2017-12-26 DIAGNOSIS — K862 Cyst of pancreas: Secondary | ICD-10-CM

## 2018-01-10 ENCOUNTER — Ambulatory Visit
Admission: RE | Admit: 2018-01-10 | Discharge: 2018-01-10 | Disposition: A | Discharge status: Home / Self Care | Payer: BC Managed Care – PPO | Source: Ambulatory Visit | Attending: Nephrology | Admitting: Nephrology

## 2018-01-10 DIAGNOSIS — K862 Cyst of pancreas: Secondary | ICD-10-CM

## 2018-01-10 MED ORDER — GADOBENATE DIMEGLUMINE 529 MG/ML IV SOLN
10.0000 mL | Freq: Once | INTRAVENOUS | Status: AC | PRN
Start: 2018-01-10 — End: 2018-01-10
  Administered 2018-01-10: 10 mL via INTRAVENOUS

## 2018-01-27 ENCOUNTER — Telehealth: Payer: Self-pay | Admitting: Oncology

## 2018-01-27 ENCOUNTER — Encounter: Payer: Self-pay | Admitting: Oncology

## 2018-01-27 NOTE — Telephone Encounter (Signed)
New referral received from Dr. Hollie Salk. Pt has been scheduled to see Dr. Alen Blew on 7/19 at 2pm. Pt aware to arrive 30 minutes early. Letter mailed.

## 2018-02-03 ENCOUNTER — Inpatient Hospital Stay: Payer: BC Managed Care – PPO | Attending: Genetic Counselor | Admitting: Oncology

## 2018-02-03 VITALS — BP 150/70 | HR 80 | Temp 98.2°F | Resp 18 | Ht 69.0 in | Wt 246.0 lb

## 2018-02-03 DIAGNOSIS — Z800 Family history of malignant neoplasm of digestive organs: Secondary | ICD-10-CM | POA: Insufficient documentation

## 2018-02-03 DIAGNOSIS — Z79899 Other long term (current) drug therapy: Secondary | ICD-10-CM | POA: Insufficient documentation

## 2018-02-03 DIAGNOSIS — D869 Sarcoidosis, unspecified: Secondary | ICD-10-CM | POA: Insufficient documentation

## 2018-02-03 DIAGNOSIS — N189 Chronic kidney disease, unspecified: Secondary | ICD-10-CM

## 2018-02-03 DIAGNOSIS — I129 Hypertensive chronic kidney disease with stage 1 through stage 4 chronic kidney disease, or unspecified chronic kidney disease: Secondary | ICD-10-CM | POA: Diagnosis not present

## 2018-02-03 DIAGNOSIS — Z8042 Family history of malignant neoplasm of prostate: Secondary | ICD-10-CM | POA: Diagnosis not present

## 2018-02-03 DIAGNOSIS — Z8041 Family history of malignant neoplasm of ovary: Secondary | ICD-10-CM | POA: Diagnosis not present

## 2018-02-03 DIAGNOSIS — C649 Malignant neoplasm of unspecified kidney, except renal pelvis: Secondary | ICD-10-CM

## 2018-02-03 DIAGNOSIS — Z85528 Personal history of other malignant neoplasm of kidney: Secondary | ICD-10-CM | POA: Insufficient documentation

## 2018-02-03 DIAGNOSIS — Z87442 Personal history of urinary calculi: Secondary | ICD-10-CM | POA: Insufficient documentation

## 2018-02-03 DIAGNOSIS — Z8051 Family history of malignant neoplasm of kidney: Secondary | ICD-10-CM | POA: Insufficient documentation

## 2018-02-03 DIAGNOSIS — K869 Disease of pancreas, unspecified: Secondary | ICD-10-CM | POA: Insufficient documentation

## 2018-02-03 DIAGNOSIS — M899 Disorder of bone, unspecified: Secondary | ICD-10-CM | POA: Diagnosis present

## 2018-02-03 NOTE — Progress Notes (Signed)
Reason for the request:    Kidney cancer  HPI: I was asked by Dr. Hollie Salk to evaluate Mike Cruz for history of kidney cancer.  He is a pleasant 52 year old man currently of [place] where he works at [employer].  He has a history of chronic renal insufficiency, hypertension and sarcoidosis.  He had an MRI obtained in June 2018 for evaluation for kidney cyst and showed a 1.8 cm solid mass in the posterior interpolar left kidney suspicious for renal neoplasm.  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  He recovered well from his operation had a repeat MRI done in June 2019 for a follow-up for his pancreatic cyst.  The MRI showed a T12 hemangioma that was stable but there appeared to be several other small enhancing bone lesions that appeared worrisome. A bone scan was recommended.  Small stable cystic pancreatic lesions were noted and no other abnormalities at this time.  Clinically, he reports she is feeling well without any recent complaints.  He is on methotrexate after being tapered off prednisone completely for his sarcoidosis.  He denies any back pain or discomfort.  He denies any pathological fractures.  He remains active and attends to activities of daily living.  He does not report any headaches, blurry vision, syncope or seizures. Does not report any fevers, chills or sweats.  Does not report any cough, wheezing or hemoptysis.  Does not report any chest pain, palpitation, orthopnea or leg edema.  Does not report any nausea, vomiting or abdominal pain.  Does not report any constipation or diarrhea.  Does not report any skeletal complaints.    Does not report frequency, urgency or hematuria.  Does not report any skin rashes or lesions. Does not report any heat or cold intolerance.  Does not report any lymphadenopathy or petechiae.   Does not report any anxiety or depression.  Remaining review of systems is negative.    Past Medical History:  Diagnosis Date  . Acute renal failure (ARF) ([place])   . Acute respiratory failure ([place])   . Anemia   . Family history of colon cancer   . Family history of kidney cancer   . Family history of ovarian cancer   . Family history of prostate cancer   . Family history of stomach cancer   . History of kidney stones   . Hypertension   . Hyponatremia   . Kidney stone    kidney mass 04/13/17  . Pneumonia   . Sarcoidosis   :  Past Surgical History:  Procedure Laterality Date  . HEAD & NECK SKIN LESION EXCISIONAL BIOPSY    . KNEE SURGERY Left    Arthroscopic  . partial nephrectomt     Left 04/20/17 Dr. Tresa Moore  . ROBOTIC ASSITED PARTIAL NEPHRECTOMY Left 04/20/2017   Procedure: XI ROBOTIC ASSITED PARTIAL NEPHRECTOMY;  Surgeon: Alexis Frock, MD;  Location: [facility];  Service: Urology;  Laterality: Left;  :   Current Outpatient Medications:  .  Calcium Carb-Cholecalciferol (CALCIUM-VITAMIN D) 500-200 MG-UNIT tablet, Take 1 tablet by mouth daily., Disp: , Rfl:  .  folic acid (FOLVITE) 1 MG tablet, Take 1 tablet (1 mg total) by mouth daily., Disp: 90 tablet, Rfl: 3 .  methotrexate (RHEUMATREX) 2.5 MG tablet, Take 3 tablets by mouth every week on Thursdays, Disp: 40 tablet, Rfl: 2 .  Olmesartan-Amlodipine-HCTZ 40-5-12.5 MG TABS, Take 1 tablet by mouth every morning., Disp: , Rfl: 3:  Allergies  Allergen Reactions  . Penicillins Hives    Denies airway involvement  :  Family History  Problem Relation Age of Onset  . Ovarian cancer Mother 44  . AAA (abdominal aortic aneurysm) Father   . Prostate cancer Father 32  . Kidney cancer Father 37       was 2 separate primaries, not a met  . Stroke Sister 37  . Healthy Daughter   . Healthy Daughter   . Healthy Son   . Lung cancer Maternal Aunt   . Stomach cancer Maternal Grandmother 26  . Colon cancer Maternal Grandmother 37  .  Prostate cancer Maternal Grandfather 65       metastatic  . Lymphoma Cousin   . Prostate cancer Cousin 63  :  Social History   Socioeconomic History  . Marital status: Divorced    Spouse name: Not on file  . Number of children: Not on file  . Years of education: Not on file  . Highest education level: Not on file  Occupational History  . Occupation: Programmer, multimedia: [employer]  Social Needs  . Financial resource strain: Not on file  . Food insecurity:    Worry: Not on file    Inability: Not on file  . Transportation needs:    Medical: Not on file    Non-medical: Not on file  Tobacco Use  . Smoking status: Never Smoker  . Smokeless tobacco: Never Used  Substance and Sexual Activity  . Alcohol use: No  . Drug use: Never  . Sexual activity: Yes  Lifestyle  . Physical activity:    Days per week: Not on file    Minutes per session: Not on file  . Stress: Not on file  Relationships  . Social connections:    Talks on phone: Not on file    Gets together: Not on file    Attends religious service: Not on file    Active member of club or organization: Not on file    Attends meetings of clubs or organizations: Not on file    Relationship status: Not on file  . Intimate partner violence:    Fear of current or ex partner: Not on file    Emotionally abused: Not on file    Physically abused: Not on file    Forced sexual activity: Not on file  Other Topics Concern  . Not on file  Social History Narrative  . Not on file  :  Pertinent items are noted in HPI.  Exam: Blood pressure (!) 150/70, pulse 80, temperature 98.2 F (36.8 C), temperature source Oral, resp. rate 18, height 5' 9" (1.753 m), weight 246 lb (111.6 kg), SpO2 100 %.  ECOG 0 General appearance: alert and cooperative appeared without distress. Head: atraumatic without any abnormalities. Eyes: conjunctivae/corneas clear. PERRL.  Sclera anicteric. Throat: lips, mucosa, and tongue normal; without oral thrush or  ulcers. Resp: clear to auscultation bilaterally without rhonchi, wheezes or dullness to percussion. Cardio: regular rate and rhythm, S1, S2 normal, no murmur, click, rub or gallop GI: soft, non-tender; bowel sounds normal; no masses,  no organomegaly Skin: Skin color, texture, turgor normal. No rashes or lesions Lymph nodes: Cervical, supraclavicular, and axillary nodes normal. Neurologic: Grossly normal without any motor, sensory or deep tendon reflexes. Musculoskeletal: No joint deformity or effusion.  CBC    Component Value Date/Time  WBC 6.5 11/24/2017 1408   RBC 3.84 (L) 11/24/2017 1408   HGB 11.4 (L) 11/24/2017 1408   HCT 33.9 (L) 11/24/2017 1408   PLT 171 11/24/2017 1408   MCV 88.3 11/24/2017 1408   MCH 29.7 11/24/2017 1408   MCHC 33.6 11/24/2017 1408   RDW 12.6 11/24/2017 1408   LYMPHSABS 1,762 11/24/2017 1408   MONOABS 534 02/25/2017 1313   EOSABS 130 11/24/2017 1408   BASOSABS 39 11/24/2017 1408     Chemistry      Component Value Date/Time   NA 142 11/24/2017 1408   K 4.1 11/24/2017 1408   CL 107 11/24/2017 1408   CO2 29 11/24/2017 1408   BUN 22 11/24/2017 1408   CREATININE 1.99 (H) 11/24/2017 1408      Component Value Date/Time   CALCIUM 10.0 11/24/2017 1408   ALKPHOS 64 02/25/2017 1313   AST 26 11/24/2017 1408   ALT 23 11/24/2017 1408   BILITOT 0.4 11/24/2017 1408       Mr Abdomen Wwo Contrast  Result Date: 01/10/2018 CLINICAL DATA:  Status post partial left nephrectomy for renal cell cancer 1 year ago. Pancreatic cysts. EXAM: MRI ABDOMEN WITHOUT AND WITH CONTRAST TECHNIQUE: Multiplanar multisequence MR imaging of the abdomen was performed both before and after the administration of intravenous contrast. CONTRAST:  74m MULTIHANCE GADOBENATE DIMEGLUMINE 529 MG/ML IV SOLN COMPARISON:  CT scan 10/24/2017 and MRI 01/04/2017 FINDINGS: Lower chest: The lung bases are clear. No worrisome pulmonary lesions. No pleural or pericardial effusion. Hepatobiliary: No  focal hepatic lesions or intrahepatic biliary dilatation. The gallbladder is normal. No common bile duct dilatation. Pancreas: Stable small scattered pancreatic cystic lesions. The 10 mm cyst in the uncinate process is unchanged. The 13 mm cyst in the body tail junction region is stable. Several other smaller pancreatic cysts are unchanged. No worrisome MR imaging features such as contrast enhancement. These are most likely postinflammatory. Spleen:  Normal size.  No focal lesions. Adrenals/Urinary Tract: The adrenal glands are unremarkable and stable. The right kidney demonstrates small simple appearing cysts along with a small stable hemorrhagic cyst involving the interpolar region posteriorly. This measures 10 mm. Stable surgical changes involving the left kidney from a prior partial nephrectomy. No findings suspicious for residual or recurrent tumor. Stomach/Bowel: Visualized portions within the abdomen are unremarkable. Vascular/Lymphatic: Prominent celiac axis node measures 2 cm on image 17. Is also a 19 mm node just anterior to the IVC. These appear relatively stable. No retroperitoneal adenopathy. The major vascular structures are patent. Other:  No ascites or abdominal wall hernia. Musculoskeletal: There is a stable T12 hemangioma. There appear to be several other small enhancing bone lesions which I do not see for certain on the prior study and are somewhat worrisome. IMPRESSION: 1. Status post partial left nephrectomy with no findings suspicious for residual or recurrent tumor. 2. Stable celiac axis and hepatoduodenal ligament lymph nodes. No retroperitoneal adenopathy. 3. Stable benign-appearing T12 hemangioma. There appear to be new small enhancing vertebral body lesions. Could not exclude metastatic disease. Whole-body bone scan or PET-CT may be helpful for further evaluation. 4. Several small stable cystic pancreatic lesions, most likely postinflammatory. Electronically Signed   By: PMarijo SanesM.D.    On: 01/10/2018 13:24    Assessment and Plan:   52year old man with the following:  1.  Vertebral body lesion suspicious for recurrent malignancy.  This was detected on MRI obtained on 01/10/2018 and the setting of a renal cell carcinoma that was removed a  year ago.  Imaging studies obtained on that day and compared to June 2018 were reviewed today and discussed with the patient.  The differential diagnosis was also reviewed.  These findings likely suggest either benign etiology including hemangioma versus less likely metastatic disease from renal cell carcinoma or other malignancy.  From a management standpoint I have recommended obtaining bone scan and potentially dedicated MRI of the spine if needed to if this was not diagnostic.  And depending on on the results tissue biopsy may be needed versus continued observation and surveillance.  This was discussed today with the patient and is agreeable with this plan.  We will start with obtaining a bone scan and follow-up on the results moving there.  2.  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  I doubt these recent findings are related to his previous tumor but always a possibility.  3.  Sarcoidosis: He has a repeat scan of the chest in the near future.  He is currently on tapering doses of methotrexate.  He has no symptoms related to his condition.  4.  Follow-up: Will be determined depending on the results of his bone scan.  30  minutes was spent with the patient face-to-face today.  More than 50% of time was dedicated to patient counseling, education and coordination of his care.     Thank you for the referral. A copy of this consult has been forwarded to the requesting physician.

## 2018-02-13 ENCOUNTER — Encounter (HOSPITAL_COMMUNITY)
Admission: RE | Admit: 2018-02-13 | Discharge: 2018-02-13 | Disposition: A | Discharge status: Home / Self Care | Payer: BC Managed Care – PPO | Source: Ambulatory Visit | Attending: Oncology | Admitting: Oncology

## 2018-02-13 DIAGNOSIS — C649 Malignant neoplasm of unspecified kidney, except renal pelvis: Secondary | ICD-10-CM | POA: Insufficient documentation

## 2018-02-13 MED ORDER — TECHNETIUM TC 99M MEDRONATE IV KIT
21.2000 | PACK | Freq: Once | INTRAVENOUS | Status: AC | PRN
  Administered 2018-02-13: 21.2 via INTRAVENOUS

## 2018-02-14 ENCOUNTER — Encounter: Payer: Self-pay | Admitting: Oncology

## 2018-02-14 ENCOUNTER — Other Ambulatory Visit: Payer: Self-pay | Admitting: Oncology

## 2018-02-14 DIAGNOSIS — C649 Malignant neoplasm of unspecified kidney, except renal pelvis: Secondary | ICD-10-CM

## 2018-02-14 NOTE — Progress Notes (Signed)
The results of the bone scan noted and discussed with Dr. Leonia Reeves the reviewing radiologist.  The findings are suspicious but not diagnostic for metastatic disease.  He recommended that proceeding with a CT scan of the chest for further characterization.  I discussed these findings with [name] over the phone today and is agreeable with the plan.  Further imaging studies may be needed including a PET CT scan and potentially a biopsy if these findings continues to suggest malignancy.

## 2018-02-16 ENCOUNTER — Ambulatory Visit (HOSPITAL_COMMUNITY)
Admission: RE | Admit: 2018-02-16 | Discharge: 2018-02-16 | Disposition: A | Discharge status: Home / Self Care | Payer: BC Managed Care – PPO | Source: Ambulatory Visit | Attending: Oncology | Admitting: Oncology

## 2018-02-16 DIAGNOSIS — D869 Sarcoidosis, unspecified: Secondary | ICD-10-CM | POA: Diagnosis not present

## 2018-02-16 DIAGNOSIS — C649 Malignant neoplasm of unspecified kidney, except renal pelvis: Secondary | ICD-10-CM | POA: Diagnosis present

## 2018-02-16 DIAGNOSIS — R918 Other nonspecific abnormal finding of lung field: Secondary | ICD-10-CM | POA: Diagnosis not present

## 2018-02-16 DIAGNOSIS — R590 Localized enlarged lymph nodes: Secondary | ICD-10-CM | POA: Insufficient documentation

## 2018-02-16 MED ORDER — IOHEXOL 300 MG/ML  SOLN
75.0000 mL | Freq: Once | INTRAMUSCULAR | Status: AC | PRN
  Administered 2018-02-16: 75 mL via INTRAVENOUS

## 2018-02-22 ENCOUNTER — Other Ambulatory Visit: Payer: Self-pay

## 2018-02-22 DIAGNOSIS — Z79899 Other long term (current) drug therapy: Secondary | ICD-10-CM

## 2018-02-22 LAB — CBC WITH DIFFERENTIAL/PLATELET
BASOS ABS: 40 {cells}/uL (ref 0–200)
Basophils Relative: 0.6 %
EOS ABS: 172 {cells}/uL (ref 15–500)
EOS PCT: 2.6 %
HEMATOCRIT: 35.9 % — AB (ref 38.5–50.0)
HEMOGLOBIN: 11.8 g/dL — AB (ref 13.2–17.1)
Lymphs Abs: 1861 cells/uL (ref 850–3900)
MCH: 29.1 pg (ref 27.0–33.0)
MCHC: 32.9 g/dL (ref 32.0–36.0)
MCV: 88.4 fL (ref 80.0–100.0)
MONOS PCT: 8.7 %
MPV: 11.5 fL (ref 7.5–12.5)
NEUTROS PCT: 59.9 %
Neutro Abs: 3953 cells/uL (ref 1500–7800)
Platelets: 158 10*3/uL (ref 140–400)
RBC: 4.06 10*6/uL — ABNORMAL LOW (ref 4.20–5.80)
RDW: 12.4 % (ref 11.0–15.0)
Total Lymphocyte: 28.2 %
WBC mixed population: 574 cells/uL (ref 200–950)
WBC: 6.6 10*3/uL (ref 3.8–10.8)

## 2018-02-22 LAB — COMPLETE METABOLIC PANEL WITH GFR
AG RATIO: 1.4 (calc) (ref 1.0–2.5)
ALT: 26 U/L (ref 9–46)
AST: 30 U/L (ref 10–35)
Albumin: 4.4 g/dL (ref 3.6–5.1)
Alkaline phosphatase (APISO): 70 U/L (ref 40–115)
BILIRUBIN TOTAL: 0.4 mg/dL (ref 0.2–1.2)
BUN/Creatinine Ratio: 11 (calc) (ref 6–22)
BUN: 23 mg/dL (ref 7–25)
CALCIUM: 10 mg/dL (ref 8.6–10.3)
CHLORIDE: 105 mmol/L (ref 98–110)
CO2: 27 mmol/L (ref 20–32)
Creat: 2.09 mg/dL — ABNORMAL HIGH (ref 0.70–1.33)
GFR, EST AFRICAN AMERICAN: 40 mL/min/{1.73_m2} — AB (ref >=60)
GFR, EST NON AFRICAN AMERICAN: 35 mL/min/{1.73_m2} — AB (ref >=60)
GLOBULIN: 3.1 g/dL (ref 1.9–3.7)
Glucose, Bld: 92 mg/dL (ref 65–99)
POTASSIUM: 4.3 mmol/L (ref 3.5–5.3)
Sodium: 140 mmol/L (ref 135–146)
TOTAL PROTEIN: 7.5 g/dL (ref 6.1–8.1)

## 2018-02-23 NOTE — Progress Notes (Signed)
Creatinine and GFR stable.  Please forward to nephrologist.   Anemia stable.

## 2018-03-01 ENCOUNTER — Other Ambulatory Visit: Payer: Self-pay | Admitting: Oncology

## 2018-03-01 ENCOUNTER — Telehealth: Payer: Self-pay | Admitting: *Deleted

## 2018-03-01 DIAGNOSIS — D869 Sarcoidosis, unspecified: Secondary | ICD-10-CM

## 2018-03-01 DIAGNOSIS — M899 Disorder of bone, unspecified: Secondary | ICD-10-CM

## 2018-03-01 DIAGNOSIS — C649 Malignant neoplasm of unspecified kidney, except renal pelvis: Secondary | ICD-10-CM

## 2018-03-01 NOTE — Telephone Encounter (Signed)
Patient calling to ask if dr Alen Blew has reviewed his case at the tumor board meeting?

## 2018-03-01 NOTE — Telephone Encounter (Signed)
I called the patient today to discuss these issues with him today. His case was discussed in the [facility] tumor board on February 24, 2018.  Imaging studies were reviewed as well as discussion with pathology, radiology and urology.  The general consensus that the likelihood of malignancy is small but the prudent thing to do is to repeat MR of the spine in the next few months and monitor for any changes.  If nondetected, then no further imaging studies will be needed from an oncology standpoint.

## 2018-03-22 ENCOUNTER — Ambulatory Visit: Payer: BC Managed Care – PPO | Admitting: Internal Medicine

## 2018-03-22 ENCOUNTER — Encounter: Payer: Self-pay | Admitting: Internal Medicine

## 2018-03-22 VITALS — BP 132/80 | HR 60 | Ht 69.0 in | Wt 244.0 lb

## 2018-03-22 DIAGNOSIS — Z23 Encounter for immunization: Secondary | ICD-10-CM

## 2018-03-22 DIAGNOSIS — R918 Other nonspecific abnormal finding of lung field: Secondary | ICD-10-CM

## 2018-03-22 DIAGNOSIS — R911 Solitary pulmonary nodule: Secondary | ICD-10-CM | POA: Diagnosis not present

## 2018-03-22 DIAGNOSIS — D869 Sarcoidosis, unspecified: Secondary | ICD-10-CM

## 2018-03-22 NOTE — Patient Instructions (Addendum)
ICD-10-CM   1. Sarcoidosis D86.9   2. Multiple lung nodules on CT R91.8     CT chest aug 2019 compared to aug 2017 shows response in sarcoidosis  - there are mild enlarged lymph glands (improved though) and some nodules on lung CT are probably sarcoid but too small to biopsy  Plan Reduce methotrexate to 2.5mg  tablet (1 tab) once a week Continue folic acid In 6 months do Pre-bd spiro and dlco only. No lung volume or bd response. No post-bd spiro Flu shot 03/22/2018  Followup 6 months but after spirometry - to[facility] clinic 12 months do repeat ct chest without contrast

## 2018-03-22 NOTE — Progress Notes (Signed)
IOV 11/22/2016  Chief Complaint  Patient presents with  . Pulmonary Consult    Pt referred by Dr. Inda Merlin for sarcoid. Pt c/o SOB with activity x 9 months. Pt states he was recently hospitalized for BIL pna. Pt c/o non prod cough, chest congstion. Pt denies f/c/s and CP/tightness.     52 year old male. He is a [name]. 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 He tells me that starting July August 2017 started noticing left-sided cervical adenopathy associated with some night sweats and mild shortness of breath and fatigue. Workup in August 2017 : 03/11/2016 had CT chest and abdomen pelvis: I personally visualized this film and it is significant for diffuse adenopathy in the mediastinal and abdominal areas. 03/12/2016 the following day he underwent cervical lymph node biopsy by Dr. Arville Care and this showed granulomatous lymphadenitis.. Tthis led to the diagnosis of sarcoidosis. On the CT chest as pulmonary lung fields had just some scattered mild nodularity which is reflected on the chest x-ray from 10/19/2016 which I personally visualized that shows just mild interstitial prominence c/w aug 2017 films and improved from pneumonia admit march 2018. Then in September 2017 he was started on prednisone. By October November 2017 due to lack of resolution of the symptoms and the lymph node area he was referred to Dr.  Charlestine Night rheumatologist   (since retired) and was started on methotrexate. Patient states that he is on 10 mg of methotrexate on Thursday once weekly and 10 mg of methotrexate on Friday once weekly totaling over 20 mg of methotrexate weekly. He is on folic acid. With this he did improve upon his symptoms with the fatigue and shortness of breath and the lymphadenopathy. However he has not had follow-up CT imaging of his abdomen. Then in March 2018 was diagnosed with rhinovirus respiratory infection pneumonia and was admitted. HIV test was negative  at that time. He did have acute kidney injury which seemed to be improving as of 10/03/2016 but no follow-up labs since then on the computer. He also seems to have chronic anemia. CXR shows resultion April 2018. Echocardiogram 10/01/2016: shows left ventricle ejection fraction 55% with grade 1 diastolic dysfunction  Currently he has improved in terms of his chest x-ray. He still has some post pneumonia fatigue but otherwise is feeling stable.  He particularly denies any clinical diagnosis of sarcoidosis in his brain or eyes or skin or renal system although he is known to have high elevated creatinine for a few years which has been presumed due to hypertension. He has never seen a nephrologist.    OV 08/26/2017  Chief Complaint  Patient presents with  . Follow-up    sarcoidosis- feeling good slight congestion, getting over a cold    Follow-up sarcoidosis diagnosed in 2017 in the form of diffuse mediastinal and abdominal lymphadenopathy and cervical lymphadenopathy with some pulmonary infiltrates.  Started sometime in  fall 2017 methotrexate and prednisone  I personally saw him in May 2018.  At the time he was on 20 mg of methotrexate a week.  I reduce it to 10 mg/week.  I had him continue his daily prednisone.  Since that time he has had a partial left nephrectomy because of my referral to nephrologist because of increased creatinine.  This was done towards the end of 2018.  He is also seen Dr. Estanislado Pandy rheumatologist.  She has him now tapering on prednisone.  He is essentially asymptomatic from a respiratory  standpoint.  2 days ago he picked up a cold but he is recovering well from that with Alka-Seltzer.  There are no other new issues no shortness of breath wheezing or cough or chills or B symptoms.  Latest creatinine shows chronic kidney insufficiency but this being followed by Dr. Madelon Lips   OV 12/22/2017  Chief Complaint  Patient presents with  . Follow-up    Breathing is improved  since last OV. Denies issues with SOB, chest tightness, wheezing or coughing.    Follow-up sarcoidosis diagnosed in 2017 in the form of diffuse mediastinal and abdominal lymphadenopathy and cervical lymphadenopathy with some pulmonary infiltrates.  Started sometime in  fall 2017 methotrexate and prednisone. Off prednisone as of June 2019 and on methotrexate 7.61m per week in June 2019.  Status post left partial nephrectomy for renal cell carcinoma April 20, 2017  Since last visit with me earlier in the year he is doing well.  Denies any respiratory symptoms or shortness of breath cough wheezing orthopnea proximal nocturnal dyspnea hemoptysis edema fever chills.  In addition he also denies any B symptoms with suggest nocturnal sweats.  He visited Dr. D rheumatologist in March 2019 and has been tapered off prednisone.  He is on a lower dose of methotrexate 7.5 mg once a week along with daily folic acid.  Her primary function test today that shows significant improvement in lung function.  Last chest x-ray was in April 2018 that was essentially clear except for some upper lobe infiltrates.  At this point is wondering if he can make further inroads into tapering his methotrexate.  Recent lab work is all characterized below and the look normal     Results for PDELANEY, SCHNICK(MRN [id number] as of 12/22/2017 14:53  Ref. Range 12/10/2016 16:07 12/22/2017 13:50  FVC-Pre Latest Units: L 3.68 4.17  FVC-%Pred-Pre Latest Units: % 94 108  Results for PZAIDE, KARDELL(MRN [id number] as of 12/22/2017 14:53  Ref. Range 12/10/2016 16:07 12/22/2017 13:50  DLCO cor Latest Units: ml/min/mmHg 20.47 27.74  DLCO cor % pred Latest Units: % 68 92   Results for PJOHNPATRICK, JENNY(MRN [id number] as of 12/22/2017 14:53  Ref. Range 11/24/2017 14:08  Creatinine Latest Ref Range: 0.70 - 1.33 mg/dL 1.99 (H)  Results for PTRAYVEN, LUMADUE(MRN [id number] as of 12/22/2017 14:53  Ref. Range 11/24/2017 14:08  Hemoglobin Latest Ref Range: 13.2 - 17.1  g/dL 11.4 (L)  Results for PJARICK, HARKINS(MRN [id number] as of 12/22/2017 14:53  Ref. Range 11/24/2017 14:08  WBC Latest Ref Range: 3.8 - 10.8 Thousand/uL 6.5  Results for PTYREQUE, FINKEN(MRN [id number] as of 12/22/2017 14:53  Ref. Range 11/24/2017 14:08  AST Latest Ref Range: 10 - 35 U/L 26  ALT Latest Ref Range: 9 - 46 U/L 23     OV 03/22/2018  Subjective:  Patient ID: RIsabella Stalling male , DOB: 418-Jul-1967, age MRN: [id number], ADDRESS: [address] [id number]  03/22/2018 -   Chief Complaint  Patient presents with  . Follow-up    Pt states he has been doing well since last visit and denies any complaints.    Follow-up sarcoidosis diagnosed in 2017 in the form of diffuse mediastinal and abdominal lymphadenopathy and cervical lymphadenopathy with some pulmonary infiltrates.  Started sometime in  fall 2017 methotrexate and prednisone. Off prednisone as of June 2019 and on methotrexate 7.571mper week in June 2019.  Status post left partial nephrectomy for renal cell carcinoma April 20, 2017 . REduced methotrxate to 74m per day June 2019     RIsabella Stalling89y.o. -continues to do well. He is asymptomatic. In August 2019 he had workup for potential bone lesions according to his history and chart review. 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 During this time he had a CT chest which shows improved medicine lymphadenopathy very small levels and also multiple pulmonary nodules that are really small. One-year follow-up CT is recommended.He is currently on methotrexate 5 mg once a week along with folic acid.. I personally visualized the CT chest from August 2019     ROS  Per HPI   has a past medical history of Acute [place] failure ([place]F) ([place], Acute respiratory failure ([place], Anemia, Family history of colon cancer, Family history of kidney cancer, Family history of ovarian cancer, Family history of prostate cancer, Family history of stomach cancer, History of kidney  stones, Hypertension, Hyponatremia, Kidney stone, Pneumonia, and Sarcoidosis.   reports that he has never smoked. He has never used smokeless tobacco.  Past Surgical History:  Procedure Laterality Date  . HEAD & NECK SKIN LESION EXCISIONAL BIOPSY    . KNEE SURGERY Left    Arthroscopic  . partial nephrectomt     Left 04/20/17 Dr. MTresa Moore . ROBOTIC ASSITED PARTIAL NEPHRECTOMY Left 04/20/2017   Procedure: XI ROBOTIC ASSITED PARTIAL NEPHRECTOMY;  Surgeon: MAlexis Frock MD;  Location: [facility];  Service: Urology;  Laterality: Left;    Allergies  Allergen Reactions  . Penicillins Hives    Denies airway involvement    Immunization History  Administered Date(s) Administered  . Influenza,inj,Quad PF,6+ Mos 04/18/2016, 04/21/2017    Family History  Problem Relation Age of Onset  . Ovarian cancer Mother 63 . AAA (abdominal aortic aneurysm) Father   . Prostate cancer Father 592 . Kidney cancer Father 522      was 2 separate primaries, not a met  . Stroke Sister 472 . Healthy Daughter   . Healthy Daughter   . Healthy Son   . Lung cancer Maternal Aunt   . Stomach cancer Maternal Grandmother 77 . Colon cancer Maternal Grandmother 762 . Prostate cancer Maternal Grandfather 65       metastatic  . Lymphoma Cousin   . Prostate cancer Cousin 50     Current Outpatient Medications:  .  Calcium Carb-Cholecalciferol (CALCIUM-VITAMIN D) 500-200 MG-UNIT tablet, Take 1 tablet by mouth daily., Disp: , Rfl:  .  folic acid (FOLVITE) 1 MG tablet, Take 1 tablet (1 mg total) by mouth daily., Disp: 90 tablet, Rfl: 3 .  methotrexate (RHEUMATREX) 2.5 MG tablet, Take 3 tablets by mouth every week on Thursdays (Patient taking differently: Take 2 tablets by mouth every week on Thursdays), Disp: 40 tablet, Rfl: 2 .  Olmesartan-Amlodipine-HCTZ 40-5-12.5 MG TABS, Take 1 tablet by mouth every morning., Disp: , Rfl: 3      Objective:   Vitals:   03/22/18 0951  BP: 132/80  Pulse: 60  SpO2: 99%    Weight: 244 lb (110.7 kg)  Height: 5' 9"  (1.753 m)    Estimated body mass index is 36.03 kg/m as calculated from the following:   Height as of this encounter: 5' 9"  (1.753 m).   Weight as of this encounter: 244 lb (110.7 kg).  @WEIGHTCHANGE @  FA[place]  03/22/18 0951  Weight: 244 lb (110.7 kg)     Physical Exam  General Appearance:    Alert, cooperative, no  distress, appears stated age - yes , sitting on - chair  Head:    Normocephalic, without obvious abnormality, atraumatic  Eyes:    PERRL, conjunctiva/corneas clear,  Ears:    Normal TM's and external ear canals, both ears  Nose:   Nares normal, septum midline, mucosa normal, no drainage    or sinus tenderness. OXYGEN ON no. Currently on  @ room air  Throat:   Lips, mucosa, and tongue normal; teeth and gums normal. Cyanosis on lips - no  Neck:   Supple, symmetrical, trachea midline, no adenopathy;    thyroid:  no enlargement/tenderness/nodules; no carotid   bruit or JVD  Back:     Symmetric, no curvature, ROM normal, no CVA tenderness  Lungs:     Distress - no , Wheeze no, Barrell Chest - no, Purse lip breathing - no, Crackles - no   Chest Wall:    No tenderness or deformity. Scars in chest no   Heart:    Regular rate and rhythm, S1 and S2 normal, no murmur, rub   or gallop  Breast Exam:    NOT DONE  Abdomen:     Soft, non-tender, bowel sounds active all four quadrants,    no masses, no organomegaly  Genitalia:   NOT DONE  Rectal:   NOT DONE  Extremities:   Extremities normal, atraumatic, Clubbing - no, Edema - no  Pulses:   2+ and symmetric all extremities  Skin:   Stigmata of Connective Tissue Disease - no  Lymph nodes:   Cervical, supraclavicular, and axillary nodes normal  Psychiatric:  Neurologic:   normal CNII-XII intact, normal strength, sensation  throughout           Assessment:       ICD-10-CM   1. Sarcoidosis D86.9   2. Multiple lung nodules on CT R91.8        Plan:      CT chest aug  2019 compared to aug 2017 shows response in sarcoidosis  - there are mild enlarged lymph glands (improved though) and some nodules on lung CT are probably sarcoid but too small to biopsy  Plan Reduce methotrexate to 2.19m tablet (1 tab) once a week Continue folic acid In 6 months do Pre-bd spiro and dlco only. No lung volume or bd response. No post-bd spiro Flu shot 03/22/2018  Followup 6 months but after spirometry - tolung clinic 12 months do repeat ct chest without contrast           SIGNATURE    Dr. MBrand Males M.D., F.C.C.P,  Pulmonary and Critical Care Medicine Staff Physician, CPelican BayDirector - Interstitial Lung Disease  Program  Pulmonary [place] [id number] Pager: [phone] If no answer or between  15:00h - 7:00h: call [phone] Telephone: [phone]  10:17 AM 03/22/2018

## 2018-03-28 ENCOUNTER — Encounter: Payer: Self-pay | Admitting: Oncology

## 2018-03-28 ENCOUNTER — Ambulatory Visit (HOSPITAL_COMMUNITY)
Admission: RE | Admit: 2018-03-28 | Discharge: 2018-03-28 | Disposition: A | Discharge status: Home / Self Care | Payer: BC Managed Care – PPO | Source: Ambulatory Visit | Attending: Oncology | Admitting: Oncology

## 2018-03-28 DIAGNOSIS — D869 Sarcoidosis, unspecified: Secondary | ICD-10-CM

## 2018-03-28 DIAGNOSIS — C649 Malignant neoplasm of unspecified kidney, except renal pelvis: Secondary | ICD-10-CM | POA: Diagnosis not present

## 2018-03-28 DIAGNOSIS — M899 Disorder of bone, unspecified: Secondary | ICD-10-CM

## 2018-03-28 DIAGNOSIS — M4807 Spinal stenosis, lumbosacral region: Secondary | ICD-10-CM | POA: Diagnosis not present

## 2018-03-28 DIAGNOSIS — R9389 Abnormal findings on diagnostic imaging of other specified body structures: Secondary | ICD-10-CM | POA: Diagnosis not present

## 2018-03-28 MED ORDER — GADOBUTROL 1 MMOL/ML IV SOLN
10.0000 mL | Freq: Once | INTRAVENOUS | Status: AC | PRN
  Administered 2018-03-28: 10 mL via INTRAVENOUS

## 2018-03-28 NOTE — Progress Notes (Signed)
The results of the MRI of the thoracic and lumbar spine were reviewed and discussed with the reviewing radiologist.  After discussion and reviewing his other imaging studies as well as his clinical history, these findings are more likely suggestive of sarcoidosis rather than malignancy.  These results were discussed with Mike Cruz over the phone today.  No further oncology follow-up is needed based on these findings.  He is to contact me in the future if there is any problems arise.

## 2018-09-13 ENCOUNTER — Other Ambulatory Visit: Payer: Self-pay

## 2018-09-13 DIAGNOSIS — Z79899 Other long term (current) drug therapy: Secondary | ICD-10-CM

## 2018-09-13 LAB — COMPLETE METABOLIC PANEL WITH GFR
AG RATIO: 1.2 (calc) (ref 1.0–2.5)
ALBUMIN MSPROF: 4.1 g/dL (ref 3.6–5.1)
ALKALINE PHOSPHATASE (APISO): 58 U/L (ref 35–144)
ALT: 29 U/L (ref 9–46)
AST: 44 U/L — ABNORMAL HIGH (ref 10–35)
BILIRUBIN TOTAL: 0.5 mg/dL (ref 0.2–1.2)
BUN / CREAT RATIO: 10 (calc) (ref 6–22)
BUN: 22 mg/dL (ref 7–25)
CO2: 29 mmol/L (ref 20–32)
Calcium: 10.1 mg/dL (ref 8.6–10.3)
Chloride: 103 mmol/L (ref 98–110)
Creat: 2.12 mg/dL — ABNORMAL HIGH (ref 0.70–1.33)
GFR, Est African American: 40 mL/min/{1.73_m2} — ABNORMAL LOW (ref >=60)
GFR, Est Non African American: 34 mL/min/{1.73_m2} — ABNORMAL LOW (ref >=60)
GLOBULIN: 3.3 g/dL (ref 1.9–3.7)
GLUCOSE: 89 mg/dL (ref 65–99)
Potassium: 4 mmol/L (ref 3.5–5.3)
SODIUM: 138 mmol/L (ref 135–146)
Total Protein: 7.4 g/dL (ref 6.1–8.1)

## 2018-09-13 LAB — CBC WITH DIFFERENTIAL/PLATELET
Absolute Monocytes: 509 cells/uL (ref 200–950)
Basophils Absolute: 32 cells/uL (ref 0–200)
Basophils Relative: 0.6 %
EOS PCT: 2.6 %
Eosinophils Absolute: 138 cells/uL (ref 15–500)
HCT: 37.7 % — ABNORMAL LOW (ref 38.5–50.0)
Hemoglobin: 12.2 g/dL — ABNORMAL LOW (ref 13.2–17.1)
Lymphs Abs: 1664 cells/uL (ref 850–3900)
MCH: 28.6 pg (ref 27.0–33.0)
MCHC: 32.4 g/dL (ref 32.0–36.0)
MCV: 88.3 fL (ref 80.0–100.0)
MPV: 11.6 fL (ref 7.5–12.5)
Monocytes Relative: 9.6 %
NEUTROS PCT: 55.8 %
Neutro Abs: 2957 cells/uL (ref 1500–7800)
PLATELETS: 152 10*3/uL (ref 140–400)
RBC: 4.27 10*6/uL (ref 4.20–5.80)
RDW: 13 % (ref 11.0–15.0)
TOTAL LYMPHOCYTE: 31.4 %
WBC: 5.3 10*3/uL (ref 3.8–10.8)

## 2018-09-14 NOTE — Progress Notes (Signed)
Creatinine continues to trend up.  GFR is 40.  AST elevated-44.  He is taking MTX 2 tablets po once weekly. Please advise him to discontinue MTX per Dr. Estanislado Pandy.  Please schedule a follow up appointment for the patient.   CBC stable.

## 2018-09-15 NOTE — Progress Notes (Signed)
Office Visit Note  Patient: Mike Cruz             Date of Birth: 03-05-1966           MRN: [id number]             PCP: Josetta Huddle, MD Referring: Josetta Huddle, MD Visit Date: 09/27/2018 Occupation: '@GUAROCC'$ @  Subjective:  Discuss lab work    History of Present Illness: Jaron Czarnecki is a 53 y.o. male with history sarcoidosis.  He denies any recent flares or recurrences.  He denies any shortness of breath coughing, or new pulmonary symptoms.  He continues to follow-up with Dr. Chase Caller every 6 months.  His next appointment Dr. Chase Caller is on 10/19/18.  He was previously taking methotrexate 5 tablets once weekly but reduced to 1 tablet by mouth once weekly per recommendations of Dr. Chase Caller in November 2019.  He was advised to discontinue methotrexate due to elevated creatinine and elevated AST.  His last dose of methotrexate was on 09/14/2018.  He has not developed any new or worsening symptoms.  He has no joint pain or joint swelling at this time.  He has no morning stiffness.  He has no evidence of erythema nodosum. He sees Dr. Madelon Lips who is his nephrologist.  He sees her on a yearly basis.    Activities of Daily Living:  Patient reports morning stiffness for 0 minute.   Patient Denies nocturnal pain.  Difficulty dressing/grooming: Denies Difficulty climbing stairs: Denies Difficulty getting out of chair: Denies Difficulty using hands for taps, buttons, cutlery, and/or writing: Denies  Review of Systems  Constitutional: Negative for fatigue.  HENT: Negative for mouth dryness.   Eyes: Negative for dryness.  Respiratory: Negative for shortness of breath.   Cardiovascular: Negative for swelling in legs/feet.  Gastrointestinal: Negative for constipation and diarrhea.  Endocrine: Negative for excessive thirst.  Genitourinary: Negative for difficulty urinating.  Musculoskeletal: Negative for arthralgias, gait problem, joint pain, joint swelling, muscle weakness,  morning stiffness and muscle tenderness.  Skin: Negative for rash.  Allergic/Immunologic: Negative for susceptible to infections.  Neurological: Negative for weakness.  Hematological: Negative for bruising/bleeding tendency.  Psychiatric/Behavioral: Negative for sleep disturbance.    PMFS History:  Patient Active Problem List   Diagnosis Date Noted  . Genetic testing 12/07/2017  . Family history of kidney cancer   . Family history of ovarian cancer   . Family history of prostate cancer   . Family history of stomach cancer   . Family history of colon cancer   . Renal cell carcinoma ([place]) 11/03/2017  . Renal mass 04/20/2017  . History of hypertension 12/31/2016  . History of anemia 12/31/2016  . High risk medication use 12/31/2016  . History of renal calculi 12/31/2016  . History of prostatitis 12/31/2016  . On prednisone therapy 12/31/2016  . Abnormal serum angiotensin-converting enzyme level 12/31/2016  . Acute kidney injury superimposed on chronic kidney disease ([place]) 11/22/2016  . History of viral pneumonia 11/22/2016  . Anemia 09/27/2016  . Sarcoidosis   . Hypertension     Past Medical History:  Diagnosis Date  . Acute renal failure (ARF) ([place])   . Acute respiratory failure ([place])   . Anemia   . Family history of colon cancer   . Family history of kidney cancer   . Family history of ovarian cancer   . Family history of prostate cancer   . Family history of stomach cancer   . History of kidney stones   .  Hypertension   . Hyponatremia   . Kidney stone    kidney mass 04/13/17  . Pneumonia   . Sarcoidosis     Family History  Problem Relation Age of Onset  . Ovarian cancer Mother 62  . AAA (abdominal aortic aneurysm) Father   . Prostate cancer Father 30  . Kidney cancer Father 39       was 2 separate primaries, not a met  . Stroke Sister 54  . Healthy Daughter   . Healthy Daughter   . Healthy Son   . Lung cancer Maternal Aunt   . Stomach cancer Maternal  Grandmother 56  . Colon cancer Maternal Grandmother 41  . Prostate cancer Maternal Grandfather 65       metastatic  . Lymphoma Cousin   . Prostate cancer Cousin 57   Past Surgical History:  Procedure Laterality Date  . HEAD & NECK SKIN LESION EXCISIONAL BIOPSY    . KNEE SURGERY Left    Arthroscopic  . partial nephrectomt     Left 04/20/17 Dr. Tresa Moore  . ROBOTIC ASSITED PARTIAL NEPHRECTOMY Left 04/20/2017   Procedure: XI ROBOTIC ASSITED PARTIAL NEPHRECTOMY;  Surgeon: Alexis Frock, MD;  Location: [facility];  Service: Urology;  Laterality: Left;   Social History   Social History Narrative  . Not on file   Immunization History  Administered Date(s) Administered  . Influenza,inj,Quad PF,6+ Mos 04/18/2016, 04/21/2017, 03/22/2018     Objective: Vital Signs: BP 125/84 (BP Location: Right Arm, Patient Position: Sitting, Cuff Size: Large)   Pulse 77   Ht '5\' 9"'$  (1.753 m)   Wt 252 lb (114.3 kg)   BMI 37.21 kg/m    Physical Exam Vitals signs and nursing note reviewed.  Constitutional:      Appearance: He is well-developed.  HENT:     Head: Normocephalic and atraumatic.  Eyes:     Conjunctiva/sclera: Conjunctivae normal.     Pupils: Pupils are equal, round, and reactive to light.  Neck:     Musculoskeletal: Normal range of motion and neck supple.  Cardiovascular:     Rate and Rhythm: Normal rate and regular rhythm.     Heart sounds: Normal heart sounds.  Pulmonary:     Effort: Pulmonary effort is normal.     Breath sounds: Normal breath sounds.  Abdominal:     General: Bowel sounds are normal.     Palpations: Abdomen is soft.  Lymphadenopathy:     Cervical: No cervical adenopathy.  Skin:    General: Skin is warm and dry.     Capillary Refill: Capillary refill takes less than 2 seconds.  Neurological:     Mental Status: He is alert and oriented to person, place, and time.  Psychiatric:        Behavior: Behavior normal.      Musculoskeletal Exam: C-spine, thoracic spine  and lumbar spine good range of motion.  No midline spinal tenderness.  No SI joint tenderness.  Shoulder joints, elbows, wrist joints, MCPs and PIPs, DIPs good range of motion with no synovitis.  He has complete fist remission bilaterally.  Hip joints, knee joints, ankle joints, MCPs and PIPs and DIPs good range of motion no synovitis.  No warmth or effusion bilateral knee joints.  Has bilateral knee crepitus.  No tenderness or swelling of ankle joints.  He has synovial thickening of bilateral first MTP joints.  He has no tenderness of MTP joints on exam.  CDAI Exam: CDAI Score: Not documented Patient Global Assessment:  Not documented; Provider Global Assessment: Not documented Swollen: Not documented; Tender: Not documented Joint Exam   Not documented   There is currently no information documented on the homunculus. Go to the Rheumatology activity and complete the homunculus joint exam.  Investigation: No additional findings.  Imaging: No results found.  Recent Labs: Lab Results  Component Value Date   WBC 5.3 09/13/2018   HGB 12.2 (L) 09/13/2018   PLT 152 09/13/2018   NA 138 09/13/2018   K 4.0 09/13/2018   CL 103 09/13/2018   CO2 29 09/13/2018   GLUCOSE 89 09/13/2018   BUN 22 09/13/2018   CREATININE 2.12 (H) 09/13/2018   BILITOT 0.5 09/13/2018   ALKPHOS 64 02/25/2017   AST 44 (H) 09/13/2018   ALT 29 09/13/2018   PROT 7.4 09/13/2018   ALBUMIN 4.3 02/25/2017   CALCIUM 10.1 09/13/2018   GFRAA 40 (L) 09/13/2018    Speciality Comments: No specialty comments available.  Procedures:  No procedures performed Allergies: Penicillins   Assessment / Plan:     Visit Diagnoses: Sarcoidosis: He has not had any recent flares or recurrences.  He has no new or worsening pulmonary symptoms.  He has no shortness of breath at this time.  He was clinically doing well on methotrexate 1 tablet by mouth once weekly per recommendations of Dr. Chase Caller.  We advised him to discontinue  methotrexate after lab work obtained on 09/13/2018 due to elevated creatinine and elevated AST.   His last dose of methotrexate was on 09/14/2018.  He has not noticed any new or worsening symptoms since discontinuing methotrexate.  He has no joint pain or joint swelling at this time.  He has no synovitis.  No evidence of erythema nodosum was noted.  According to Dr. Golden Pop note from 03/22/2018 the patient has been stable.  Her neck is next appointment with Dr. Chase Caller is on 10/19/2018.  He continues to have PFTs and chest CT scans as recommended.  He was advised to notify us if he develops any new or worsening symptoms.  He will follow-up in our office in 6 months.  High risk medication use - d/c MTX due to elevated creatinine and AST.  CBC and CMP will be drawn today.  Will forward lab work to Dr. Hollie Salk.  He was advised to make an appointment with Dr. Hollie Salk for further evaluation.- Plan: COMPLETE METABOLIC PANEL WITH GFR, CBC with Differential/Platelet  Other medical conditions are listed as follows:  Renal mass  History of renal calculi  History of anemia  History of hypertension  History of prostatitis  Muscle fatigue  Long term current use of systemic steroids  Other fatigue  Chronic kidney disease (CKD) stage G3a  Essential hypertension   Orders: Orders Placed This Encounter  Procedures  . COMPLETE METABOLIC PANEL WITH GFR  . CBC with Differential/Platelet   No orders of the defined types were placed in this encounter.    Follow-Up Instructions: Return in about 6 months (around 03/30/2019) for Sarcoidosis.   Ofilia Neas, PA-C   I examined and evaluated the patient with Hazel Sams PA.  Patient continues to do well.  He had no synovitis on examination.  He had no shortness of breath.  He has been off methotrexate due to elevation in his creatinine.  We will check his labs today.  He will follow-up with his nephrologist.  The plan of care was discussed as noted  above.  Bo Merino, MD  Note - This record has been  created using [employer].  Chart creation errors have been sought, but may not always  have been located. Such creation errors do not reflect on  the standard of medical care.

## 2018-09-16 ENCOUNTER — Other Ambulatory Visit: Payer: Self-pay | Admitting: Physician Assistant

## 2018-09-22 ENCOUNTER — Ambulatory Visit: Payer: Self-pay | Admitting: Rheumatology

## 2018-09-27 ENCOUNTER — Ambulatory Visit: Payer: BC Managed Care – PPO | Admitting: Rheumatology

## 2018-09-27 ENCOUNTER — Encounter: Payer: Self-pay | Admitting: Rheumatology

## 2018-09-27 ENCOUNTER — Other Ambulatory Visit: Payer: Self-pay

## 2018-09-27 VITALS — BP 125/84 | HR 77 | Ht 69.0 in | Wt 252.0 lb

## 2018-09-27 DIAGNOSIS — Z862 Personal history of diseases of the blood and blood-forming organs and certain disorders involving the immune mechanism: Secondary | ICD-10-CM

## 2018-09-27 DIAGNOSIS — Z7952 Long term (current) use of systemic steroids: Secondary | ICD-10-CM

## 2018-09-27 DIAGNOSIS — D869 Sarcoidosis, unspecified: Secondary | ICD-10-CM | POA: Diagnosis not present

## 2018-09-27 DIAGNOSIS — N2889 Other specified disorders of kidney and ureter: Secondary | ICD-10-CM | POA: Diagnosis not present

## 2018-09-27 DIAGNOSIS — Z79899 Other long term (current) drug therapy: Secondary | ICD-10-CM | POA: Diagnosis not present

## 2018-09-27 DIAGNOSIS — Z8679 Personal history of other diseases of the circulatory system: Secondary | ICD-10-CM

## 2018-09-27 DIAGNOSIS — R5383 Other fatigue: Secondary | ICD-10-CM

## 2018-09-27 DIAGNOSIS — M6289 Other specified disorders of muscle: Secondary | ICD-10-CM

## 2018-09-27 DIAGNOSIS — N1831 Chronic kidney disease, stage 3a: Secondary | ICD-10-CM

## 2018-09-27 DIAGNOSIS — Z87442 Personal history of urinary calculi: Secondary | ICD-10-CM

## 2018-09-27 DIAGNOSIS — I10 Essential (primary) hypertension: Secondary | ICD-10-CM

## 2018-09-27 DIAGNOSIS — N183 Chronic kidney disease, stage 3 (moderate): Secondary | ICD-10-CM

## 2018-09-27 DIAGNOSIS — Z87438 Personal history of other diseases of male genital organs: Secondary | ICD-10-CM

## 2018-09-28 LAB — CBC WITH DIFFERENTIAL/PLATELET
Absolute Monocytes: 667 cells/uL (ref 200–950)
BASOS ABS: 33 {cells}/uL (ref 0–200)
Basophils Relative: 0.5 %
EOS ABS: 152 {cells}/uL (ref 15–500)
EOS PCT: 2.3 %
HCT: 37.8 % — ABNORMAL LOW (ref 38.5–50.0)
Hemoglobin: 12.3 g/dL — ABNORMAL LOW (ref 13.2–17.1)
Lymphs Abs: 2066 cells/uL (ref 850–3900)
MCH: 28.5 pg (ref 27.0–33.0)
MCHC: 32.5 g/dL (ref 32.0–36.0)
MCV: 87.5 fL (ref 80.0–100.0)
MPV: 11.6 fL (ref 7.5–12.5)
Monocytes Relative: 10.1 %
NEUTROS ABS: 3683 {cells}/uL (ref 1500–7800)
NEUTROS PCT: 55.8 %
PLATELETS: 165 10*3/uL (ref 140–400)
RBC: 4.32 10*6/uL (ref 4.20–5.80)
RDW: 12.7 % (ref 11.0–15.0)
Total Lymphocyte: 31.3 %
WBC: 6.6 10*3/uL (ref 3.8–10.8)

## 2018-09-28 LAB — COMPLETE METABOLIC PANEL WITH GFR
AG RATIO: 1.5 (calc) (ref 1.0–2.5)
ALBUMIN MSPROF: 4.5 g/dL (ref 3.6–5.1)
ALT: 36 U/L (ref 9–46)
AST: 36 U/L — ABNORMAL HIGH (ref 10–35)
Alkaline phosphatase (APISO): 63 U/L (ref 35–144)
BUN / CREAT RATIO: 10 (calc) (ref 6–22)
BUN: 21 mg/dL (ref 7–25)
CALCIUM: 10.3 mg/dL (ref 8.6–10.3)
CO2: 29 mmol/L (ref 20–32)
CREATININE: 2.05 mg/dL — AB (ref 0.70–1.33)
Chloride: 103 mmol/L (ref 98–110)
GFR, EST NON AFRICAN AMERICAN: 36 mL/min/{1.73_m2} — AB (ref >=60)
GFR, Est African American: 41 mL/min/{1.73_m2} — ABNORMAL LOW (ref >=60)
GLOBULIN: 3.1 g/dL (ref 1.9–3.7)
Glucose, Bld: 91 mg/dL (ref 65–99)
POTASSIUM: 3.9 mmol/L (ref 3.5–5.3)
Sodium: 140 mmol/L (ref 135–146)
Total Bilirubin: 0.4 mg/dL (ref 0.2–1.2)
Total Protein: 7.6 g/dL (ref 6.1–8.1)

## 2018-09-28 NOTE — Progress Notes (Signed)
Creatinine remains elevated and GFR is low-41.  AST is elevated but trending down.  CBC is stable.  Please notify patient and forward labs to nephrologist.

## 2018-10-19 ENCOUNTER — Ambulatory Visit: Payer: BC Managed Care – PPO | Admitting: Internal Medicine

## 2018-10-27 ENCOUNTER — Other Ambulatory Visit: Payer: Self-pay | Admitting: Physician Assistant

## 2018-10-30 ENCOUNTER — Ambulatory Visit (HOSPITAL_COMMUNITY)
Admission: RE | Admit: 2018-10-30 | Discharge: 2018-10-30 | Disposition: A | Discharge status: Home / Self Care | Payer: BC Managed Care – PPO | Source: Ambulatory Visit | Attending: Urology | Admitting: Urology

## 2018-10-30 ENCOUNTER — Other Ambulatory Visit (HOSPITAL_COMMUNITY): Payer: Self-pay | Admitting: Urology

## 2018-10-30 ENCOUNTER — Other Ambulatory Visit: Payer: Self-pay

## 2018-10-30 DIAGNOSIS — C642 Malignant neoplasm of left kidney, except renal pelvis: Secondary | ICD-10-CM | POA: Insufficient documentation

## 2019-03-02 IMAGING — MR MR ABDOMEN WO/W CM
11 of 18 series · 27 of 48 positions shown · IV contrast (10 ML MULTIHANCE)
Comparison: CT scan 10/24/2017 and MRI 01/04/2017

CLINICAL DATA: Status post partial left nephrectomy for renal cell
cancer 1 year ago. Pancreatic cysts.

EXAM:
MRI ABDOMEN WITHOUT AND WITH CONTRAST
TECHNIQUE: Multiplanar multisequence MR imaging of the abdomen was performed
both before and after the administration of intravenous contrast.
CONTRAST:  10mL MULTIHANCE GADOBENATE DIMEGLUMINE 529 MG/ML IV SOLN

[Series 3: T2 · coronal · 5.0mm · 1.56mm/px · 3 of 42 slices shown (1 of 3)]
[im 1/42]
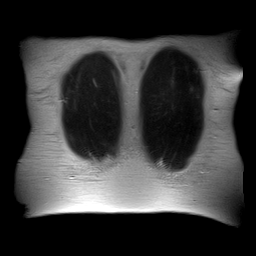
[im 21/42]
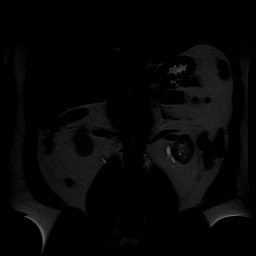
[im 42/42]
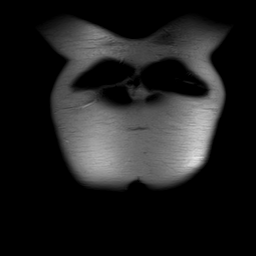

[Series 4: T2 · axial · 5.0mm · 1.41mm/px · z∈[-139,+105]mm · 2 of 40 slices shown (2 of 3)]
[im 1/40]
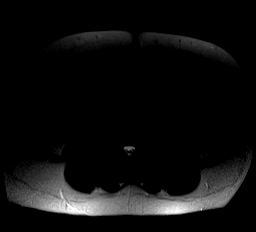
[im 40/40]
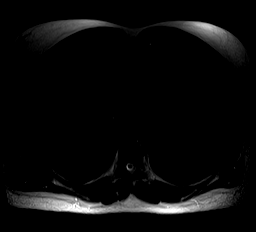

[Series 5: axial in out · axial · 6.0mm · 0.74mm/px · z∈[-149,+97]mm · 3 of 72 slices shown]
[im 1/72]
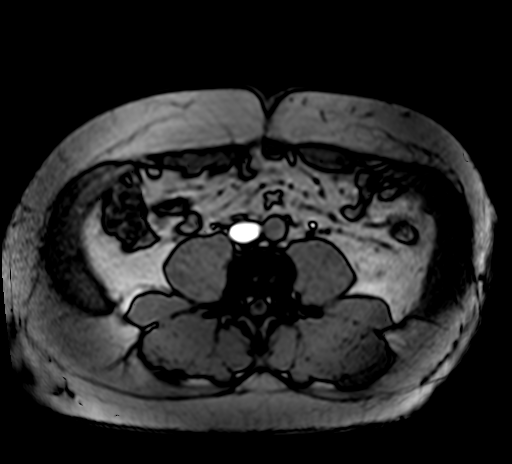
[im 36/72]
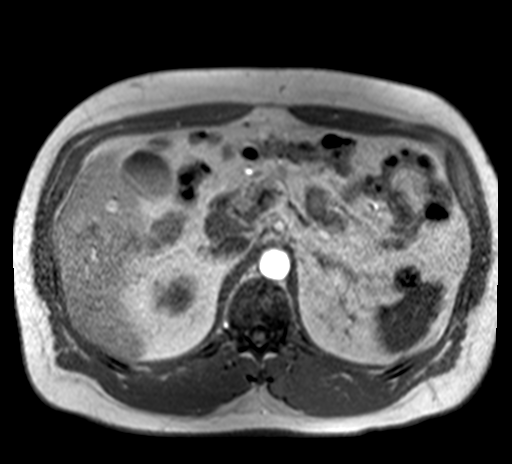
[im 72/72]
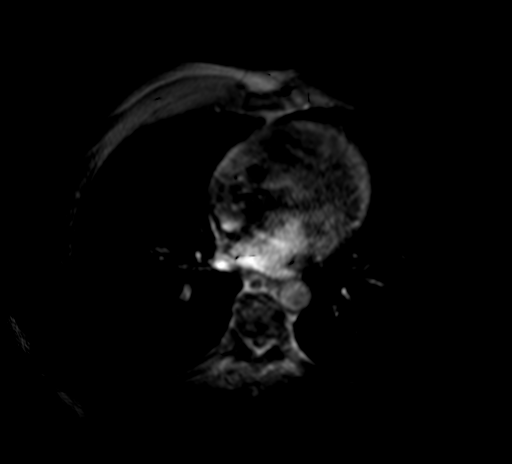

[Series 6: MRCP fat-sat · coronal · 3.0mm · 0.70mm/px · 2 of 66 slices shown]
[im 1/66]
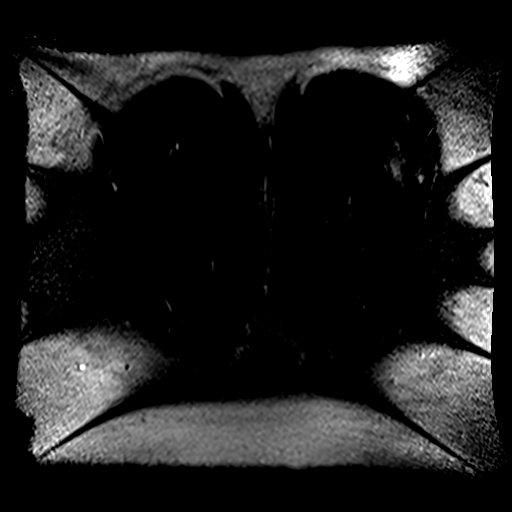
[im 66/66]
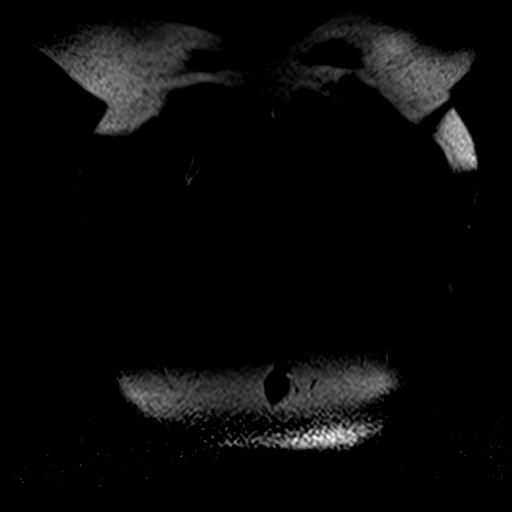

[Series 7: T2 · axial · 6.0mm · 0.74mm/px · 1 of 40 slices shown (3 of 3)]
[im 1/40]
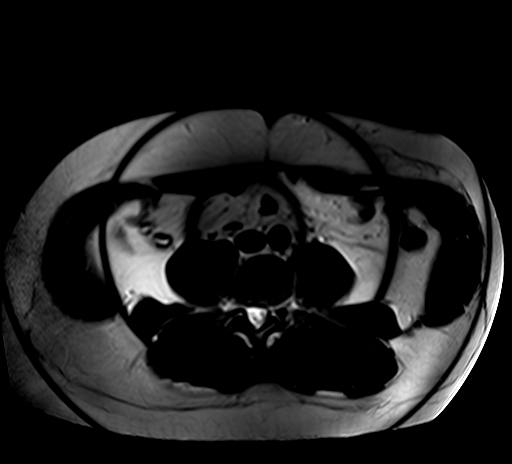

[Series 10: ep2d_diff_b50_500_800_p2_trig · axial · 6.0mm · 1.98mm/px · z∈[-160,+114]mm · 4 of 120 slices shown]
[im 1/120]
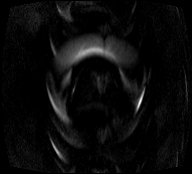
[im 40/120]
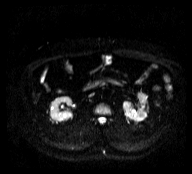
[im 80/120]
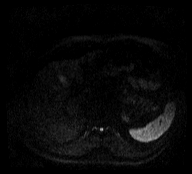
[im 120/120]
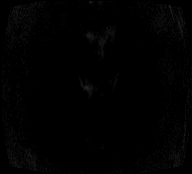

[Series 11: ep2d_diff_b50_500_800_p2_trig_adc · axial · 6.0mm · 1.98mm/px · 1 of 40 slices shown]
[im 1/40]
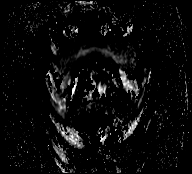

[Series 12: T1 dynamic · axial · non-contrast · 3.0mm · 0.78mm/px · z∈[-153,+108]mm · 3 of 88 slices shown]
[im 1/88]
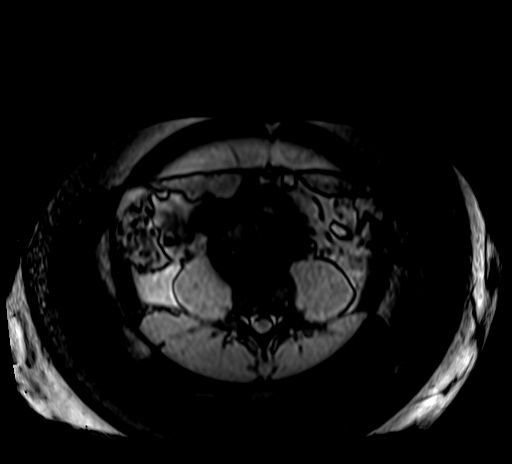
[im 44/88]
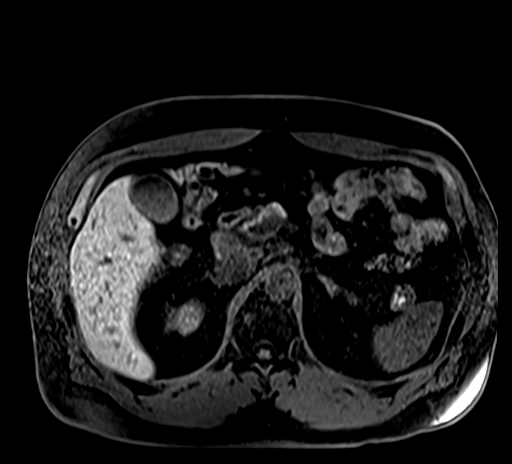
[im 88/88]
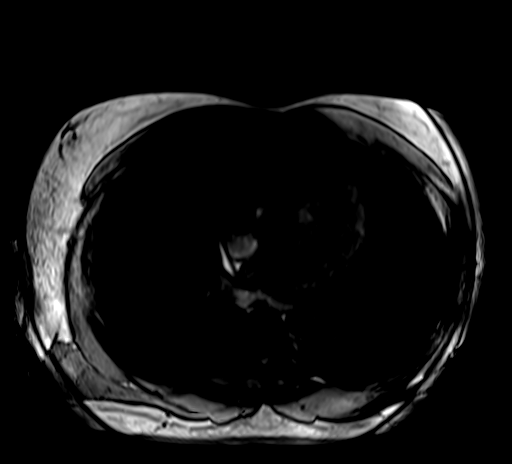

[Series 13: post 25 sec · axial · 3.0mm · 0.78mm/px · z∈[-153,+108]mm · 3 of 88 slices shown]
[im 1/88]
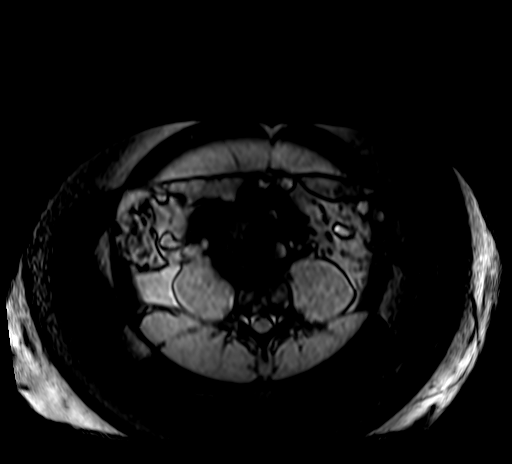
[im 44/88]
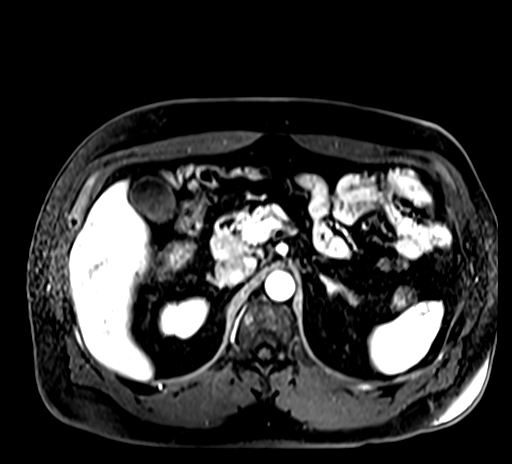
[im 88/88]
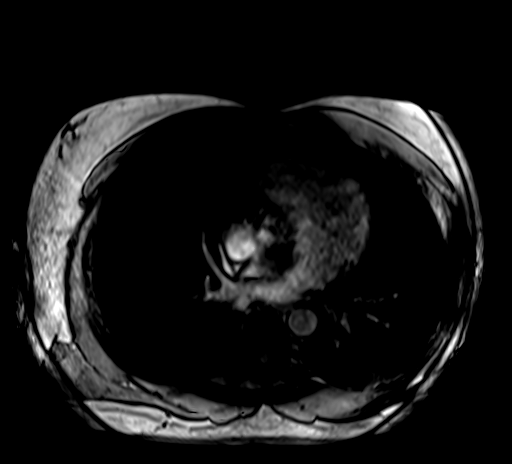

[Series 14: post 25 sec_sub · axial · 3.0mm · 0.78mm/px · z∈[-153,+108]mm · 3 of 88 slices shown]
[im 1/88]
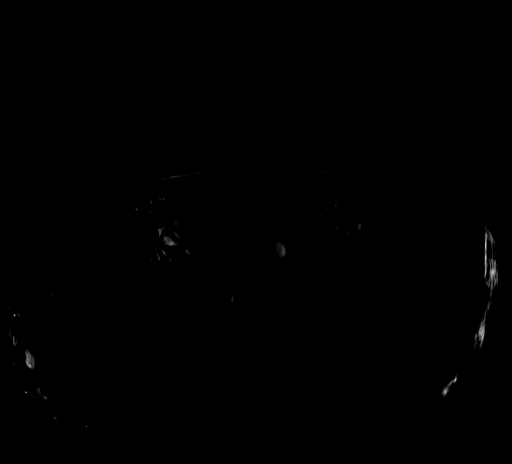
[im 44/88]
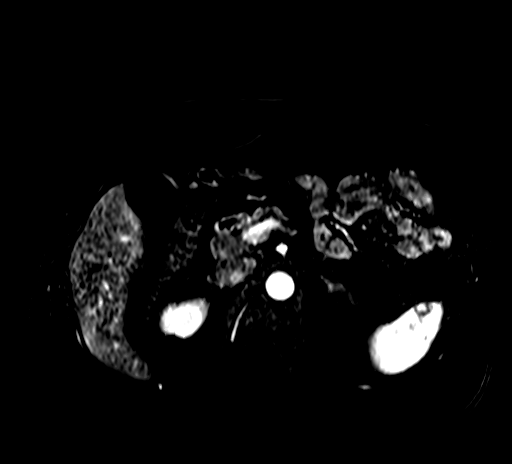
[im 88/88]
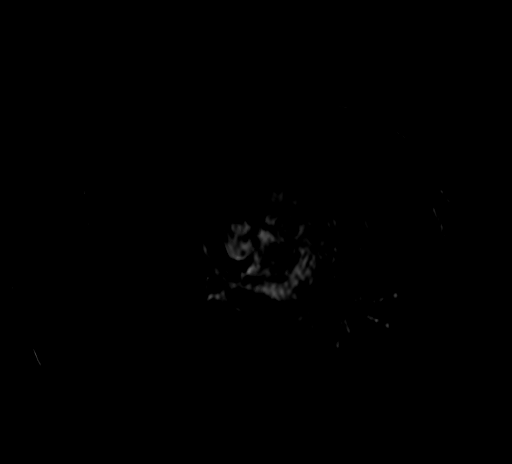

[Series 15: post 45 sec · axial · 3.0mm · 0.78mm/px · z∈[-153,-24]mm · 2 of 88 slices shown]
[im 1/88]
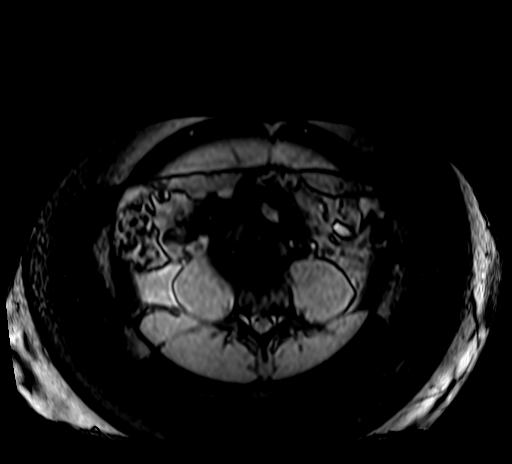
[im 44/88]
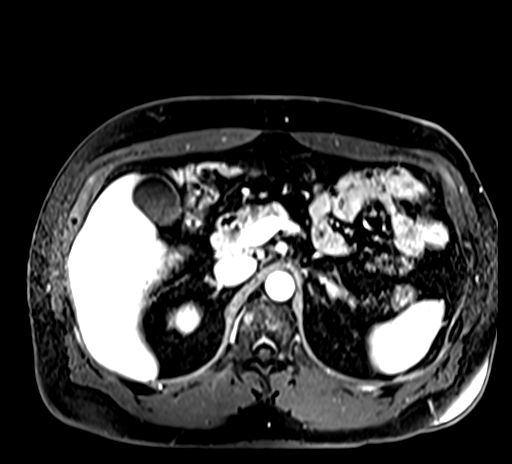

[27 of 48 positions shown; findings below may reference images not displayed]

FINDINGS: Lower chest: The lung bases are clear. No worrisome pulmonary
lesions. No pleural or pericardial effusion.

Hepatobiliary: No focal hepatic lesions or intrahepatic biliary
dilatation. The gallbladder is normal. No common bile duct
dilatation.

Pancreas: Stable small scattered pancreatic cystic lesions. The 10
mm cyst in the uncinate process is unchanged. The 13 mm cyst in the
body tail junction region is stable. Several other smaller
pancreatic cysts are unchanged. No worrisome MR imaging features
such as contrast enhancement. These are most likely
postinflammatory.

Spleen:  Normal size.  No focal lesions.

Adrenals/Urinary Tract: The adrenal glands are unremarkable and
stable.

The right kidney demonstrates small simple appearing cysts along
with a small stable hemorrhagic cyst involving the interpolar region
posteriorly. This measures 10 mm.

Stable surgical changes involving the left kidney from a prior
partial nephrectomy. No findings suspicious for residual or
recurrent tumor.

Stomach/Bowel: Visualized portions within the abdomen are
unremarkable.

Vascular/Lymphatic: Prominent celiac axis node measures 2 cm on
image 17. Is also a 19 mm node just anterior to the IVC. These
appear relatively stable. No retroperitoneal adenopathy. The major
vascular structures are patent.

Other:  No ascites or abdominal wall hernia.

Musculoskeletal: There is a stable T12 hemangioma. There appear to
be several other small enhancing bone lesions which I do not see for
certain on the prior study and are somewhat worrisome.
IMPRESSION: 1. Status post partial left nephrectomy with no findings suspicious
for residual or recurrent tumor.
2. Stable celiac axis and hepatoduodenal ligament lymph nodes. No
retroperitoneal adenopathy.
3. Stable benign-appearing T12 hemangioma. There appear to be new
small enhancing vertebral body lesions. Could not exclude metastatic
disease. Whole-body bone scan or PET-CT may be helpful for further
evaluation.
4. Several small stable cystic pancreatic lesions, most likely
postinflammatory.

## 2019-03-22 NOTE — Progress Notes (Signed)
Office Visit Note  Patient: Mike Cruz             Date of Birth: 03/27/1966           MRN: [id number]             PCP: Josetta Huddle, MD Referring: Josetta Huddle, MD Visit Date: 04/05/2019 Occupation: @GUAROCC @  Subjective:  History of sarcoidosis.   History of Present Illness: Mike Cruz is a 53 y.o. male with history of sarcoidosis.  He has been off methotrexate since March 2020.  He denies any joint pain or joint swelling.  There is no history of shortness of breath.  Activities of Daily Living:  Patient reports morning stiffness for 0 minutes.   Patient Denies nocturnal pain.  Difficulty dressing/grooming: Denies Difficulty climbing stairs: Denies Difficulty getting out of chair: Denies Difficulty using hands for taps, buttons, cutlery, and/or writing: Denies  Review of Systems  Constitutional: Negative for fatigue.  HENT: Negative for mouth sores, mouth dryness and nose dryness.   Eyes: Negative for itching and dryness.  Respiratory: Negative for shortness of breath, wheezing and difficulty breathing.   Cardiovascular: Negative for chest pain, palpitations and swelling in legs/feet.  Gastrointestinal: Negative for abdominal pain, blood in stool, constipation and diarrhea.  Endocrine: Negative for increased urination.  Genitourinary: Negative for difficulty urinating and painful urination.  Musculoskeletal: Negative for arthralgias, joint pain, joint swelling and morning stiffness.  Skin: Negative for rash.  Allergic/Immunologic: Negative for susceptible to infections.  Neurological: Negative for dizziness, numbness, headaches, memory loss and weakness.  Hematological: Negative for swollen glands.  Psychiatric/Behavioral: Negative for depressed mood, confusion and sleep disturbance. The patient is not nervous/anxious.     PMFS History:  Patient Active Problem List   Diagnosis Date Noted  . Genetic testing 12/07/2017  . Family history of kidney cancer   .  Family history of ovarian cancer   . Family history of prostate cancer   . Family history of stomach cancer   . Family history of colon cancer   . Renal cell carcinoma ([place]) 11/03/2017  . Renal mass 04/20/2017  . History of hypertension 12/31/2016  . History of anemia 12/31/2016  . High risk medication use 12/31/2016  . History of renal calculi 12/31/2016  . History of prostatitis 12/31/2016  . On prednisone therapy 12/31/2016  . Abnormal serum angiotensin-converting enzyme level 12/31/2016  . Acute kidney injury superimposed on chronic kidney disease ([facility]) 11/22/2016  . History of viral pneumonia 11/22/2016  . Anemia 09/27/2016  . Sarcoidosis   . Hypertension     Past Medical History:  Diagnosis Date  . Acute renal failure (ARF) ([facility])   . Acute respiratory failure ([facility])   . Anemia   . Family history of colon cancer   . Family history of kidney cancer   . Family history of ovarian cancer   . Family history of prostate cancer   . Family history of stomach cancer   . History of kidney stones   . Hypertension   . Hyponatremia   . Kidney stone    kidney mass 04/13/17  . Pneumonia   . Sarcoidosis     Family History  Problem Relation Age of Onset  . Ovarian cancer Mother 87  . AAA (abdominal aortic aneurysm) Father   . Prostate cancer Father 77  . Kidney cancer Father 22       was 2 separate primaries, not a met  . Stroke Sister 41  . Healthy Daughter   .  Healthy Daughter   . Healthy Son   . Lung cancer Maternal Aunt   . Stomach cancer Maternal Grandmother 33  . Colon cancer Maternal Grandmother 46  . Prostate cancer Maternal Grandfather 65       metastatic  . Lymphoma Cousin   . Prostate cancer Cousin 75   Past Surgical History:  Procedure Laterality Date  . HEAD & NECK SKIN LESION EXCISIONAL BIOPSY    . KNEE SURGERY Left    Arthroscopic  . partial nephrectomt     Left 04/20/17 Dr. Tresa Moore  . ROBOTIC ASSITED PARTIAL NEPHRECTOMY Left 04/20/2017   Procedure: XI  ROBOTIC ASSITED PARTIAL NEPHRECTOMY;  Surgeon: Alexis Frock, MD;  Location: [facility];  Service: Urology;  Laterality: Left;   Social History   Social History Narrative  . Not on file   Immunization History  Administered Date(s) Administered  . Influenza,inj,Quad PF,6+ Mos 04/18/2016, 04/21/2017, 03/22/2018     Objective: Vital Signs: BP 133/84 (BP Location: Left Arm, Patient Position: Sitting, Cuff Size: Large)   Pulse 60   Resp 14   Ht 5' 9"  (1.753 m)   Wt 253 lb (114.8 kg)   BMI 37.36 kg/m    Physical Exam Vitals signs and nursing note reviewed.  Constitutional:      Appearance: He is well-developed.  HENT:     Head: Normocephalic and atraumatic.  Eyes:     Conjunctiva/sclera: Conjunctivae normal.     Pupils: Pupils are equal, round, and reactive to light.  Neck:     Musculoskeletal: Normal range of motion and neck supple.  Cardiovascular:     Rate and Rhythm: Normal rate and regular rhythm.     Heart sounds: Normal heart sounds.  Pulmonary:     Effort: Pulmonary effort is normal.     Breath sounds: Normal breath sounds.  Abdominal:     General: Bowel sounds are normal.     Palpations: Abdomen is soft.  Skin:    General: Skin is warm and dry.     Capillary Refill: Capillary refill takes less than 2 seconds.  Neurological:     Mental Status: He is alert and oriented to person, place, and time.  Psychiatric:        Behavior: Behavior normal.      Musculoskeletal Exam: C-spine, shoulder joints, elbow joints, wrist joints, MCPs and PIPs with good range of motion with no synovitis.  Hip joints, knee joints, ankles, MTPs and PIPs with good range of motion with no synovitis.  CDAI Exam: CDAI Score: - Patient Global: -; Provider Global: - Swollen: -; Tender: - Joint Exam   No joint exam has been documented for this visit   There is currently no information documented on the homunculus. Go to the Rheumatology activity and complete the homunculus joint exam.   Investigation: No additional findings.  Imaging: No results found.  Recent Labs: Lab Results  Component Value Date   WBC 6.6 09/27/2018   HGB 12.3 (L) 09/27/2018   PLT 165 09/27/2018   NA 140 09/27/2018   K 3.9 09/27/2018   CL 103 09/27/2018   CO2 29 09/27/2018   GLUCOSE 91 09/27/2018   BUN 21 09/27/2018   CREATININE 2.05 (H) 09/27/2018   BILITOT 0.4 09/27/2018   ALKPHOS 64 02/25/2017   AST 36 (H) 09/27/2018   ALT 36 09/27/2018   PROT 7.6 09/27/2018   ALBUMIN 4.3 02/25/2017   CALCIUM 10.3 09/27/2018   GFRAA 41 (L) 09/27/2018    Speciality Comments: No  specialty comments available.  Procedures:  No procedures performed Allergies: Penicillins   Assessment / Plan:     Visit Diagnoses: Sarcoidosis-patient has longstanding history of sarcoidosis.  He was treated with methotrexate and prednisone by a previous rheumatologist.  I discontinued his methotrexate in March after I found that his GFR was low.  Patient denies any history of uveitis, arthritis, shortness of breath.  I made him aware of the symptoms of sarcoidosis.  If he develops any new symptoms he is supposed to notify me.  His chest x-ray was normal in April 2020.  High risk medication use - d/c MTX due to elevated creatinine and AST in March 2020.  History of renal calculi  Essential hypertension-his blood pressure is well controlled.  Long term current use of systemic steroids-we discussed possible DEXA scan in the future.  History of prostatitis  History of anemia  Chronic kidney disease (CKD) stage G3a- is followed by nephrology.  According to patient his GFR has been stable.  I have advised him to forward labs to me.  Orders: No orders of the defined types were placed in this encounter.  No orders of the defined types were placed in this encounter.    Follow-Up Instructions: Return in about 6 months (around 10/03/2019) for Sarcoidosis.   Bo Merino, MD  Note - This record has been created  using Editor, commissioning.  Chart creation errors have been sought, but may not always  have been located. Such creation errors do not reflect on  the standard of medical care.

## 2019-04-05 ENCOUNTER — Ambulatory Visit: Payer: BC Managed Care – PPO | Admitting: Rheumatology

## 2019-04-05 ENCOUNTER — Encounter: Payer: Self-pay | Admitting: Rheumatology

## 2019-04-05 ENCOUNTER — Other Ambulatory Visit: Payer: Self-pay

## 2019-04-05 VITALS — BP 133/84 | HR 60 | Resp 14 | Ht 69.0 in | Wt 253.0 lb

## 2019-04-05 DIAGNOSIS — I10 Essential (primary) hypertension: Secondary | ICD-10-CM

## 2019-04-05 DIAGNOSIS — D869 Sarcoidosis, unspecified: Secondary | ICD-10-CM | POA: Diagnosis not present

## 2019-04-05 DIAGNOSIS — Z862 Personal history of diseases of the blood and blood-forming organs and certain disorders involving the immune mechanism: Secondary | ICD-10-CM

## 2019-04-05 DIAGNOSIS — Z87438 Personal history of other diseases of male genital organs: Secondary | ICD-10-CM

## 2019-04-05 DIAGNOSIS — Z79899 Other long term (current) drug therapy: Secondary | ICD-10-CM | POA: Diagnosis not present

## 2019-04-05 DIAGNOSIS — N1831 Chronic kidney disease, stage 3a: Secondary | ICD-10-CM

## 2019-04-05 DIAGNOSIS — Z8679 Personal history of other diseases of the circulatory system: Secondary | ICD-10-CM

## 2019-04-05 DIAGNOSIS — Z87442 Personal history of urinary calculi: Secondary | ICD-10-CM | POA: Diagnosis not present

## 2019-04-05 DIAGNOSIS — N183 Chronic kidney disease, stage 3 (moderate): Secondary | ICD-10-CM

## 2019-04-09 ENCOUNTER — Other Ambulatory Visit: Payer: Self-pay

## 2019-04-09 ENCOUNTER — Ambulatory Visit (INDEPENDENT_AMBULATORY_CARE_PROVIDER_SITE_OTHER)
Admission: RE | Admit: 2019-04-09 | Discharge: 2019-04-09 | Disposition: A | Discharge status: Home / Self Care | Payer: BC Managed Care – PPO | Source: Ambulatory Visit | Attending: Internal Medicine | Admitting: Internal Medicine

## 2019-04-09 DIAGNOSIS — R911 Solitary pulmonary nodule: Secondary | ICD-10-CM | POA: Diagnosis not present

## 2019-04-11 ENCOUNTER — Telehealth: Payer: Self-pay | Admitting: Internal Medicine

## 2019-04-11 DIAGNOSIS — D869 Sarcoidosis, unspecified: Secondary | ICD-10-CM

## 2019-04-11 NOTE — Telephone Encounter (Signed)
Mr Mike Cruz CT shows improvement with mediastinal nodes c/w sarcoid. Last seen 1  Year ago. Pls give appt first avail with spiro and dlco to return and see me  Thanks    SIGNATURE    Dr. Brand Males, M.D., F.C.C.P,  Pulmonary and Critical Care Medicine Staff Physician, [place] Director - Interstitial Lung Disease  Program  Pulmonary [place] [place], [place], [address]  Pager: [phone], If no answer or between  15:00h - 7:00h: call [phone] Telephone: [phone]  10:41 AM 04/11/2019     Ct Chest Wo Contrast  Result Date: 04/09/2019 CLINICAL DATA:  Follow up pulmonary nodule. No history of malignancy. Sarcoidosis. EXAM: CT CHEST WITHOUT CONTRAST TECHNIQUE: Multidetector CT imaging of the chest was performed following the standard protocol without IV contrast. COMPARISON:  CT chest 03/11/2016 and 02/16/2018. FINDINGS: Cardiovascular: No significant vascular findings on noncontrast imaging. The heart size is normal. There is no pericardial effusion. Mediastinum/Nodes: Scattered mildly enlarged mediastinal and hilar lymph nodes are again noted, slightly improved from the most recent study. There is a right paratracheal node measuring 14 mm on image 46/2 and a subcarinal node measuring 13 mm on image 69/2.No progressive adenopathy. The thyroid gland, trachea and esophagus demonstrate no significant findings. Lungs/Pleura: There is no pleural effusion. The previously referenced tiny lung nodules are stable, including a 3 mm right lower lobe nodule (image 63/3) and a left perifissural nodule measuring 4 mm on image 74/3. No new or enlarging nodules. Upper abdomen: The visualized upper abdomen appears stable without significant findings. 19 mm portacaval node on image 147/2 is stable. Musculoskeletal/Chest wall: There is no chest wall mass or suspicious osseous finding. Tiny sclerotic lesion laterally in the  right 6th rib (image 20/6) and ill-defined sclerosis posteriorly in the right 9th rib (image 104/3) are stable. There is stable lucency in the T12 vertebral body consistent with a hemangioma. IMPRESSION: 1. Stable chest CT without acute or suspicious findings. 2. Tiny pulmonary nodules are stable from 2017, consistent with benign findings. 3. Thoracic adenopathy shows continued improvement, likely due to the patient's underlying sarcoidosis. 4. Stable benign osseous findings. Electronically Signed   By: Richardean Sale M.D.   On: 04/09/2019 08:53

## 2019-04-12 NOTE — Telephone Encounter (Signed)
lmtcb for pt.  

## 2019-04-13 NOTE — Telephone Encounter (Signed)
Patient is returning the call. CB is [phone]

## 2019-04-13 NOTE — Telephone Encounter (Signed)
Called and spoke w/ pt regarding MR's results and recommendations below. Pt verbalized understanding with no additional questions or concerns. Full PFT with dlco has been order. Per PFT protocol, I am routing this message to [facility] for scheduling. Patrice, please advise. Thank you.

## 2019-04-16 NOTE — Telephone Encounter (Signed)
Since there are no pft openings until November. I have placed a recall in the patient appt desk to notify patient with MR has openings in November -pr

## 2019-08-30 ENCOUNTER — Ambulatory Visit: Payer: BC Managed Care – PPO | Attending: Family

## 2019-08-30 DIAGNOSIS — Z23 Encounter for immunization: Secondary | ICD-10-CM | POA: Insufficient documentation

## 2019-08-30 NOTE — Progress Notes (Signed)
   Covid-19 Vaccination Clinic  Name:  Mike Cruz    MRN: [id number] DOB: 1966-05-27  08/30/2019  [name] was observed post Covid-19 immunization for 15 minutes without incidence. He was provided with Vaccine Information Sheet and instruction to access the V-Safe system.   [name] was instructed to call 911 with any severe reactions post vaccine: Marland Kitchen Difficulty breathing  . Swelling of your face and throat  . A fast heartbeat  . A bad rash all over your body  . Dizziness and weakness    Immunizations Administered    Name Date Dose VIS Date Route   Moderna COVID-19 Vaccine 08/30/2019  4:30 PM 0.5 mL 06/19/2019 Intramuscular   Manufacturer: Moderna   Lot: [id number]   [id number]

## 2019-09-28 NOTE — Progress Notes (Deleted)
 Office Visit Note  Patient: Mike Cruz             Date of Birth: 12/21/1965           MRN: [id number]             PCP: Gates, Loyce, MD Referring: Gates, Timotheus, MD Visit Date: 10/04/2019 Occupation: @GUAROCC@  Subjective:  No chief complaint on file.   History of Present Illness: Mike Cruz is a 53 y.o. male ***   Activities of Daily Living:  Patient reports morning stiffness for *** {minute/hour:19697}.   Patient {ACTIONS;DENIES/REPORTS:21021675::"Denies"} nocturnal pain.  Difficulty dressing/grooming: {ACTIONS;DENIES/REPORTS:21021675::"Denies"} Difficulty climbing stairs: {ACTIONS;DENIES/REPORTS:21021675::"Denies"} Difficulty getting out of chair: {ACTIONS;DENIES/REPORTS:21021675::"Denies"} Difficulty using hands for taps, buttons, cutlery, and/or writing: {ACTIONS;DENIES/REPORTS:21021675::"Denies"}  No Rheumatology ROS completed.   PMFS History:  Patient Active Problem List   Diagnosis Date Noted  . Genetic testing 12/07/2017  . Family history of kidney cancer   . Family history of ovarian cancer   . Family history of prostate cancer   . Family history of stomach cancer   . Family history of colon cancer   . Renal cell carcinoma ([facility]) 11/03/2017  . Renal mass 04/20/2017  . History of hypertension 12/31/2016  . History of anemia 12/31/2016  . High risk medication use 12/31/2016  . History of renal calculi 12/31/2016  . History of prostatitis 12/31/2016  . On prednisone therapy 12/31/2016  . Abnormal serum angiotensin-converting enzyme level 12/31/2016  . Acute kidney injury superimposed on chronic kidney disease ([facility]) 11/22/2016  . History of viral pneumonia 11/22/2016  . Anemia 09/27/2016  . Sarcoidosis   . Hypertension     Past Medical History:  Diagnosis Date  . Acute renal failure (ARF) ([facility])   . Acute respiratory failure ([facility])   . Anemia   . Family history of colon cancer   . Family history of kidney cancer   . Family history of ovarian  cancer   . Family history of prostate cancer   . Family history of stomach cancer   . History of kidney stones   . Hypertension   . Hyponatremia   . Kidney stone    kidney mass 04/13/17  . Pneumonia   . Sarcoidosis     Family History  Problem Relation Age of Onset  . Ovarian cancer Mother 64  . AAA (abdominal aortic aneurysm) Father   . Prostate cancer Father 55  . Kidney cancer Father 55       was 2 separate primaries, not a met  . Stroke Sister 48  . Healthy Daughter   . Healthy Daughter   . Healthy Son   . Lung cancer Maternal Aunt   . Stomach cancer Maternal Grandmother 75  . Colon cancer Maternal Grandmother 75  . Prostate cancer Maternal Grandfather 65       metastatic  . Lymphoma Cousin   . Prostate cancer Cousin 50   Past Surgical History:  Procedure Laterality Date  . HEAD & NECK SKIN LESION EXCISIONAL BIOPSY    . KNEE SURGERY Left    Arthroscopic  . partial nephrectomt     Left 04/20/17 Dr. Manny  . ROBOTIC ASSITED PARTIAL NEPHRECTOMY Left 04/20/2017   Procedure: XI ROBOTIC ASSITED PARTIAL NEPHRECTOMY;  Surgeon: Manny, Theodore, MD;  Location: [facility];  Service: Urology;  Laterality: Left;   Social History   Social History Narrative  . Not on file   Immunization History  Administered Date(s) Administered  . Influenza,inj,Quad PF,6+ Mos 04/18/2016, 04/21/2017, 03/22/2018  .   Moderna SARS-COVID-2 Vaccination 08/30/2019     Objective: Vital Signs: There were no vitals taken for this visit.   Physical Exam   Musculoskeletal Exam: ***  CDAI Exam: CDAI Score: -- Patient Global: --; Provider Global: -- Swollen: --; Tender: -- Joint Exam 10/04/2019   No joint exam has been documented for this visit   There is currently no information documented on the homunculus. Go to the Rheumatology activity and complete the homunculus joint exam.  Investigation: No additional findings.  Imaging: No results found.  Recent Labs: Lab Results  Component Value  Date   WBC 6.6 09/27/2018   HGB 12.3 (L) 09/27/2018   PLT 165 09/27/2018   NA 140 09/27/2018   K 3.9 09/27/2018   CL 103 09/27/2018   CO2 29 09/27/2018   GLUCOSE 91 09/27/2018   BUN 21 09/27/2018   CREATININE 2.05 (H) 09/27/2018   BILITOT 0.4 09/27/2018   ALKPHOS 64 02/25/2017   AST 36 (H) 09/27/2018   ALT 36 09/27/2018   PROT 7.6 09/27/2018   ALBUMIN 4.3 02/25/2017   CALCIUM 10.3 09/27/2018   GFRAA 41 (L) 09/27/2018    Speciality Comments: No specialty comments available.  Procedures:  No procedures performed Allergies: Penicillins   Assessment / Plan:     Visit Diagnoses: No diagnosis found.  Orders: No orders of the defined types were placed in this encounter.  No orders of the defined types were placed in this encounter.   Face-to-face time spent with patient was *** minutes. Greater than 50% of time was spent in counseling and coordination of care.  Follow-Up Instructions: No follow-ups on file.    M , PA-C  Note - This record has been created using Dragon software.  Chart creation errors have been sought, but may not always  have been located. Such creation errors do not reflect on  the standard of medical care.  

## 2019-10-02 ENCOUNTER — Ambulatory Visit: Payer: BC Managed Care – PPO | Attending: Family

## 2019-10-02 DIAGNOSIS — Z23 Encounter for immunization: Secondary | ICD-10-CM

## 2019-10-02 NOTE — Progress Notes (Signed)
   Covid-19 Vaccination Clinic  Name:  Mike Cruz    MRN: [id number] DOB: [birth year]  10/02/2019  [name] was observed post Covid-19 immunization for 15 minutes without incident. He was provided with Vaccine Information Sheet and instruction to access the V-Safe system.   [name] was instructed to call 911 with any severe reactions post vaccine: Marland Kitchen Difficulty breathing  . Swelling of face and throat  . A fast heartbeat  . A bad rash all over body  . Dizziness and weakness   Immunizations Administered    Name Date Dose VIS Date Route   Moderna COVID-19 Vaccine 10/02/2019 11:19 AM 0.5 mL 06/19/2019 Intramuscular   Manufacturer: Moderna   Lot: [id number]   [id number]

## 2019-10-04 ENCOUNTER — Ambulatory Visit: Payer: BC Managed Care – PPO | Admitting: Rheumatology

## 2019-10-09 NOTE — Progress Notes (Signed)
Office Visit Note  Patient: Mike Cruz             Date of Birth: 01-14-1966           MRN: [id number]             PCP: Josetta Huddle, MD Referring: Josetta Huddle, MD Visit Date: 10/17/2019 Occupation: @GUAROCC @  Subjective:  Follow-up on sarcoidosis.   History of Present Illness: Mike Cruz is a 54 y.o. male with history of sarcoidosis.  He states he has been doing well without any joint pain or joint swelling.  He denies any shortness of breath.  He denies any history of eye inflammation.  He states he sees his eye doctor regular basis.  He has been off methotrexate for over an year now.  He has not been on prednisone in a long time.  Activities of Daily Living:  Patient reports morning stiffness for 0 minutes.   Patient Denies nocturnal pain.  Difficulty dressing/grooming: Denies Difficulty climbing stairs: Denies Difficulty getting out of chair: Denies Difficulty using hands for taps, buttons, cutlery, and/or writing: Denies  Review of Systems  Constitutional: Negative for fatigue and night sweats.  HENT: Negative for mouth sores, mouth dryness and nose dryness.   Eyes: Negative for pain, redness, itching and dryness.  Respiratory: Negative for shortness of breath, wheezing and difficulty breathing.   Cardiovascular: Negative for chest pain, palpitations, hypertension, irregular heartbeat and swelling in legs/feet.  Gastrointestinal: Negative for blood in stool, constipation and diarrhea.  Endocrine: Negative for increased urination.  Genitourinary: Negative for difficulty urinating and painful urination.  Musculoskeletal: Negative for arthralgias, joint pain, joint swelling, myalgias, muscle weakness, morning stiffness, muscle tenderness and myalgias.  Skin: Negative for color change, rash, hair loss, nodules/bumps, redness, skin tightness, ulcers and sensitivity to sunlight.  Allergic/Immunologic: Negative for susceptible to infections.  Neurological: Negative for  dizziness, fainting, numbness, headaches, memory loss, night sweats and weakness.  Hematological: Negative for bruising/bleeding tendency and swollen glands.  Psychiatric/Behavioral: Negative for depressed mood, confusion and sleep disturbance. The patient is not nervous/anxious.     PMFS History:  Patient Active Problem List   Diagnosis Date Noted  . Genetic testing 12/07/2017  . Family history of kidney cancer   . Family history of ovarian cancer   . Family history of prostate cancer   . Family history of stomach cancer   . Family history of colon cancer   . Renal cell carcinoma ([place]) 11/03/2017  . Renal mass 04/20/2017  . History of hypertension 12/31/2016  . History of anemia 12/31/2016  . High risk medication use 12/31/2016  . History of renal calculi 12/31/2016  . History of prostatitis 12/31/2016  . On prednisone therapy 12/31/2016  . Abnormal serum angiotensin-converting enzyme level 12/31/2016  . Acute kidney injury superimposed on chronic kidney disease ([place]) 11/22/2016  . History of viral pneumonia 11/22/2016  . Anemia 09/27/2016  . Sarcoidosis   . Hypertension     Past Medical History:  Diagnosis Date  . Acute renal failure (ARF) ([place])   . Acute respiratory failure ([place])   . Anemia   . Family history of colon cancer   . Family history of kidney cancer   . Family history of ovarian cancer   . Family history of prostate cancer   . Family history of stomach cancer   . History of kidney stones   . Hypertension   . Hyponatremia   . Kidney stone    kidney mass 04/13/17  .  Pneumonia   . Sarcoidosis     Family History  Problem Relation Age of Onset  . Ovarian cancer Mother 49  . AAA (abdominal aortic aneurysm) Father   . Prostate cancer Father 23  . Kidney cancer Father 62       was 2 separate primaries, not a met  . Stroke Sister 66  . Healthy Daughter   . Healthy Daughter   . Healthy Son   . Lung cancer Maternal Aunt   . Stomach cancer Maternal  Grandmother 9  . Colon cancer Maternal Grandmother 39  . Prostate cancer Maternal Grandfather 65       metastatic  . Lymphoma Cousin   . Prostate cancer Cousin 21   Past Surgical History:  Procedure Laterality Date  . HEAD & NECK SKIN LESION EXCISIONAL BIOPSY    . KNEE SURGERY Left    Arthroscopic  . partial nephrectomt     Left 04/20/17 Dr. Tresa Moore  . ROBOTIC ASSITED PARTIAL NEPHRECTOMY Left 04/20/2017   Procedure: XI ROBOTIC ASSITED PARTIAL NEPHRECTOMY;  Surgeon: Alexis Frock, MD;  Location: [facility];  Service: Urology;  Laterality: Left;   Social History   Social History Narrative  . Not on file   Immunization History  Administered Date(s) Administered  . Influenza,inj,Quad PF,6+ Mos 04/18/2016, 04/21/2017, 03/22/2018  . Moderna SARS-COVID-2 Vaccination 08/30/2019, 10/02/2019     Objective: Vital Signs: BP 129/76 (BP Location: Left Arm, Patient Position: Sitting, Cuff Size: Normal)   Pulse 68   Resp 15   Ht 5' 9"  (1.753 m)   Wt 255 lb (115.7 kg)   BMI 37.66 kg/m    Physical Exam Vitals and nursing note reviewed.  Constitutional:      Appearance: He is well-developed.  HENT:     Head: Normocephalic and atraumatic.  Eyes:     Conjunctiva/sclera: Conjunctivae normal.     Pupils: Pupils are equal, round, and reactive to light.  Cardiovascular:     Rate and Rhythm: Normal rate and regular rhythm.     Heart sounds: Normal heart sounds.  Pulmonary:     Effort: Pulmonary effort is normal.     Breath sounds: Normal breath sounds.  Abdominal:     General: Bowel sounds are normal.     Palpations: Abdomen is soft.  Musculoskeletal:     Cervical back: Normal range of motion and neck supple.  Skin:    General: Skin is warm and dry.     Capillary Refill: Capillary refill takes less than 2 seconds.  Neurological:     Mental Status: He is alert and oriented to person, place, and time.  Psychiatric:        Behavior: Behavior normal.      Musculoskeletal Exam: C-spine  thoracic and lumbar spine with good range of motion.  Shoulder joints, elbow joints, wrist joints, MCPs PIPs DIPs with good range of motion with no synovitis.  Hip joints knee joints ankles MTPs PIPs DIPs with good range of motion with no synovitis. CDAI Exam: CDAI Score: -- Patient Global: --; Provider Global: -- Swollen: --; Tender: -- Joint Exam 10/17/2019   No joint exam has been documented for this visit   There is currently no information documented on the homunculus. Go to the Rheumatology activity and complete the homunculus joint exam.  Investigation: No additional findings.  Imaging: No results found.  Recent Labs: Lab Results  Component Value Date   WBC 6.6 09/27/2018   HGB 12.3 (L) 09/27/2018   PLT 165  09/27/2018   NA 140 09/27/2018   K 3.9 09/27/2018   CL 103 09/27/2018   CO2 29 09/27/2018   GLUCOSE 91 09/27/2018   BUN 21 09/27/2018   CREATININE 2.05 (H) 09/27/2018   BILITOT 0.4 09/27/2018   ALKPHOS 64 02/25/2017   AST 36 (H) 09/27/2018   ALT 36 09/27/2018   PROT 7.6 09/27/2018   ALBUMIN 4.3 02/25/2017   CALCIUM 10.3 09/27/2018   GFRAA 41 (L) 09/27/2018    Speciality Comments: No specialty comments available.  Procedures:  No procedures performed Allergies: Penicillins   Assessment / Plan:     Visit Diagnoses: Sarcoidosis - patient has longstanding history of sarcoidosis.  He was treated with methotrexate and prednisone by a previous rheumatologist.  He has been off methotrexate for 1 year now.  He had no recurrence of his disease.  He has appointment coming up with his pulmonologist as well.  He had no synovitis on examination today.  High risk medication use - d/c MTX due to elevated creatinine and AST in March 2020.  History of renal calculi  History of prostatitis  Chronic kidney disease (CKD) stage G3a - is followed by nephrology.  History of hypertension-his blood pressure is normal.  History of anemia  Orders: No orders of the defined  types were placed in this encounter.  No orders of the defined types were placed in this encounter.    Follow-Up Instructions: Return in about 6 months (around 04/17/2020) for Sarcoidosis.   Bo Merino, MD  Note - This record has been created using Editor, commissioning.  Chart creation errors have been sought, but may not always  have been located. Such creation errors do not reflect on  the standard of medical care.

## 2019-10-17 ENCOUNTER — Other Ambulatory Visit: Payer: Self-pay

## 2019-10-17 ENCOUNTER — Ambulatory Visit: Payer: BC Managed Care – PPO | Admitting: Rheumatology

## 2019-10-17 ENCOUNTER — Encounter: Payer: Self-pay | Admitting: Physician Assistant

## 2019-10-17 VITALS — BP 129/76 | HR 68 | Resp 15 | Ht 69.0 in | Wt 255.0 lb

## 2019-10-17 DIAGNOSIS — Z79899 Other long term (current) drug therapy: Secondary | ICD-10-CM

## 2019-10-17 DIAGNOSIS — Z8679 Personal history of other diseases of the circulatory system: Secondary | ICD-10-CM

## 2019-10-17 DIAGNOSIS — Z87442 Personal history of urinary calculi: Secondary | ICD-10-CM

## 2019-10-17 DIAGNOSIS — D869 Sarcoidosis, unspecified: Secondary | ICD-10-CM

## 2019-10-17 DIAGNOSIS — Z87438 Personal history of other diseases of male genital organs: Secondary | ICD-10-CM | POA: Diagnosis not present

## 2019-10-17 DIAGNOSIS — Z862 Personal history of diseases of the blood and blood-forming organs and certain disorders involving the immune mechanism: Secondary | ICD-10-CM

## 2019-10-17 DIAGNOSIS — N1831 Chronic kidney disease, stage 3a: Secondary | ICD-10-CM

## 2019-11-14 ENCOUNTER — Ambulatory Visit (HOSPITAL_COMMUNITY)
Admission: RE | Admit: 2019-11-14 | Discharge: 2019-11-14 | Disposition: A | Discharge status: Home / Self Care | Payer: BC Managed Care – PPO | Source: Ambulatory Visit | Attending: Urology | Admitting: Urology

## 2019-11-14 ENCOUNTER — Other Ambulatory Visit: Payer: Self-pay

## 2019-11-14 ENCOUNTER — Other Ambulatory Visit (HOSPITAL_COMMUNITY): Payer: Self-pay | Admitting: Urology

## 2019-11-14 DIAGNOSIS — C642 Malignant neoplasm of left kidney, except renal pelvis: Secondary | ICD-10-CM | POA: Insufficient documentation

## 2020-03-26 ENCOUNTER — Ambulatory Visit: Payer: BC Managed Care – PPO | Admitting: Internal Medicine

## 2020-03-26 ENCOUNTER — Other Ambulatory Visit: Payer: Self-pay

## 2020-03-26 DIAGNOSIS — D869 Sarcoidosis, unspecified: Secondary | ICD-10-CM | POA: Diagnosis not present

## 2020-03-26 LAB — PULMONARY FUNCTION TEST
DL/VA % pred: 92 %
DL/VA: 4.03 ml/min/mmHg/L
DLCO cor % pred: 90 %
DLCO cor: 24.21 ml/min/mmHg
DLCO unc % pred: 90 %
DLCO unc: 24.21 ml/min/mmHg
FEF 25-75 Pre: 4.6 L/sec
FEF2575-%Pred-Pre: 157 %
FEV1-%Pred-Pre: 119 %
FEV1-Pre: 3.6 L
FEV1FVC-%Pred-Pre: 107 %
FEV6-%Pred-Pre: 114 %
FEV6-Pre: 4.22 L
FEV6FVC-%Pred-Pre: 103 %
FVC-%Pred-Pre: 110 %
FVC-Pre: 4.23 L
Pre FEV1/FVC ratio: 85 %
Pre FEV6/FVC Ratio: 100 %

## 2020-03-26 NOTE — Progress Notes (Signed)
Spirometry and DLCO completed today  ?

## 2020-03-27 ENCOUNTER — Encounter: Payer: Self-pay | Admitting: Internal Medicine

## 2020-03-27 ENCOUNTER — Ambulatory Visit: Payer: BC Managed Care – PPO | Admitting: Internal Medicine

## 2020-03-27 VITALS — BP 120/80 | HR 76 | Temp 97.9°F | Ht 69.0 in | Wt 233.2 lb

## 2020-03-27 DIAGNOSIS — D869 Sarcoidosis, unspecified: Secondary | ICD-10-CM

## 2020-03-27 NOTE — Progress Notes (Signed)
IOV 11/22/2016  Chief Complaint  Patient presents with  . Pulmonary Consult    Pt referred by Dr. Inda Merlin for sarcoid. Pt c/o SOB with activity x 9 months. Pt states he was recently hospitalized for BIL pna. Pt c/o non prod cough, chest congstion. Pt denies f/c/s and CP/tightness.     54 year old male. He is a [name]. 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 He tells me that starting July August 2017 started noticing left-sided cervical adenopathy associated with some night sweats and mild shortness of breath and fatigue. Workup in August 2017 : 03/11/2016 had CT chest and abdomen pelvis: I personally visualized this film and it is significant for diffuse adenopathy in the mediastinal and abdominal areas. 03/12/2016 the following day he underwent cervical lymph node biopsy by Dr. Arville Care and this showed granulomatous lymphadenitis.. Tthis led to the diagnosis of sarcoidosis. On the CT chest as pulmonary lung fields had just some scattered mild nodularity which is reflected on the chest x-ray from 10/19/2016 which I personally visualized that shows just mild interstitial prominence c/w aug 2017 films and improved from pneumonia admit march 2018. Then in September 2017 he was started on prednisone. By October November 2017 due to lack of resolution of the symptoms and the lymph node area he was referred to Dr.  Charlestine Night rheumatologist   (since retired) and was started on methotrexate. Patient states that he is on 10 mg of methotrexate on Thursday once weekly and 10 mg of methotrexate on Friday once weekly totaling over 20 mg of methotrexate weekly. He is on folic acid. With this he did improve upon his symptoms with the fatigue and shortness of breath and the lymphadenopathy. However he has not had follow-up CT imaging of his abdomen. Then in March 2018 was diagnosed with rhinovirus respiratory infection pneumonia and was admitted. HIV test was negative at  that time. He did have acute kidney injury which seemed to be improving as of 10/03/2016 but no follow-up labs since then on the computer. He also seems to have chronic anemia. CXR shows resultion April 2018. Echocardiogram 10/01/2016: shows left ventricle ejection fraction 55% with grade 1 diastolic dysfunction  Currently he has improved in terms of his chest x-ray. He still has some post pneumonia fatigue but otherwise is feeling stable.  He particularly denies any clinical diagnosis of sarcoidosis in his brain or eyes or skin or renal system although he is known to have high elevated creatinine for a few years which has been presumed due to hypertension. He has never seen a nephrologist.    OV 08/26/2017  Chief Complaint  Patient presents with  . Follow-up    sarcoidosis- feeling good slight congestion, getting over a cold    Follow-up sarcoidosis diagnosed in 2017 in the form of diffuse mediastinal and abdominal lymphadenopathy and cervical lymphadenopathy with some pulmonary infiltrates.  Started sometime in  fall 2017 methotrexate and prednisone  I personally saw him in May 2018.  At the time he was on 20 mg of methotrexate a week.  I reduce it to 10 mg/week.  I had him continue his daily prednisone.  Since that time he has had a partial left nephrectomy because of my referral to nephrologist because of increased creatinine.  This was done towards the end of 2018.  He is also seen Dr. Estanislado Pandy rheumatologist.  She has him now tapering on prednisone.  He is essentially asymptomatic from a respiratory standpoint.  2 days ago he picked up a cold but he is recovering well from that with Alka-Seltzer.  There are no other new issues no shortness of breath wheezing or cough or chills or B symptoms.  Latest creatinine shows chronic kidney insufficiency but this being followed by Dr. Madelon Lips   OV 12/22/2017  Chief Complaint  Patient presents with  . Follow-up    Breathing is improved since  last OV. Denies issues with SOB, chest tightness, wheezing or coughing.    Follow-up sarcoidosis diagnosed in 2017 in the form of diffuse mediastinal and abdominal lymphadenopathy and cervical lymphadenopathy with some pulmonary infiltrates.  Started sometime in  fall 2017 methotrexate and prednisone. Off prednisone as of June 2019 and on methotrexate 7.78m per week in June 2019.  Status post left partial nephrectomy for renal cell carcinoma April 20, 2017  Since last visit with me earlier in the year he is doing well.  Denies any respiratory symptoms or shortness of breath cough wheezing orthopnea proximal nocturnal dyspnea hemoptysis edema fever chills.  In addition he also denies any B symptoms with suggest nocturnal sweats.  He visited Dr. D rheumatologist in March 2019 and has been tapered off prednisone.  He is on a lower dose of methotrexate 7.5 mg once a week along with daily folic acid.  Her primary function test today that shows significant improvement in lung function.  Last chest x-ray was in April 2018 that was essentially clear except for some upper lobe infiltrates.  At this point is wondering if he can make further inroads into tapering his methotrexate.  Recent lab work is all characterized below and the look normal     Results for PNIGEL, ERICSSON(MRN [id number] as of 12/22/2017 14:53  Ref. Range 12/10/2016 16:07 12/22/2017 13:50  FVC-Pre Latest Units: L 3.68 4.17  FVC-%Pred-Pre Latest Units: % 94 108  Results for PSAYRE, WITHERINGTON(MRN [id number] as of 12/22/2017 14:53  Ref. Range 12/10/2016 16:07 12/22/2017 13:50  DLCO cor Latest Units: ml/min/mmHg 20.47 27.74  DLCO cor % pred Latest Units: % 68 92   Results for PYATES, WEISGERBER(MRN [id number] as of 12/22/2017 14:53  Ref. Range 11/24/2017 14:08  Creatinine Latest Ref Range: 0.70 - 1.33 mg/dL 1.99 (H)  Results for PDECLYN, DELSOL(MRN [id number] as of 12/22/2017 14:53  Ref. Range 11/24/2017 14:08  Hemoglobin Latest Ref Range: 13.2 - 17.1 g/dL  11.4 (L)  Results for PELVEN, LABOY(MRN [id number] as of 12/22/2017 14:53  Ref. Range 11/24/2017 14:08  WBC Latest Ref Range: 3.8 - 10.8 Thousand/uL 6.5  Results for PSHIHEEM, CORPORAN(MRN [id number] as of 12/22/2017 14:53  Ref. Range 11/24/2017 14:08  AST Latest Ref Range: 10 - 35 U/L 26  ALT Latest Ref Range: 9 - 46 U/L 23     OV 03/22/2018  Subjective:  Patient ID: RIsabella Stalling male , DOB: 4Jul 04, 1967, age MRN: [id number], ADDR[place] [address] [place]  03/22/2018 -   Chief Complaint  Patient presents with  . Follow-up    Pt states he has been doing well since last visit and denies any complaints.    Follow-up sarcoidosis diagnosed in 2017 in the form of diffuse mediastinal and abdominal lymphadenopathy and cervical lymphadenopathy with some pulmonary infiltrates.  Started sometime in  fall 2017 methotrexate and prednisone. Off prednisone as of June 2019 and on methotrexate 7.562mper week in June 2019.  Status post left partial nephrectomy for renal cell carcinoma April 20, 2017 .  REduced methotrxate to 38m per day June 2019. Off Mtx since leate 2019/early 2020     RDrake Wuertz89y.o. -continues to do well. He is asymptomatic. In August 2019 he had workup for potential bone lesions according to his history and chart review. 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 During this time he had a CT chest which shows improved medicine lymphadenopathy very small levels and also multiple pulmonary nodules that are really small. One-year follow-up CT is recommended.He is currently on methotrexate 5 mg once a week along with folic acid.. I personally visualized the CT chest from August 2019     ROS   OV 03/27/2020  Subjective:  Patient ID: RIsabella Stalling male , DOB: 404-17-1967, age 54y.o. , MRN: [id number], [place] [address] [place]  03/27/2020 -   Chief Complaint  Patient presents with  . Follow-up    here to go over pft  results   Follow-up sarcoidosis diagnosed in 2017 in the form of diffuse mediastinal and abdominal lymphadenopathy and cervical lymphadenopathy with some pulmonary infiltrates.  Started sometime in  fall 2017 methotrexate and prednisone. Off prednisone as of June 2019 and on methotrexate 7.533mper week in June 2019.  Status post left partial nephrectomy for renal cell carcinoma April 20, 2017 . REduced methotrxate to 69m31mer day June 2019.  Off methotrexate late 2019/early 2020    HPI RobLem Peary 42o. -presents for follow-up.  Last seen in September 2019.  After that not seen in September 2020 because of the pandemic.  However in September 2020 he did have a CT scan of the chest that showed continued response and nodules and mediastinal adenopathy.  Today he tells me has been off methotrexate since late 2019/early 2020.  He last saw Dr. D iKeturah Barre March 2021 and was noted that he was off methotrexate for over a year.  She also supported clinical follow-up.  He is feeling fine without any respiratory symptoms.  No B symptoms.  No abdominal symptoms.  He has had colonoscopy.  He has had his Covid vaccine.  However he is not had his flu shot.  He wants to have high-dose flu shot which we do not have in stock.  He is not had shingles vaccine but he said he will talk to his primary care physician.  He is interested in the booster vaccine for Covid as and when it is recommended for his age group.  Currently he is not immunosuppressed and therefore he does not qualify although if he wanted he can get it by directly communicating with the pharmacy.  He is aware of this.    PFT Results Latest Ref Rng & Units 03/26/2020 12/22/2017 12/10/2016  FVC-Pre L 4.23 4.17 3.68  FVC-Predicted Pre % 110 108 94  FVC-Post L - - 3.79  FVC-Predicted Post % - - 97  Pre FEV1/FVC % % 85 86 88  Post FEV1/FCV % % - - 91  FEV1-Pre L 3.60 3.60 3.25  FEV1-Predicted Pre % 119 117 105  FEV1-Post L - - 3.44  DLCO uncorrected ml/min/mmHg  24.21 24.86 19.07  DLCO UNC% % 90 82 63  DLCO corrected ml/min/mmHg 24.21 27.74 20.47  DLCO COR %Predicted % 90 92 68  DLVA Predicted % 92 104 93  TLC L - - 5.22  TLC % Predicted % - - 78  RV % Predicted % - - 67    ROS - per HPI  CT CHEST Sept 2020  IMPRESSION: 1. Stable chest CT without acute or suspicious findings. 2. Tiny pulmonary nodules are stable from 2017, consistent with benign findings. 3. Thoracic adenopathy shows continued improvement, likely due to the patient's underlying sarcoidosis. 4. Stable benign osseous findings.   Electronically Signed   By: Richardean Sale M.D.   On: 04/09/2019 08:53    has a past medical history of Acute renal failure (ARF) ([place]), Acute respiratory failure ([place]), Anemia, Family history of colon cancer, Family history of kidney cancer, Family history of ovarian cancer, Family history of prostate cancer, Family history of stomach cancer, History of kidney stones, Hypertension, Hyponatremia, Kidney stone, Pneumonia, and Sarcoidosis.   reports that he has never smoked. He has never used smokeless tobacco.  Past Surgical History:  Procedure Laterality Date  . HEAD & NECK SKIN LESION EXCISIONAL BIOPSY    . KNEE SURGERY Left    Arthroscopic  . partial nephrectomt     Left 04/20/17 Dr. Tresa Moore  . ROBOTIC ASSITED PARTIAL NEPHRECTOMY Left 04/20/2017   Procedure: XI ROBOTIC ASSITED PARTIAL NEPHRECTOMY;  Surgeon: Alexis Frock, MD;  Location: [facility];  Service: Urology;  Laterality: Left;    Allergies  Allergen Reactions  . Penicillins Hives    Denies airway involvement    Immunization History  Administered Date(s) Administered  . Influenza,inj,Quad PF,6+ Mos 04/18/2016, 04/21/2017, 03/22/2018  . Influenza-Unspecified 04/04/2019  . Moderna SARS-COVID-2 Vaccination 08/30/2019, 10/02/2019    Family History  Problem Relation Age of Onset  . Ovarian cancer Mother 66  . AAA (abdominal aortic aneurysm) Father   . Prostate cancer  Father 63  . Kidney cancer Father 25       was 2 separate primaries, not a met  . Stroke Sister 28  . Healthy Daughter   . Healthy Daughter   . Healthy Son   . Lung cancer Maternal Aunt   . Stomach cancer Maternal Grandmother 30  . Colon cancer Maternal Grandmother 46  . Prostate cancer Maternal Grandfather 65       metastatic  . Lymphoma Cousin   . Prostate cancer Cousin 50     Current Outpatient Medications:  .  Calcium Carb-Cholecalciferol (CALCIUM-VITAMIN D) 500-200 MG-UNIT tablet, Take 1 tablet by mouth daily., Disp: , Rfl:  .  Olmesartan-Amlodipine-HCTZ 40-5-12.5 MG TABS, Take 1 tablet by mouth every morning., Disp: , Rfl: 3      Objective:   Vitals:   03/27/20 0858  BP: 120/80  Pulse: 76  Temp: 97.9 F (36.6 C)  TempSrc: Oral  SpO2: 98%  Weight: 233 lb 3.2 oz (105.8 kg)  Height: 5' 9"  (1.753 m)    Estimated body mass index is 34.44 kg/m as calculated from the following:   Height as of this encounter: 5' 9"  (1.753 m).   Weight as of this encounter: 233 lb 3.2 oz (105.8 kg).  @WEIGHTCHANGE @  [employer]   03/27/20 0858  Weight: 233 lb 3.2 oz (105.8 kg)     Physical Exam  General Appearance:    Alert, cooperative, no distress, appears stated age - yes , Deconditioned looking - no , OBESE  - no, Sitting on Wheelchair -  no  Head:    Normocephalic, without obvious abnormality, atraumatic  Eyes:    PERRL, conjunctiva/corneas clear,  Ears:    Normal TM's and external ear canals, both ears  Nose:   Nares normal, septum midline, mucosa normal, no drainage    or sinus tenderness. OXYGEN ON  - ra .  Patient is @ ra   Throat:   Lips, mucosa, and tongue normal; teeth and gums normal. Cyanosis on lips - no  Neck:   Supple, symmetrical, trachea midline, no adenopathy;    thyroid:  no enlargement/tenderness/nodules; no carotid   bruit or JVD  Back:     Symmetric, no curvature, ROM normal, no CVA tenderness  Lungs:     Distress - no , Wheeze no, Barrell Chest -  no, Purse lip breathing - no, Crackles - no   Chest Wall:    No tenderness or deformity.    Heart:    Regular rate and rhythm, S1 and S2 normal, no rub   or gallop, Murmur - no  Breast Exam:    NOT DONE  Abdomen:     Soft, non-tender, bowel sounds active all four quadrants,    no masses, no organomegaly. Visceral obesity - mild  Genitalia:   NOT DONE  Rectal:   NOT DONE  Extremities:   Extremities - normal, Has Cane - no, Clubbing - no, Edema - no  Pulses:   2+ and symmetric all extremities  Skin:   Stigmata of Connective Tissue Disease - no  Lymph nodes:   Cervical, supraclavicular, and axillary nodes normal  Psychiatric:  Neurologic:   Pleasant - yes, Anxious - no, Flat affect - no  CAm-ICU - neg, Alert and Oriented x 3 - yes, Moves all 4s - yes, Speech - normal, Cognition - intact           Assessment:       ICD-10-CM   1. Sarcoidosis  D86.9 Pulmonary function test       Plan:     Patient Instructions     ICD-10-CM   1. Sarcoidosis  D86.9      CT chest aug 2019 compared to aug 2017 through sept 2020 shows improved response in sarcoidosis PFT Sept 2021 shows conitnued improvement   Plan Monitor off methotrexate and prednisone clinically Recommend flu shot  When available Please talk to PCP Josetta Huddle, MD -  and ensure you get  shingrix ([place]) inactivated vaccine against shingles Booster covid vaccine as and when CDC recommends  It (currently you are not immune suppressed)  Followup 12 months do  Spirometry and dlco 12 months regular 15 min followup     SIGNATURE    Dr. Brand Males, M.D., F.C.C.P,  Pulmonary and Critical Care Medicine Staff Physician, [facility] Director - Interstitial Lung Disease  Program  Pulmonary [place] [facility] [place], [place], [address]  Pager: [phone], If no answer or between  15:00h - 7:00h: call [phone] Telephone: [phone]  9:14  AM 03/27/2020

## 2020-03-27 NOTE — Patient Instructions (Addendum)
ICD-10-CM   1. Sarcoidosis  D86.9      CT chest aug 2019 compared to aug 2017 through sept 2020 shows improved response in sarcoidosis PFT Sept 2021 shows conitnued improvement   Plan Monitor off methotrexate and prednisone clinically Recommend flu shot  When available Please talk to PCP Josetta Huddle, MD -  and ensure you get  shingrix ([place]) inactivated vaccine against shingles Booster covid vaccine as and when CDC recommends  It (currently you are not immune suppressed)  Followup 12 months do  Spirometry and dlco 12 months regular 15 min followup

## 2020-04-04 NOTE — Progress Notes (Signed)
Office Visit Note  Patient: Mike Cruz             Date of Birth: [birth year]           MRN: [id number]             PCP: Mike Huddle, MD Referring: Mike Huddle, MD Visit Date: 04/16/2020 Occupation: @GUAROCC @  Subjective:  Routine follow up   History of Present Illness: Mike Cruz is a 54 y.o. male with history of sarcoidosis. He was previously taking methotrexate (started in 2017 by Dr. Charlestine Night) but discontinued in 2020 due to elevated creatinine and LFTs.  He has not had any recurrence of symptoms since discontinuing MTX.  He has not had any shortness of breath, coughing, fatigue, EN lesions, rashes, or eye inflammation.  He denies any joint pain or joint swelling at this time.  He continues to follow up with Dr. Chase Caller on a yearly basis.  He is also seeing his ophthalmologist on a yearly basis.    He has not had any recent infections.  He has received both covid-19 vaccines and is planning on receiving the booster dose.  He is also planning on receiving the annual influenza vaccine.    Activities of Daily Living:  Patient reports morning stiffness for 0  none.   Patient Denies nocturnal pain.  Difficulty dressing/grooming: Denies Difficulty climbing stairs: Denies Difficulty getting out of chair: Denies Difficulty using hands for taps, buttons, cutlery, and/or writing: Denies  Review of Systems  Constitutional: Negative for fatigue and night sweats.  HENT: Negative for mouth sores, mouth dryness and nose dryness.   Eyes: Negative for redness and dryness.  Respiratory: Negative for shortness of breath and difficulty breathing.   Cardiovascular: Negative for chest pain, palpitations, hypertension, irregular heartbeat and swelling in legs/feet.  Gastrointestinal: Negative for constipation and diarrhea.  Endocrine: Negative for increased urination.  Genitourinary: Negative for painful urination.  Musculoskeletal: Negative for arthralgias, joint pain, joint swelling,  myalgias, muscle weakness, morning stiffness, muscle tenderness and myalgias.  Skin: Negative for color change, rash, hair loss, nodules/bumps, skin tightness, ulcers and sensitivity to sunlight.  Allergic/Immunologic: Negative for susceptible to infections.  Neurological: Negative for dizziness, fainting, numbness, memory loss, night sweats and weakness.  Hematological: Negative for bruising/bleeding tendency and swollen glands.  Psychiatric/Behavioral: Negative for depressed mood and sleep disturbance. The patient is not nervous/anxious.     PMFS History:  Patient Active Problem List   Diagnosis Date Noted  . Genetic testing 12/07/2017  . Family history of kidney cancer   . Family history of ovarian cancer   . Family history of prostate cancer   . Family history of stomach cancer   . Family history of colon cancer   . Renal cell carcinoma ([place]) 11/03/2017  . Renal mass 04/20/2017  . History of hypertension 12/31/2016  . History of anemia 12/31/2016  . High risk medication use 12/31/2016  . History of renal calculi 12/31/2016  . History of prostatitis 12/31/2016  . On prednisone therapy 12/31/2016  . Abnormal serum angiotensin-converting enzyme level 12/31/2016  . Acute kidney injury superimposed on chronic kidney disease ([place]) 11/22/2016  . History of viral pneumonia 11/22/2016  . Anemia 09/27/2016  . Sarcoidosis   . Hypertension     Past Medical History:  Diagnosis Date  . Acute renal failure (ARF) ([place])   . Acute respiratory failure ([place])   . Anemia   . Family history of colon cancer   . Family history of kidney cancer   .  Family history of ovarian cancer   . Family history of prostate cancer   . Family history of stomach cancer   . History of kidney stones   . Hypertension   . Hyponatremia   . Kidney stone    kidney mass 04/13/17  . Pneumonia   . Sarcoidosis     Family History  Problem Relation Age of Onset  . Ovarian cancer Mother 35  . AAA (abdominal aortic  aneurysm) Father   . Prostate cancer Father 41  . Kidney cancer Father 36       was 2 separate primaries, not a met  . Stroke Sister 40  . Healthy Daughter   . Healthy Daughter   . Healthy Son   . Lung cancer Maternal Aunt   . Stomach cancer Maternal Grandmother 31  . Colon cancer Maternal Grandmother 14  . Prostate cancer Maternal Grandfather 65       metastatic  . Lymphoma Cousin   . Prostate cancer Cousin 44   Past Surgical History:  Procedure Laterality Date  . HEAD & NECK SKIN LESION EXCISIONAL BIOPSY    . KNEE SURGERY Left    Arthroscopic  . partial nephrectomt     Left 04/20/17 Dr. Tresa Moore  . ROBOTIC ASSITED PARTIAL NEPHRECTOMY Left 04/20/2017   Procedure: XI ROBOTIC ASSITED PARTIAL NEPHRECTOMY;  Surgeon: Alexis Frock, MD;  Location: [facility];  Service: Urology;  Laterality: Left;   Social History   Social History Narrative  . Not on file   Immunization History  Administered Date(s) Administered  . Influenza,inj,Quad PF,6+ Mos 04/18/2016, 04/21/2017, 03/22/2018  . Influenza-Unspecified 04/04/2019  . Moderna SARS-COVID-2 Vaccination 08/30/2019, 10/02/2019     Objective: Vital Signs: BP 119/66 (BP Location: Left Arm, Patient Position: Sitting, Cuff Size: Normal)   Pulse (!) 59   Resp 14   Ht 5' 9"  (1.753 m)   Wt 232 lb 6.4 oz (105.4 kg)   BMI 34.32 kg/m    Physical Exam Vitals and nursing note reviewed.  Constitutional:      Appearance: He is well-developed.  HENT:     Head: Normocephalic and atraumatic.  Eyes:     Conjunctiva/sclera: Conjunctivae normal.     Pupils: Pupils are equal, round, and reactive to light.  Pulmonary:     Effort: Pulmonary effort is normal.  Abdominal:     Palpations: Abdomen is soft.  Musculoskeletal:     Cervical back: Normal range of motion and neck supple.  Skin:    General: Skin is warm and dry.     Capillary Refill: Capillary refill takes less than 2 seconds.  Neurological:     Mental Status: He is alert and oriented  to person, place, and time.  Psychiatric:        Behavior: Behavior normal.      Musculoskeletal Exam: C-spine, thoracic spine, and lumbar spine good ROM.  Shoulder joints, elbow joints,  wrist joints, MCPs, PIPs, and DIPs good ROM with no synovitis. Complete fist formation bilaterally.  Hip joints, knee joints, and ankle joints good ROM with no discomfort.  No warmth or effusion of knee joints. No tenderness or swelling of ankle joints.   CDAI Exam: CDAI Score: -- Patient Global: --; Provider Global: -- Swollen: --; Tender: -- Joint Exam 04/16/2020   No joint exam has been documented for this visit   There is currently no information documented on the homunculus. Go to the Rheumatology activity and complete the homunculus joint exam.  Investigation: No additional findings.  Imaging: No results found.  Recent Labs: Lab Results  Component Value Date   WBC 6.6 09/27/2018   HGB 12.3 (L) 09/27/2018   PLT 165 09/27/2018   NA 140 09/27/2018   K 3.9 09/27/2018   CL 103 09/27/2018   CO2 29 09/27/2018   GLUCOSE 91 09/27/2018   BUN 21 09/27/2018   CREATININE 2.05 (H) 09/27/2018   BILITOT 0.4 09/27/2018   ALKPHOS 64 02/25/2017   AST 36 (H) 09/27/2018   ALT 36 09/27/2018   PROT 7.6 09/27/2018   ALBUMIN 4.3 02/25/2017   CALCIUM 10.3 09/27/2018   GFRAA 41 (L) 09/27/2018    Speciality Comments: No specialty comments available.  Procedures:  No procedures performed Allergies: Penicillins   Assessment / Plan:     Visit Diagnoses: Sarcoidosis - Longstanding history of sarcoidosis-dx in 2017, CT 03/11/16: diffuse adenopathy in mediastinal and abdominal areas, LN biopsy 03/12/16-granulomatous lymphadenitis: He was diagnosed with sarcoidosis in 2017 and was started on MTX by Dr. Charlestine Night.  His symptoms clinically improved significantly while taking MTX.  He discontinued MTX in 2020 due to elevated creatinine and elevated LFTs.  He has not had any recurrence in symptoms since  discontinuing MTX.  He follows up yearly with Dr. Chase Caller and his ophthalmologist. He is not experiencing any shortness of breath, coughing, fatigue, or night sweats.  He had a chest x-ray on 11/14/2019 which was unremarkable.  PFTs updated on 03/27/20.  He has not had any eye inflammation, photophobia, or eye pain recently.  He has no joint tenderness or synovitis on exam.  No EN lesions noted on exam.  He does not require immunosuppressive therapy at this time.  We will continue to monitor him closely while off of MTX.  He was advised to notify us if he develops any signs or symptoms of a flare.  He will follow up in 6 months.   High risk medication use - d/c MTX due to elevated creatinine and AST in March 2020.  Other medical conditions are listed as follows:   History of renal calculi  History of prostatitis  Chronic kidney disease (CKD) stage G3a - followed by nephrology.  History of anemia  History of hypertension  Orders: No orders of the defined types were placed in this encounter.  No orders of the defined types were placed in this encounter.    Follow-Up Instructions: Return in about 6 months (around 10/14/2020) for Sarcoidosis.   Ofilia Neas, PA-C  Note - This record has been created using Dragon software.  Chart creation errors have been sought, but may not always  have been located. Such creation errors do not reflect on  the standard of medical care.

## 2020-04-16 ENCOUNTER — Encounter: Payer: Self-pay | Admitting: Physician Assistant

## 2020-04-16 ENCOUNTER — Other Ambulatory Visit: Payer: Self-pay

## 2020-04-16 ENCOUNTER — Ambulatory Visit: Payer: BC Managed Care – PPO | Admitting: Physician Assistant

## 2020-04-16 VITALS — BP 119/66 | HR 59 | Resp 14 | Ht 69.0 in | Wt 232.4 lb

## 2020-04-16 DIAGNOSIS — Z79899 Other long term (current) drug therapy: Secondary | ICD-10-CM

## 2020-04-16 DIAGNOSIS — Z87438 Personal history of other diseases of male genital organs: Secondary | ICD-10-CM | POA: Diagnosis not present

## 2020-04-16 DIAGNOSIS — Z862 Personal history of diseases of the blood and blood-forming organs and certain disorders involving the immune mechanism: Secondary | ICD-10-CM

## 2020-04-16 DIAGNOSIS — Z87442 Personal history of urinary calculi: Secondary | ICD-10-CM

## 2020-04-16 DIAGNOSIS — D869 Sarcoidosis, unspecified: Secondary | ICD-10-CM | POA: Diagnosis not present

## 2020-04-16 DIAGNOSIS — Z8679 Personal history of other diseases of the circulatory system: Secondary | ICD-10-CM

## 2020-04-16 DIAGNOSIS — N1831 Chronic kidney disease, stage 3a: Secondary | ICD-10-CM

## 2020-04-16 NOTE — Patient Instructions (Signed)

## 2020-05-15 ENCOUNTER — Ambulatory Visit: Payer: BC Managed Care – PPO | Attending: Family

## 2020-05-15 DIAGNOSIS — Z23 Encounter for immunization: Secondary | ICD-10-CM

## 2020-07-02 NOTE — Progress Notes (Signed)
° °  Covid-19 Vaccination Clinic  Name:  Mike Cruz    MRN: [id number] DOB: [birth year]  07/02/2020  [name] was observed post Covid-19 immunization for 15 minutes without incident. He was provided with Vaccine Information Sheet and instruction to access the V-Safe system.   [name] was instructed to call 911 with any severe reactions post vaccine:  Difficulty breathing   Swelling of face and throat   A fast heartbeat   A bad rash all over body   Dizziness and weakness   Immunizations Administered    No immunizations on file.

## 2020-09-18 ENCOUNTER — Ambulatory Visit: Payer: BC Managed Care – PPO | Admitting: Rheumatology

## 2020-10-02 NOTE — Progress Notes (Deleted)
Office Visit Note  Patient: Mike Cruz             Date of Birth: December 18, 1965           MRN: [id number]             PCP: Josetta Huddle, MD Referring: Josetta Huddle, MD Visit Date: 10/15/2020 Occupation: @GUAROCC @  Subjective:  No chief complaint on file.   History of Present Illness: Mike Cruz is a 55 y.o. male ***   Activities of Daily Living:  Patient reports morning stiffness for *** {minute/hour:19697}.   Patient {ACTIONS;DENIES/REPORTS:21021675::"Denies"} nocturnal pain.  Difficulty dressing/grooming: {ACTIONS;DENIES/REPORTS:21021675::"Denies"} Difficulty climbing stairs: {ACTIONS;DENIES/REPORTS:21021675::"Denies"} Difficulty getting out of chair: {ACTIONS;DENIES/REPORTS:21021675::"Denies"} Difficulty using hands for taps, buttons, cutlery, and/or writing: {ACTIONS;DENIES/REPORTS:21021675::"Denies"}  No Rheumatology ROS completed.   PMFS History:  Patient Active Problem List   Diagnosis Date Noted  . Genetic testing 12/07/2017  . Family history of kidney cancer   . Family history of ovarian cancer   . Family history of prostate cancer   . Family history of stomach cancer   . Family history of colon cancer   . Renal cell carcinoma ([place]) 11/03/2017  . Renal mass 04/20/2017  . History of hypertension 12/31/2016  . History of anemia 12/31/2016  . High risk medication use 12/31/2016  . History of renal calculi 12/31/2016  . History of prostatitis 12/31/2016  . On prednisone therapy 12/31/2016  . Abnormal serum angiotensin-converting enzyme level 12/31/2016  . Acute kidney injury superimposed on chronic kidney disease ([place]) 11/22/2016  . History of viral pneumonia 11/22/2016  . Anemia 09/27/2016  . Sarcoidosis   . Hypertension     Past Medical History:  Diagnosis Date  . Acute renal failure (ARF) ([place])   . Acute respiratory failure ([place])   . Anemia   . Family history of colon cancer   . Family history of kidney cancer   . Family history of ovarian  cancer   . Family history of prostate cancer   . Family history of stomach cancer   . History of kidney stones   . Hypertension   . Hyponatremia   . Kidney stone    kidney mass 04/13/17  . Pneumonia   . Sarcoidosis     Family History  Problem Relation Age of Onset  . Ovarian cancer Mother 73  . AAA (abdominal aortic aneurysm) Father   . Prostate cancer Father 5  . Kidney cancer Father 48       was 2 separate primaries, not a met  . Stroke Sister 55  . Healthy Daughter   . Healthy Daughter   . Healthy Son   . Lung cancer Maternal Aunt   . Stomach cancer Maternal Grandmother 57  . Colon cancer Maternal Grandmother 3  . Prostate cancer Maternal Grandfather 65       metastatic  . Lymphoma Cousin   . Prostate cancer Cousin 29   Past Surgical History:  Procedure Laterality Date  . HEAD & NECK SKIN LESION EXCISIONAL BIOPSY    . KNEE SURGERY Left    Arthroscopic  . partial nephrectomt     Left 04/20/17 Dr. Tresa Moore  . ROBOTIC ASSITED PARTIAL NEPHRECTOMY Left 04/20/2017   Procedure: XI ROBOTIC ASSITED PARTIAL NEPHRECTOMY;  Surgeon: Alexis Frock, MD;  Location: [facility];  Service: Urology;  Laterality: Left;   Social History   Social History Narrative  . Not on file   Immunization History  Administered Date(s) Administered  . Influenza,inj,Quad PF,6+ Mos 04/18/2016, 04/21/2017, 03/22/2018  .  Influenza-Unspecified 04/04/2019  . Moderna SARS-COV2 Booster Vaccination 05/15/2020  . Moderna Sars-Covid-2 Vaccination 08/30/2019, 10/02/2019     Objective: Vital Signs: There were no vitals taken for this visit.   Physical Exam   Musculoskeletal Exam: ***  CDAI Exam: CDAI Score: - Patient Global: -; Provider Global: - Swollen: -; Tender: - Joint Exam 10/15/2020   No joint exam has been documented for this visit   There is currently no information documented on the homunculus. Go to the Rheumatology activity and complete the homunculus joint exam.  Investigation: No  additional findings.  Imaging: No results found.  Recent Labs: Lab Results  Component Value Date   WBC 6.6 09/27/2018   HGB 12.3 (L) 09/27/2018   PLT 165 09/27/2018   NA 140 09/27/2018   K 3.9 09/27/2018   CL 103 09/27/2018   CO2 29 09/27/2018   GLUCOSE 91 09/27/2018   BUN 21 09/27/2018   CREATININE 2.05 (H) 09/27/2018   BILITOT 0.4 09/27/2018   ALKPHOS 64 02/25/2017   AST 36 (H) 09/27/2018   ALT 36 09/27/2018   PROT 7.6 09/27/2018   ALBUMIN 4.3 02/25/2017   CALCIUM 10.3 09/27/2018   GFRAA 41 (L) 09/27/2018    Speciality Comments: No specialty comments available.  Procedures:  No procedures performed Allergies: Penicillins   Assessment / Plan:     Visit Diagnoses: No diagnosis found.  Orders: No orders of the defined types were placed in this encounter.  No orders of the defined types were placed in this encounter.   Face-to-face time spent with patient was *** minutes. Greater than 50% of time was spent in counseling and coordination of care.  Follow-Up Instructions: No follow-ups on file.   Earnestine Mealing, CMA  Note - This record has been created using Editor, commissioning.  Chart creation errors have been sought, but may not always  have been located. Such creation errors do not reflect on  the standard of medical care.

## 2020-10-15 ENCOUNTER — Ambulatory Visit: Payer: BC Managed Care – PPO | Admitting: Rheumatology

## 2020-10-15 DIAGNOSIS — N1831 Chronic kidney disease, stage 3a: Secondary | ICD-10-CM

## 2020-10-15 DIAGNOSIS — Z87438 Personal history of other diseases of male genital organs: Secondary | ICD-10-CM

## 2020-10-15 DIAGNOSIS — Z79899 Other long term (current) drug therapy: Secondary | ICD-10-CM

## 2020-10-15 DIAGNOSIS — Z8679 Personal history of other diseases of the circulatory system: Secondary | ICD-10-CM

## 2020-10-15 DIAGNOSIS — Z87442 Personal history of urinary calculi: Secondary | ICD-10-CM

## 2020-10-15 DIAGNOSIS — Z862 Personal history of diseases of the blood and blood-forming organs and certain disorders involving the immune mechanism: Secondary | ICD-10-CM

## 2020-10-15 DIAGNOSIS — D869 Sarcoidosis, unspecified: Secondary | ICD-10-CM

## 2020-10-22 NOTE — Progress Notes (Signed)
Office Visit Note  Patient: Mike Cruz             Date of Birth: [birth year]           MRN: [id number]             PCP: Josetta Huddle, MD Referring: Josetta Huddle, MD Visit Date: 10/23/2020 Occupation: @GUAROCC @  Subjective:  Other (Right great toe swelling- patient was seen by [facility] and prescribed prednisone )   History of Present Illness: Mike Cruz is a 55 y.o. male history of sarcoidosis.  According the patient about 2 weeks ago he had a new pair of shoes and after walking he developed some discomfort in his right first MTP joint.  He was seen by podiatrist who did x-rays and told him that he had some inflammation in his right first MTP joint.  He was given a pair of boot to wear and prednisone taper and the symptoms subsided.  Denies any shortness of breath.  None of the other joints are painful.  Activities of Daily Living:  Patient reports morning stiffness for 0 minutes.   Patient Denies nocturnal pain.  Difficulty dressing/grooming: Denies Difficulty climbing stairs: Denies Difficulty getting out of chair: Denies Difficulty using hands for taps, buttons, cutlery, and/or writing: Denies  Review of Systems  Constitutional: Negative for fatigue.  HENT: Negative for mouth sores, mouth dryness and nose dryness.   Eyes: Negative for pain, itching and dryness.  Respiratory: Negative for shortness of breath and difficulty breathing.   Cardiovascular: Negative for chest pain and palpitations.  Gastrointestinal: Negative for blood in stool, constipation and diarrhea.  Endocrine: Negative for increased urination.  Genitourinary: Negative for difficulty urinating.  Musculoskeletal: Positive for joint swelling. Negative for arthralgias, joint pain, myalgias, morning stiffness, muscle tenderness and myalgias.  Skin: Negative for color change, rash and redness.  Allergic/Immunologic: Negative for susceptible to infections.  Neurological: Negative for dizziness, numbness,  headaches, memory loss and weakness.  Hematological: Negative for bruising/bleeding tendency.  Psychiatric/Behavioral: Negative for confusion.    PMFS History:  Patient Active Problem List   Diagnosis Date Noted  . Genetic testing 12/07/2017  . Family history of kidney cancer   . Family history of ovarian cancer   . Family history of prostate cancer   . Family history of stomach cancer   . Family history of colon cancer   . Renal cell carcinoma ([place]) 11/03/2017  . Renal mass 04/20/2017  . History of hypertension 12/31/2016  . History of anemia 12/31/2016  . High risk medication use 12/31/2016  . History of renal calculi 12/31/2016  . History of prostatitis 12/31/2016  . On prednisone therapy 12/31/2016  . Abnormal serum angiotensin-converting enzyme level 12/31/2016  . Acute kidney injury superimposed on chronic kidney disease ([place]) 11/22/2016  . History of viral pneumonia 11/22/2016  . Anemia 09/27/2016  . Sarcoidosis   . Hypertension     Past Medical History:  Diagnosis Date  . Acute renal failure (ARF) ([place])   . Acute respiratory failure ([place])   . Anemia   . Family history of colon cancer   . Family history of kidney cancer   . Family history of ovarian cancer   . Family history of prostate cancer   . Family history of stomach cancer   . History of kidney stones   . Hypertension   . Hyponatremia   . Kidney stone    kidney mass 04/13/17  . Pneumonia   . Sarcoidosis     Family  History  Problem Relation Age of Onset  . Ovarian cancer Mother 74  . AAA (abdominal aortic aneurysm) Father   . Prostate cancer Father 15  . Kidney cancer Father 60       was 2 separate primaries, not a met  . Stroke Sister 24  . Healthy Daughter   . Healthy Daughter   . Healthy Son   . Lung cancer Maternal Aunt   . Stomach cancer Maternal Grandmother 70  . Colon cancer Maternal Grandmother 55  . Prostate cancer Maternal Grandfather 65       metastatic  . Lymphoma Cousin   .  Prostate cancer Cousin 57   Past Surgical History:  Procedure Laterality Date  . HEAD & NECK SKIN LESION EXCISIONAL BIOPSY    . KNEE SURGERY Left    Arthroscopic  . partial nephrectomt     Left 04/20/17 Dr. Tresa Moore  . ROBOTIC ASSITED PARTIAL NEPHRECTOMY Left 04/20/2017   Procedure: XI ROBOTIC ASSITED PARTIAL NEPHRECTOMY;  Surgeon: Alexis Frock, MD;  Location: [facility];  Service: Urology;  Laterality: Left;   Social History   Social History Narrative  . Not on file   Immunization History  Administered Date(s) Administered  . Influenza,inj,Quad PF,6+ Mos 04/18/2016, 04/21/2017, 03/22/2018  . Influenza-Unspecified 04/04/2019  . Moderna SARS-COV2 Booster Vaccination 05/15/2020  . Moderna Sars-Covid-2 Vaccination 08/30/2019, 10/02/2019     Objective: Vital Signs: BP 137/85 (BP Location: Left Arm, Patient Position: Sitting, Cuff Size: Large)   Pulse 62   Resp 16   Ht 5' 9"  (1.753 m)   Wt 239 lb 6.4 oz (108.6 kg)   BMI 35.35 kg/m    Physical Exam Vitals and nursing note reviewed.  Constitutional:      Appearance: He is well-developed.  HENT:     Head: Normocephalic and atraumatic.  Eyes:     Conjunctiva/sclera: Conjunctivae normal.     Pupils: Pupils are equal, round, and reactive to light.  Cardiovascular:     Rate and Rhythm: Normal rate and regular rhythm.     Heart sounds: Normal heart sounds.  Pulmonary:     Effort: Pulmonary effort is normal.     Breath sounds: Normal breath sounds.  Abdominal:     General: Bowel sounds are normal.     Palpations: Abdomen is soft.  Musculoskeletal:     Cervical back: Normal range of motion and neck supple.  Skin:    General: Skin is warm and dry.     Capillary Refill: Capillary refill takes less than 2 seconds.  Neurological:     Mental Status: He is alert and oriented to person, place, and time.  Psychiatric:        Behavior: Behavior normal.      Musculoskeletal Exam: C-spine thoracic and lumbar spine with good range of  motion.  Shoulder joints, elbow joints, wrist joints, MCPs PIPs and DIPs with good range of motion with no synovitis.  Hip joints, knee joints, ankles with good range of motion.  He had bilateral bunions.  No synovitis was noted.  CDAI Exam: CDAI Score: -- Patient Global: --; Provider Global: -- Swollen: --; Tender: -- Joint Exam 10/23/2020   No joint exam has been documented for this visit   There is currently no information documented on the homunculus. Go to the Rheumatology activity and complete the homunculus joint exam.  Investigation: No additional findings.  Imaging: No results found.  Recent Labs: Lab Results  Component Value Date   WBC 6.6 09/27/2018  HGB 12.3 (L) 09/27/2018   PLT 165 09/27/2018   NA 140 09/27/2018   K 3.9 09/27/2018   CL 103 09/27/2018   CO2 29 09/27/2018   GLUCOSE 91 09/27/2018   BUN 21 09/27/2018   CREATININE 2.05 (H) 09/27/2018   BILITOT 0.4 09/27/2018   ALKPHOS 64 02/25/2017   AST 36 (H) 09/27/2018   ALT 36 09/27/2018   PROT 7.6 09/27/2018   ALBUMIN 4.3 02/25/2017   CALCIUM 10.3 09/27/2018   GFRAA 41 (L) 09/27/2018    Speciality Comments: No specialty comments available.  Procedures:  No procedures performed Allergies: Penicillins   Assessment / Plan:     Visit Diagnoses: Sarcoidosis - Longstanding history of sarcoidosis-dx in 2017, CT 03/11/16: diffuse adenopathy in mediastinal and abdominal areas, LN biopsy 03/12/16-granulomatous lymphadenitis.  He continues to be asymptomatic.  There is no lymphadenopathy.  Chest was clear to auscultation.  He denies any shortness of breath.  He sees his pulmonologist once a year.  The chest x-ray from last year was within normal limits.  I advised him to contact me in case he develops any new symptoms of shortness of breath, enlarged lymph nodes, joint pain or joint swelling or rash.  High risk medication use - d/c MTX due to elevated creatinine and AST in March 2020.  Bilateral bunions-he has  bilateral bunions on my examination.  He developed some discomfort in his right first MTP after wearing new shoes.  Proper fitting shoes were advised.  History of renal calculi  Chronic kidney disease (CKD) stage G3a - followed by nephrology.  History of prostatitis  History of anemia  History of hypertension-blood pressure is normal today.  Orders: No orders of the defined types were placed in this encounter.  No orders of the defined types were placed in this encounter.  .  Follow-Up Instructions: Return in about 1 year (around 10/23/2021) for Sarcoidosis.   Bo Merino, MD  Note - This record has been created using Editor, commissioning.  Chart creation errors have been sought, but may not always  have been located. Such creation errors do not reflect on  the standard of medical care.

## 2020-10-23 ENCOUNTER — Other Ambulatory Visit: Payer: Self-pay

## 2020-10-23 ENCOUNTER — Encounter: Payer: Self-pay | Admitting: Rheumatology

## 2020-10-23 ENCOUNTER — Ambulatory Visit: Payer: BC Managed Care – PPO | Admitting: Rheumatology

## 2020-10-23 VITALS — BP 137/85 | HR 62 | Resp 16 | Ht 69.0 in | Wt 239.4 lb

## 2020-10-23 DIAGNOSIS — M21612 Bunion of left foot: Secondary | ICD-10-CM

## 2020-10-23 DIAGNOSIS — Z87442 Personal history of urinary calculi: Secondary | ICD-10-CM

## 2020-10-23 DIAGNOSIS — M21611 Bunion of right foot: Secondary | ICD-10-CM | POA: Diagnosis not present

## 2020-10-23 DIAGNOSIS — Z79899 Other long term (current) drug therapy: Secondary | ICD-10-CM | POA: Diagnosis not present

## 2020-10-23 DIAGNOSIS — Z862 Personal history of diseases of the blood and blood-forming organs and certain disorders involving the immune mechanism: Secondary | ICD-10-CM

## 2020-10-23 DIAGNOSIS — N1831 Chronic kidney disease, stage 3a: Secondary | ICD-10-CM

## 2020-10-23 DIAGNOSIS — D869 Sarcoidosis, unspecified: Secondary | ICD-10-CM

## 2020-10-23 DIAGNOSIS — Z8679 Personal history of other diseases of the circulatory system: Secondary | ICD-10-CM

## 2020-10-23 DIAGNOSIS — Z87438 Personal history of other diseases of male genital organs: Secondary | ICD-10-CM

## 2021-01-02 ENCOUNTER — Other Ambulatory Visit: Payer: Self-pay | Admitting: Urology

## 2021-01-02 DIAGNOSIS — K869 Disease of pancreas, unspecified: Secondary | ICD-10-CM

## 2021-01-07 ENCOUNTER — Ambulatory Visit: Payer: BC Managed Care – PPO | Attending: Family

## 2021-01-07 DIAGNOSIS — Z23 Encounter for immunization: Secondary | ICD-10-CM

## 2021-01-07 NOTE — Progress Notes (Signed)
   Covid-19 Vaccination Clinic  Name:  Mike Cruz    MRN: [id number] DOB: January 16, 1966  01/07/2021  [name] was observed post Covid-19 immunization for 15 minutes without incident. He was provided with Vaccine Information Sheet and instruction to access the V-Safe system.   [name] was instructed to call 911 with any severe reactions post vaccine: Difficulty breathing  Swelling of face and throat  A fast heartbeat  A bad rash all over body  Dizziness and weakness   Immunizations Administered     Name Date Dose VIS Date Route   Moderna Covid-19 Booster Vaccine 01/07/2021  3:51 PM 0.25 mL 05/07/2020 Intramuscular   Manufacturer: Moderna   Lot: [id number]   [place]erald: [id number]

## 2021-01-16 DIAGNOSIS — C642 Malignant neoplasm of left kidney, except renal pelvis: Secondary | ICD-10-CM

## 2021-01-16 HISTORY — DX: Malignant neoplasm of left kidney, except renal pelvis: C64.2

## 2021-01-18 ENCOUNTER — Ambulatory Visit
Admission: RE | Admit: 2021-01-18 | Discharge: 2021-01-18 | Disposition: A | Discharge status: Home / Self Care | Payer: BC Managed Care – PPO | Source: Ambulatory Visit | Attending: Urology | Admitting: Urology

## 2021-01-18 ENCOUNTER — Other Ambulatory Visit: Payer: Self-pay

## 2021-01-18 DIAGNOSIS — K869 Disease of pancreas, unspecified: Secondary | ICD-10-CM

## 2021-01-18 MED ORDER — GADOBENATE DIMEGLUMINE 529 MG/ML IV SOLN
20.0000 mL | Freq: Once | INTRAVENOUS | Status: AC | PRN
  Administered 2021-01-18: 20 mL via INTRAVENOUS

## 2021-03-05 ENCOUNTER — Telehealth: Payer: Self-pay | Admitting: Oncology

## 2021-03-05 NOTE — Telephone Encounter (Signed)
Scheduled appt per 8/17 sch msg. Pt aware.  

## 2021-03-25 ENCOUNTER — Inpatient Hospital Stay: Payer: BC Managed Care – PPO | Admitting: Oncology

## 2021-03-25 ENCOUNTER — Telehealth: Payer: Self-pay | Admitting: Licensed Clinical Social Worker

## 2021-03-25 ENCOUNTER — Other Ambulatory Visit: Payer: Self-pay

## 2021-03-25 ENCOUNTER — Inpatient Hospital Stay: Payer: BC Managed Care – PPO | Attending: Oncology

## 2021-03-25 ENCOUNTER — Other Ambulatory Visit: Payer: Self-pay | Admitting: Genetic Counselor

## 2021-03-25 ENCOUNTER — Inpatient Hospital Stay: Payer: BC Managed Care – PPO | Attending: Oncology | Admitting: Licensed Clinical Social Worker

## 2021-03-25 DIAGNOSIS — C649 Malignant neoplasm of unspecified kidney, except renal pelvis: Secondary | ICD-10-CM

## 2021-03-25 LAB — GENETIC SCREENING ORDER

## 2021-03-25 NOTE — Progress Notes (Deleted)
Hematology and Oncology Follow Up Visit  Mikie Caveny [id number] [birth year] 55 y.o. 03/25/2021 8:29 AM Josetta Huddle, MDGates, Herbie Baltimore, MD   Principle Diagnosis: 55 year old with recurrent kidney cancer diagnosed in 2018.  He had a papillary renal tumor at that time.  He developed recurrent disease with a new left upper posterior kidney mass measuring 1.6 cm,   Prior Therapy: He is status post a robotic left partial nephrectomy in October 2018.  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  Current therapy: Active surveillance.  Interim History: Mr. Stewardson returns today for a follow-up visit.  He is a pleasant gentleman evaluated in 2019 with history of kidney cancer.  He is currently under active surveillance with Dr. Barbee Cough and found to have a recurrent new left upper posterior tumor.   does not report any headaches, blurry vision, syncope or seizures. Does not report any fevers, chills or sweats.  Does not report any cough, wheezing or hemoptysis.  Does not report any chest pain, palpitation, orthopnea or leg edema.  Does not report any nausea, vomiting or abdominal pain.  Does not report any constipation or diarrhea.  Does not report any skeletal complaints.    Does not report frequency, urgency or hematuria.  Does not report any skin rashes or lesions. Does not report any heat or cold intolerance.  Does not report any lymphadenopathy or petechiae.  Does not report any anxiety or depression.  Remaining review of systems is negative.    Medications: {medication reviewed/display:3041432}  Current Outpatient Medications  Medication Sig Dispense Refill   Calcium Carb-Cholecalciferol (CALCIUM-VITAMIN D) 500-200 MG-UNIT tablet Take 1 tablet by mouth daily.     Olmesartan-Amlodipine-HCTZ 40-5-12.5 MG TABS Take 1 tablet by mouth every morning.  3   No current facility-administered medications for this visit.     Allergies:  Allergies  Allergen Reactions    Penicillins Hives    Denies airway involvement     Physical Exam:  ECOG:     General appearance: Comfortable appearing without any discomfort Head: Normocephalic without any trauma Oropharynx: Mucous membranes are moist and pink without any thrush or ulcers. Eyes: Pupils are equal and round reactive to light. Lymph nodes: No cervical, supraclavicular, inguinal or axillary lymphadenopathy.   Heart:regular rate and rhythm.  S1 and S2 without leg edema. Lung: Clear without any rhonchi or wheezes.  No dullness to percussion. Abdomin: Soft, nontender, nondistended with good bowel sounds.  No hepatosplenomegaly. Musculoskeletal: No joint deformity or effusion.  Full range of motion noted. Neurological: No deficits noted on motor, sensory and deep tendon reflex exam. Skin: No petechial rash or dryness.  Appeared moist.      Lab Results: Lab Results  Component Value Date   WBC 6.6 09/27/2018   HGB 12.3 (L) 09/27/2018   HCT 37.8 (L) 09/27/2018   MCV 87.5 09/27/2018   PLT 165 09/27/2018     Chemistry      Component Value Date/Time   NA 140 09/27/2018 1411   K 3.9 09/27/2018 1411   CL 103 09/27/2018 1411   CO2 29 09/27/2018 1411   BUN 21 09/27/2018 1411   CREATININE 2.05 (H) 09/27/2018 1411      Component Value Date/Time   CALCIUM 10.3 09/27/2018 1411   ALKPHOS 64 02/25/2017 1313   AST 36 (H) 09/27/2018 1411   ALT 36 09/27/2018 1411   BILITOT 0.4 09/27/2018 1411       Radiological Studies:  IMPRESSION: New 1.6 cm lesion in  the upper pole of the left kidney, suspicious for low-grade papillary renal cell carcinoma.   Stable postop changes from partial left nephrectomy. No evidence of abdominal metastatic disease.   2.0 cm simple appearing cystic lesion in the pancreatic body has increased in size from 1.3 cm on prior study in 2019. Consider continued imaging follow-up with MRI in 6 months, or EUS/FNA. This recommendation follows ACR consensus guidelines:  Management of Incidental Pancreatic Cysts: A White Paper of the ACR Incidental Findings Committee. [place] [id number].   Impression and Plan:   55 year old with:  1.  Multifocal left renal neoplasm.  He was diagnosed with T1 a papillary renal cell carcinoma of the left partial nephrectomy in 2018.  MRI obtained in July 2022 showed a 1.6 cm lesion in the upper pole of the left kidney suspicious for a recurrent disease.  The natural course of this disease was discussed today and treatment choices were reviewed.  He is currently followed by Dr. Tresa Moore consideration for active surveillance, versus surgical resection versus cryoablation were reiterated.  No role for medical oncology at this point is needed.  If he develops metastatic disease, systemic therapy could have a role.   2.  Hereditary renal syndrome: We will make the appropriate referral to genetics for an evaluation.  Consultation at the [facility] is also recommended per Dr. Johny Shears recommendation which I agree with.  3.  Osseous findings: Appears to be benign based on multiple imaging studies including CT scan of the chest in September 2020, bone scan in 2019 as well as MRI of the spine which is more suspicious for sarcoidosis.   4.  Follow-up: I am happy to see him in the future as needed.   30  minutes were dedicated to this visit. The time was spent on reviewing limaging studies, discussing treatment options, discussing differential diagnosis and answering questions regarding future plan.     Zola Button, MD 9/7/20228:29 AM

## 2021-03-25 NOTE — Telephone Encounter (Signed)
Left message for Mike Cruz that he missed our virtual genetic counseling appointment today. Requested call back to discuss/potentially reschedule.

## 2021-03-25 NOTE — Telephone Encounter (Signed)
Received call back from Mike Cruz. We discussed whether or not to reschedule his appointment as he had genetic testing that was comprehensive in 2019. We reviewed this testing and also offered the option of updated testing with RNA should he be interested, but that since he has already undergone genetic testing once he would likely have to pay the patient pay $250 price for further testing. Patient is not interested in further testing at this time but will call with any questions and if he does end up wanting to pursue an RNA panel.   His 2019 result for reference, full report located under molecular pathology:

## 2021-08-20 ENCOUNTER — Other Ambulatory Visit (HOSPITAL_COMMUNITY): Payer: Self-pay | Admitting: Urology

## 2021-08-20 ENCOUNTER — Other Ambulatory Visit: Payer: Self-pay | Admitting: Urology

## 2021-08-20 DIAGNOSIS — C642 Malignant neoplasm of left kidney, except renal pelvis: Secondary | ICD-10-CM

## 2021-08-28 ENCOUNTER — Other Ambulatory Visit: Payer: Self-pay

## 2021-08-28 ENCOUNTER — Ambulatory Visit (HOSPITAL_COMMUNITY)
Admission: RE | Admit: 2021-08-28 | Discharge: 2021-08-28 | Disposition: A | Discharge status: Home / Self Care | Payer: BC Managed Care – PPO | Source: Ambulatory Visit | Attending: Urology | Admitting: Urology

## 2021-08-28 DIAGNOSIS — C642 Malignant neoplasm of left kidney, except renal pelvis: Secondary | ICD-10-CM | POA: Insufficient documentation

## 2021-08-28 MED ORDER — GADOBUTROL 1 MMOL/ML IV SOLN
10.0000 mL | Freq: Once | INTRAVENOUS | Status: AC | PRN
  Administered 2021-08-28: 10 mL via INTRAVENOUS

## 2021-10-07 NOTE — Progress Notes (Deleted)
? ?Office Visit Note ? ?Patient: Mike Cruz             ?Date of Birth: [birth year]           ?MRN: [id number]             ?PCP: Mike Huddle, MD ?Referring: Mike Huddle, MD ?Visit Date: 10/21/2021 ?Occupation: _0 @ ? ?Subjective:  ?No chief complaint on file. ? ? ?History of Present Illness: Mike Cruz is a 56 y.o. male ***  ? ?Activities of Daily Living:  ?Patient reports morning stiffness for *** {minute/hour:19697}.   ?Patient {ACTIONS;DENIES/REPORTS:21021675::"Denies"} nocturnal pain.  ?Difficulty dressing/grooming: {ACTIONS;DENIES/REPORTS:21021675::"Denies"} ?Difficulty climbing stairs: {ACTIONS;DENIES/REPORTS:21021675::"Denies"} ?Difficulty getting out of chair: {ACTIONS;DENIES/REPORTS:21021675::"Denies"} ?Difficulty using hands for taps, buttons, cutlery, and/or writing: {ACTIONS;DENIES/REPORTS:21021675::"Denies"} ? ?No Rheumatology ROS completed.  ? ?PMFS History:  ?Patient Active Problem List  ? Diagnosis Date Noted  ? Genetic testing 12/07/2017  ? Family history of kidney cancer   ? Family history of ovarian cancer   ? Family history of prostate cancer   ? Family history of stomach cancer   ? Family history of colon cancer   ? Renal cell carcinoma ([place]) 11/03/2017  ? Renal mass 04/20/2017  ? History of hypertension 12/31/2016  ? History of anemia 12/31/2016  ? High risk medication use 12/31/2016  ? History of renal calculi 12/31/2016  ? History of prostatitis 12/31/2016  ? On prednisone therapy 12/31/2016  ? Abnormal serum angiotensin-converting enzyme level 12/31/2016  ? Acute kidney injury superimposed on chronic kidney disease ([place]) 11/22/2016  ? History of viral pneumonia 11/22/2016  ? Anemia 09/27/2016  ? Sarcoidosis   ? Hypertension   ?  ?Past Medical History:  ?Diagnosis Date  ? Acute renal failure (ARF) ([facility])   ? Acute respiratory failure ([place])   ? Anemia   ? Family history of colon cancer   ? Family history of kidney cancer   ? Family history of ovarian cancer   ? Family history of  prostate cancer   ? Family history of stomach cancer   ? History of kidney stones   ? Hypertension   ? Hyponatremia   ? Kidney stone   ? kidney mass 04/13/17  ? Pneumonia   ? Sarcoidosis   ?  ?Family History  ?Problem Relation Age of Onset  ? Ovarian cancer Mother 34  ? AAA (abdominal aortic aneurysm) Father   ? Prostate cancer Father 86  ? Kidney cancer Father 28  ?     was 2 separate primaries, not a met  ? Stroke Sister 38  ? Healthy Daughter   ? Healthy Daughter   ? Healthy Son   ? Lung cancer Maternal Aunt   ? Stomach cancer Maternal Grandmother 98  ? Colon cancer Maternal Grandmother 41  ? Prostate cancer Maternal Grandfather 60  ?     metastatic  ? Lymphoma Cousin   ? Prostate cancer Cousin 17  ? ?Past Surgical History:  ?Procedure Laterality Date  ? HEAD & NECK SKIN LESION EXCISIONAL BIOPSY    ? KNEE SURGERY Left   ? Arthroscopic  ? partial nephrectomt    ? Left 04/20/17 Dr. Tresa Moore  ? ROBOTIC ASSITED PARTIAL NEPHRECTOMY Left 04/20/2017  ? Procedure: XI ROBOTIC ASSITED PARTIAL NEPHRECTOMY;  Surgeon: Alexis Frock, MD;  Location: [facility];  Service: Urology;  Laterality: Left;  ? ?Social History  ? ?Social History Narrative  ? Not on file  ? ?Immunization History  ?Administered Date(s) Administered  ? Influenza,inj,Quad PF,6+ Mos 04/18/2016, 04/21/2017, 03/22/2018  ?  Influenza-Unspecified 04/04/2019  ? Moderna SARS-COV2 Booster Vaccination 05/15/2020, 01/07/2021  ? Moderna Sars-Covid-2 Vaccination 08/30/2019, 10/02/2019  ?  ? ?Objective: ?Vital Signs: There were no vitals taken for this visit.  ? ?Physical Exam  ? ?Musculoskeletal Exam: *** ? ?CDAI Exam: ?CDAI Score: -- ?Patient Global: --; Provider Global: -- ?Swollen: --; Tender: -- ?Joint Exam 10/21/2021  ? ?No joint exam has been documented for this visit  ? ?There is currently no information documented on the homunculus. Go to the Rheumatology activity and complete the homunculus joint exam. ? ?Investigation: ?No additional findings. ? ?Imaging: ?No results  found. ? ?Recent Labs: ?Lab Results  ?Component Value Date  ? WBC 6.6 09/27/2018  ? HGB 12.3 (L) 09/27/2018  ? PLT 165 09/27/2018  ? NA 140 09/27/2018  ? K 3.9 09/27/2018  ? CL 103 09/27/2018  ? CO2 29 09/27/2018  ? GLUCOSE 91 09/27/2018  ? BUN 21 09/27/2018  ? CREATININE 2.05 (H) 09/27/2018  ? BILITOT 0.4 09/27/2018  ? ALKPHOS 64 02/25/2017  ? AST 36 (H) 09/27/2018  ? ALT 36 09/27/2018  ? PROT 7.6 09/27/2018  ? ALBUMIN 4.3 02/25/2017  ? CALCIUM 10.3 09/27/2018  ? GFRAA 41 (L) 09/27/2018  ? ? ?Speciality Comments: No specialty comments available. ? ?Procedures:  ?No procedures performed ?Allergies: Penicillins  ? ?Assessment / Plan:     ?Visit Diagnoses: No diagnosis found. ? ?Orders: ?No orders of the defined types were placed in this encounter. ? ?No orders of the defined types were placed in this encounter. ? ? ?Face-to-face time spent with patient was *** minutes. Greater than 50% of time was spent in counseling and coordination of care. ? ?Follow-Up Instructions: No follow-ups on file. ? ? ?Earnestine Mealing, CMA ? ?Note - This record has been created using [employer].  ?Chart creation errors have been sought, but may not always  ?have been located. Such creation errors do not reflect on  ?the standard of medical care.  ?

## 2021-10-21 ENCOUNTER — Ambulatory Visit: Payer: BC Managed Care – PPO | Admitting: Rheumatology

## 2021-10-21 DIAGNOSIS — D869 Sarcoidosis, unspecified: Secondary | ICD-10-CM

## 2021-10-21 DIAGNOSIS — M21611 Bunion of right foot: Secondary | ICD-10-CM

## 2021-10-21 DIAGNOSIS — N1831 Chronic kidney disease, stage 3a: Secondary | ICD-10-CM

## 2021-10-21 DIAGNOSIS — Z87442 Personal history of urinary calculi: Secondary | ICD-10-CM

## 2021-10-21 DIAGNOSIS — Z79899 Other long term (current) drug therapy: Secondary | ICD-10-CM

## 2021-10-21 DIAGNOSIS — Z862 Personal history of diseases of the blood and blood-forming organs and certain disorders involving the immune mechanism: Secondary | ICD-10-CM

## 2021-10-21 DIAGNOSIS — Z87438 Personal history of other diseases of male genital organs: Secondary | ICD-10-CM

## 2021-10-21 DIAGNOSIS — Z8679 Personal history of other diseases of the circulatory system: Secondary | ICD-10-CM

## 2021-10-22 ENCOUNTER — Ambulatory Visit: Payer: BC Managed Care – PPO | Admitting: Rheumatology

## 2021-12-11 NOTE — Progress Notes (Signed)
Office Visit Note  Patient: Mike Cruz             Date of Birth: 11-Mar-1966           MRN: [id number]             PCP: Josetta Huddle, MD Referring: Josetta Huddle, MD Visit Date: 12/23/2021 Occupation: _0 @  Subjective:  Follow-up on sarcoidosis  History of Present Illness: Mike Cruz is a 56 y.o. male with history of sarcoidosis.  He denies any increased shortness of breath or cough.  There is no history of eye inflammation.  He denies any joint pain or joint swelling.  He states that he gets recent MRI of the abdomen which showed recurrence of the renal cell cancer.  He will be undergoing surgery for the renal cell cancer.  He had no recurrence of renal calcinosis.  Patient states that he has been followed closely by [facility] for renal insufficiency and also by [facility].  Activities of Daily Living:  Patient reports morning stiffness for 0  none .   Patient Denies nocturnal pain.  Difficulty dressing/grooming: Denies Difficulty climbing stairs: Denies Difficulty getting out of chair: Denies Difficulty using hands for taps, buttons, cutlery, and/or writing: Denies  Review of Systems  Constitutional:  Positive for fatigue.  HENT:  Negative for mouth dryness.   Eyes:  Negative for discharge.  Respiratory:  Negative for shortness of breath.   Cardiovascular:  Negative for swelling in legs/feet.  Gastrointestinal:  Negative for constipation.  Endocrine: Negative for excessive thirst.  Genitourinary:  Negative for difficulty urinating.  Musculoskeletal:  Negative for morning stiffness.  Skin:  Negative for rash.  Allergic/Immunologic: Negative for susceptible to infections.  Neurological:  Negative for numbness.  Hematological:  Negative for bruising/bleeding tendency.  Psychiatric/Behavioral:  Positive for sleep disturbance.    PMFS History:  Patient Active Problem List   Diagnosis Date Noted   Genetic testing 12/07/2017   Family history  of kidney cancer    Family history of ovarian cancer    Family history of prostate cancer    Family history of stomach cancer    Family history of colon cancer    Renal cell carcinoma ([place]) 11/03/2017   Renal mass 04/20/2017   History of hypertension 12/31/2016   History of anemia 12/31/2016   High risk medication use 12/31/2016   History of renal calculi 12/31/2016   History of prostatitis 12/31/2016   On prednisone therapy 12/31/2016   Abnormal serum angiotensin-converting enzyme level 12/31/2016   Acute kidney injury superimposed on chronic kidney disease ([place]) 11/22/2016   History of viral pneumonia 11/22/2016   Anemia 09/27/2016   Sarcoidosis    Hypertension     Past Medical History:  Diagnosis Date   Acute renal failure (ARF) ([place])    Acute respiratory failure (HCC)    Anemia    Family history of colon cancer    Family history of kidney cancer    Family history of ovarian cancer    Family history of prostate cancer    Family history of stomach cancer    History of kidney stones    Hypertension    Hyponatremia    Kidney stone    kidney mass 04/13/17   Pneumonia    Sarcoidosis     Family History  Problem Relation Age of Onset   Ovarian cancer Mother 33   AAA (abdominal aortic aneurysm) Father    Prostate cancer Father 45   Kidney cancer  Father 12       was 2 separate primaries, not a met   Stroke Sister 93   Healthy Daughter    Healthy Daughter    Healthy Son    Lung cancer Maternal Aunt    Stomach cancer Maternal Grandmother 3   Colon cancer Maternal Grandmother 75   Prostate cancer Maternal Grandfather 89       metastatic   Lymphoma Cousin    Prostate cancer Cousin 27   Past Surgical History:  Procedure Laterality Date   HEAD & NECK SKIN LESION EXCISIONAL BIOPSY     KNEE SURGERY Left    Arthroscopic   partial nephrectomt     Left 04/20/17 Dr. Tresa Moore   ROBOTIC ASSITED PARTIAL NEPHRECTOMY Left 04/20/2017   Procedure: XI ROBOTIC ASSITED PARTIAL  NEPHRECTOMY;  Surgeon: Alexis Frock, MD;  Location: [facility];  Service: Urology;  Laterality: Left;   Social History   Social History Narrative   Not on file   Immunization History  Administered Date(s) Administered   Influenza,inj,Quad PF,6+ Mos 04/18/2016, 04/21/2017, 03/22/2018   Influenza-Unspecified 04/04/2019   Moderna SARS-COV2 Booster Vaccination 05/15/2020, 01/07/2021   Moderna Sars-Covid-2 Vaccination 08/30/2019, 10/02/2019     Objective: Vital Signs: BP (!) 145/89 (BP Location: Left Arm, Patient Position: Sitting, Cuff Size: Large)   Pulse 75   Resp 15   Ht _0  (1.753 m)   Wt 251 lb (113.9 kg)   BMI 37.07 kg/m    Physical Exam Vitals and nursing note reviewed.  Constitutional:      Appearance: He is well-developed.  HENT:     Head: Normocephalic and atraumatic.  Eyes:     Conjunctiva/sclera: Conjunctivae normal.     Pupils: Pupils are equal, round, and reactive to light.  Cardiovascular:     Rate and Rhythm: Normal rate and regular rhythm.     Heart sounds: Normal heart sounds.  Pulmonary:     Effort: Pulmonary effort is normal.     Breath sounds: Normal breath sounds.  Abdominal:     General: Bowel sounds are normal.     Palpations: Abdomen is soft.  Musculoskeletal:     Cervical back: Normal range of motion and neck supple.  Skin:    General: Skin is warm and dry.     Capillary Refill: Capillary refill takes less than 2 seconds.  Neurological:     Mental Status: He is alert and oriented to person, place, and time.  Psychiatric:        Behavior: Behavior normal.     Musculoskeletal Exam: C-spine was in good range of motion.  Shoulder joints, elbow joints, wrist joints, MCPs PIPs and DIPs with good range of motion with no synovitis.  Hip joints, knee joints, ankles, MTPs and PIPs with good range of motion with no synovitis.  CDAI Exam: CDAI Score: -- Patient Global: --; Provider Global: -- Swollen: --; Tender: -- Joint Exam 12/23/2021   No  joint exam has been documented for this visit   There is currently no information documented on the homunculus. Go to the Rheumatology activity and complete the homunculus joint exam.  Investigation: No additional findings.  Imaging: No results found.  Recent Labs: Lab Results  Component Value Date   WBC 6.6 09/27/2018   HGB 12.3 (L) 09/27/2018   PLT 165 09/27/2018   NA 140 09/27/2018   K 3.9 09/27/2018   CL 103 09/27/2018   CO2 29 09/27/2018   GLUCOSE 91 09/27/2018   BUN 21  09/27/2018   CREATININE 2.05 (H) 09/27/2018   BILITOT 0.4 09/27/2018   ALKPHOS 64 02/25/2017   AST 36 (H) 09/27/2018   ALT 36 09/27/2018   PROT 7.6 09/27/2018   ALBUMIN 4.3 02/25/2017   CALCIUM 10.3 09/27/2018   GFRAA 41 (L) 09/27/2018    Speciality Comments: No specialty comments available.  Procedures:  No procedures performed Allergies: Penicillins   Assessment / Plan:     Visit Diagnoses: Sarcoidosis - Longstanding history of sarcoidosis-dx in 2017, CT 03/11/16: diffuse adenopathy in mediastinal and abdominal areas, LN biopsy 03/12/16-granulomatous lymphadenitis.  Patient was evaluated by Dr. Chase Caller in 2021.  CT chest in August 2019  was stable when compared to the CT scan of 2017.  Thoracic adenopathy was improved.  According to Dr. Golden Pop notes PFTs in September 2021 showed continued improvement.  He is advised to follow-up in 1 year for PFTs.  Advised patient to schedule a follow-up appointment with Dr. Chase Caller.  High risk medication use - d/c MTX due to elevated creatinine and AST in March 2020.  Bilateral bunions-stable with no increased pain.  History of renal calculi-he denies any recurrence.  Chronic kidney disease (CKD) stage G3a- I do not have any recent labs available.  Patient has been followed by nephrology.  Renal cell cancer of left kidney-patient had partial nephrectomy in 2018.  He has recurrence of renal cancer.  Patient will require surgery in the near  future.  History of hypertension-his blood pressure was elevated today.  Patient states it was due to rushing to the office.  History of prostatitis  History of anemia-most likely due to kidney disease.  Orders: No orders of the defined types were placed in this encounter.  No orders of the defined types were placed in this encounter.    Follow-Up Instructions: Return in about 1 year (around 12/24/2022) for Sarcoidosis.   Bo Merino, MD  Note - This record has been created using [employer].  Chart creation errors have been sought, but may not always  have been located. Such creation errors do not reflect on  the standard of medical care.

## 2021-12-23 ENCOUNTER — Encounter: Payer: Self-pay | Admitting: Rheumatology

## 2021-12-23 ENCOUNTER — Telehealth: Payer: Self-pay | Admitting: *Deleted

## 2021-12-23 ENCOUNTER — Ambulatory Visit: Payer: BC Managed Care – PPO | Admitting: Rheumatology

## 2021-12-23 VITALS — BP 145/89 | HR 75 | Resp 15 | Ht 69.0 in | Wt 251.0 lb

## 2021-12-23 DIAGNOSIS — Z8679 Personal history of other diseases of the circulatory system: Secondary | ICD-10-CM

## 2021-12-23 DIAGNOSIS — Z79899 Other long term (current) drug therapy: Secondary | ICD-10-CM

## 2021-12-23 DIAGNOSIS — Z87438 Personal history of other diseases of male genital organs: Secondary | ICD-10-CM

## 2021-12-23 DIAGNOSIS — Z87442 Personal history of urinary calculi: Secondary | ICD-10-CM | POA: Diagnosis not present

## 2021-12-23 DIAGNOSIS — C642 Malignant neoplasm of left kidney, except renal pelvis: Secondary | ICD-10-CM

## 2021-12-23 DIAGNOSIS — M21612 Bunion of left foot: Secondary | ICD-10-CM

## 2021-12-23 DIAGNOSIS — M21611 Bunion of right foot: Secondary | ICD-10-CM | POA: Diagnosis not present

## 2021-12-23 DIAGNOSIS — D869 Sarcoidosis, unspecified: Secondary | ICD-10-CM

## 2021-12-23 DIAGNOSIS — Z862 Personal history of diseases of the blood and blood-forming organs and certain disorders involving the immune mechanism: Secondary | ICD-10-CM

## 2021-12-23 DIAGNOSIS — N1831 Chronic kidney disease, stage 3a: Secondary | ICD-10-CM

## 2021-12-23 NOTE — Telephone Encounter (Signed)
Per Dr. Estanislado Pandy, patient should schedule a follow up appointment with Dr. Chase Caller for repeat PFTs. Last appointment was 03/2020, was due to follow up in 1 year. Patient advised and expressed understanding.

## 2022-04-15 ENCOUNTER — Ambulatory Visit: Payer: BC Managed Care – PPO | Admitting: Internal Medicine

## 2022-04-30 ENCOUNTER — Other Ambulatory Visit: Payer: Self-pay | Admitting: Urology

## 2022-04-30 ENCOUNTER — Encounter (HOSPITAL_BASED_OUTPATIENT_CLINIC_OR_DEPARTMENT_OTHER): Payer: Self-pay | Admitting: Urology

## 2022-04-30 NOTE — Progress Notes (Signed)
RN spoke with patient to go over pre-procedure instructions.  Medications reviewed.  His driver will be his son and caretaker.

## 2022-05-03 ENCOUNTER — Other Ambulatory Visit: Payer: Self-pay

## 2022-05-03 ENCOUNTER — Encounter (HOSPITAL_BASED_OUTPATIENT_CLINIC_OR_DEPARTMENT_OTHER): Payer: Self-pay | Admitting: Urology

## 2022-05-03 ENCOUNTER — Ambulatory Visit (HOSPITAL_BASED_OUTPATIENT_CLINIC_OR_DEPARTMENT_OTHER)
Admission: RE | Admit: 2022-05-03 | Discharge: 2022-05-03 | Disposition: A | Discharge status: Home / Self Care | Payer: BC Managed Care – PPO | Attending: Urology | Admitting: Urology

## 2022-05-03 ENCOUNTER — Ambulatory Visit (HOSPITAL_COMMUNITY): Payer: BC Managed Care – PPO

## 2022-05-03 ENCOUNTER — Encounter (HOSPITAL_BASED_OUTPATIENT_CLINIC_OR_DEPARTMENT_OTHER)
Admission: RE | Disposition: A | Discharge status: Home / Self Care | Payer: Self-pay | Source: Home / Self Care | Attending: Urology

## 2022-05-03 DIAGNOSIS — N200 Calculus of kidney: Secondary | ICD-10-CM

## 2022-05-03 DIAGNOSIS — I10 Essential (primary) hypertension: Secondary | ICD-10-CM | POA: Diagnosis not present

## 2022-05-03 DIAGNOSIS — N201 Calculus of ureter: Secondary | ICD-10-CM | POA: Insufficient documentation

## 2022-05-03 HISTORY — PX: EXTRACORPOREAL SHOCK WAVE LITHOTRIPSY: SHX1557

## 2022-05-03 HISTORY — DX: Malignant (primary) neoplasm, unspecified: C80.1

## 2022-05-03 SURGERY — LITHOTRIPSY, ESWL
Anesthesia: LOCAL | Laterality: Left

## 2022-05-03 MED ORDER — CIPROFLOXACIN HCL 500 MG PO TABS
ORAL_TABLET | ORAL | Status: AC
  Filled 2022-05-03: qty 1

## 2022-05-03 MED ORDER — DIPHENHYDRAMINE HCL 25 MG PO CAPS
ORAL_CAPSULE | ORAL | Status: AC
  Filled 2022-05-03: qty 1

## 2022-05-03 MED ORDER — TRAMADOL HCL 50 MG PO TABS: 50.0000 mg | ORAL_TABLET | Freq: Four times a day (QID) | ORAL | 0 refills | Status: DC | PRN

## 2022-05-03 MED ORDER — CIPROFLOXACIN HCL 500 MG PO TABS
500.0000 mg | ORAL_TABLET | Freq: Once | ORAL | Status: AC
  Administered 2022-05-03: 500 mg via ORAL

## 2022-05-03 MED ORDER — DIAZEPAM 5 MG PO TABS
ORAL_TABLET | ORAL | Status: AC
  Filled 2022-05-03: qty 2

## 2022-05-03 MED ORDER — DIAZEPAM 5 MG PO TABS
10.0000 mg | ORAL_TABLET | Freq: Once | ORAL | Status: AC
  Administered 2022-05-03: 10 mg via ORAL

## 2022-05-03 MED ORDER — DIPHENHYDRAMINE HCL 25 MG PO CAPS
25.0000 mg | ORAL_CAPSULE | Freq: Once | ORAL | Status: AC
  Administered 2022-05-03: 25 mg via ORAL

## 2022-05-03 MED ORDER — SODIUM CHLORIDE 0.9 % IV SOLN: INTRAVENOUS | Status: DC

## 2022-05-03 NOTE — Discharge Instructions (Addendum)
See [facility] discharge instructions in chart.     Post Anesthesia Home Care Instructions  Activity: Get plenty of rest for the remainder of the day. A responsible individual must stay with you for 24 hours following the procedure.  For the next 24 hours, DO NOT: -Drive a car -Paediatric nurse -Drink alcoholic beverages -Take any medication unless instructed by your physician -Make any legal decisions or sign important papers.  Meals: Start with liquid foods such as gelatin or soup. Progress to regular foods as tolerated. Avoid greasy, spicy, heavy foods. If nausea and/or vomiting occur, drink only clear liquids until the nausea and/or vomiting subsides. Call your physician if vomiting continues.  Special Instructions/Symptoms: Your throat may feel dry or sore from the anesthesia or the breathing tube placed in your throat during surgery. If this causes discomfort, gargle with warm salt water. The discomfort should disappear within 24 hours.

## 2022-05-03 NOTE — Interval H&P Note (Signed)
History and Physical Interval Note:  05/03/2022 8:51 AM  Mike Cruz  has presented today for surgery, with the diagnosis of LEFT URETERAL CALCULUS.  The various methods of treatment have been discussed with the patient and family. After consideration of risks, benefits and other options for treatment, the patient has consented to  Procedure(s): LEFT EXTRACORPOREAL SHOCK WAVE LITHOTRIPSY (ESWL) (Left) as a surgical intervention.  The patient's history has been reviewed, patient examined, no change in status, stable for surgery.  I have reviewed the patient's chart and labs.  Questions were answered to the patient's satisfaction.     [name]

## 2022-05-03 NOTE — Interval H&P Note (Signed)
History and Physical Interval Note:  05/03/2022 8:21 AM  Mike Cruz  has presented today for surgery, with the diagnosis of LEFT URETERAL CALCULUS.  The various methods of treatment have been discussed with the patient and family. After consideration of risks, benefits and other options for treatment, the patient has consented to  Procedure(s): LEFT EXTRACORPOREAL SHOCK WAVE LITHOTRIPSY (ESWL) (Left) as a surgical intervention.  The patient's history has been reviewed, patient examined, no change in status, stable for surgery.  I have reviewed the patient's chart and labs.  Questions were answered to the patient's satisfaction.     [name]

## 2022-05-03 NOTE — Op Note (Signed)
See [facility] OP note scanned into chart. Also because of the size, density, location and other factors that cannot be anticipated I feel this will likely be a staged procedure. This fact supersedes any indication in the scanned [facility] operative note to the contrary.  

## 2022-05-03 NOTE — H&P (Signed)
I have kidney stones.  HPI: Mike Cruz is a 56 year-old male established patient who is here for renal calculi.  The problem is on the left side. He first stated noticing pain on approximately 04/25/2022. This is not his first kidney stone. He has had more than 5 stones prior to getting this one. He is currently having flank pain and nausea. He denies having back pain, groin pain, vomiting, fever, and chills. He has not caught a stone in his urine strainer since his symptoms began.   He has had eswl for treatment of his stones in the past.   04/30/2022: He developed left flank _0 /13/2023 01:34 PM  Weight 246 lb / 111.58 kg  Height 69 in / 175.26 cm  BP 131/82 mmHg  Heart Rate 86 /min  Temperature 97.0 F / 36.1 C  BMI 36.3 kg/m   MULTI-SYSTEM PHYSICAL EXAMINATION:    Constitutional: Well-nourished. No physical deformities. Normally developed. Good  grooming.  Neck: Neck symmetrical, not swollen. Normal tracheal position.  Respiratory: No labored breathing, no use of accessory muscles.   Cardiovascular: Normal temperature, normal extremity pulses, no swelling, no varicosities.  Lymphatic: No enlargement of neck, axillae, groin.  Skin: No paleness, no jaundice, no cyanosis. No lesion, no ulcer, no rash.  Neurologic / Psychiatric: Oriented to time, oriented to place, oriented to person. No depression, no anxiety, no agitation.   Gastrointestinal: No mass, no tenderness, no rigidity, non obese abdomen.  Musculoskeletal: Normal gait and station of head and neck.     Complexity of Data:  X-Ray Review: C.T. Abdomen: Reviewed Films. Reviewed Report. Discussed With Patient.     08/27/21 11/07/19 10/30/18 02/07/17  PSA  Total PSA 0.89 ng/mL 1.73 ng/mL 1.79 ng/mL 1.12 ng/mL    PROCEDURES:         KUB - 74018  A single view of the abdomen is obtained.      . Patient confirmed No Neulasta OnPro Device.           Urinalysis Dipstick Dipstick Cont'd  Color: Yellow Bilirubin: Neg mg/dL  Appearance: Clear Ketones: Neg mg/dL  Specific Gravity: 1.025 Blood: Neg ery/uL  pH: 5.5 Protein: Neg mg/dL  Glucose: Neg mg/dL Urobilinogen: 0.2 mg/dL    Nitrites: Neg    Leukocyte Esterase: Neg leu/uL    ASSESSMENT:      ICD-10 Details  1 GU:   Ureteral calculus - N20.1 Left, Acute, Systemic Symptoms   PLAN:           Orders X-Rays: KUB          Schedule         Document Letter(s):  Created for Patient: Clinical Summary         Notes:   Left ureteral calculus:  -We discussed MET, URS and ESWL. AFter discussing the options the patient elects fro ESWL   

## 2022-05-04 ENCOUNTER — Encounter (HOSPITAL_BASED_OUTPATIENT_CLINIC_OR_DEPARTMENT_OTHER): Payer: Self-pay | Admitting: Urology

## 2022-07-01 ENCOUNTER — Other Ambulatory Visit: Payer: Self-pay | Admitting: Internal Medicine

## 2022-07-01 DIAGNOSIS — D869 Sarcoidosis, unspecified: Secondary | ICD-10-CM

## 2022-07-02 ENCOUNTER — Ambulatory Visit: Payer: BC Managed Care – PPO | Admitting: Internal Medicine

## 2022-07-23 ENCOUNTER — Ambulatory Visit: Payer: BC Managed Care – PPO

## 2022-07-23 DIAGNOSIS — D869 Sarcoidosis, unspecified: Secondary | ICD-10-CM

## 2022-07-23 LAB — PULMONARY FUNCTION TEST
DL/VA % pred: 96 %
DL/VA: 4.14 ml/min/mmHg/L
DLCO cor % pred: 91 %
DLCO cor: 24.2 ml/min/mmHg
DLCO unc % pred: 91 %
DLCO unc: 24.2 ml/min/mmHg
FEF 25-75 Pre: 3.17 L/sec
FEF2575-%Pred-Pre: 108 %
FEV1-%Pred-Pre: 76 %
FEV1-Pre: 2.64 L
FEV1FVC-%Pred-Pre: 108 %
FEV6-%Pred-Pre: 73 %
FEV6-Pre: 3.19 L
FEV6FVC-%Pred-Pre: 104 %
FVC-%Pred-Pre: 70 %
FVC-Pre: 3.21 L
Pre FEV1/FVC ratio: 82 %
Pre FEV6/FVC Ratio: 99 %

## 2022-09-07 ENCOUNTER — Other Ambulatory Visit: Payer: Self-pay | Admitting: Gastroenterology

## 2022-09-07 DIAGNOSIS — K862 Cyst of pancreas: Secondary | ICD-10-CM

## 2022-10-01 ENCOUNTER — Ambulatory Visit
Admission: RE | Admit: 2022-10-01 | Discharge: 2022-10-01 | Disposition: A | Discharge status: Home / Self Care | Payer: BC Managed Care – PPO | Source: Ambulatory Visit | Attending: Gastroenterology | Admitting: Gastroenterology

## 2022-10-01 DIAGNOSIS — K862 Cyst of pancreas: Secondary | ICD-10-CM

## 2022-10-01 MED ORDER — GADOPICLENOL 0.5 MMOL/ML IV SOLN
10.0000 mL | Freq: Once | INTRAVENOUS | Status: AC | PRN
  Administered 2022-10-01: 10 mL via INTRAVENOUS

## 2022-11-10 ENCOUNTER — Other Ambulatory Visit: Payer: Self-pay | Admitting: Urology

## 2022-11-15 ENCOUNTER — Other Ambulatory Visit: Payer: Self-pay | Admitting: Urology

## 2022-12-08 NOTE — Progress Notes (Signed)
Office Visit Note  Patient: Mike Cruz             Date of Birth: [birth year]           MRN: [id number]             PCP: Marden Noble, MD (Inactive) Referring: Marden Noble, MD Visit Date: 12/22/2022 Occupation: @GUAROCC @  Subjective:  Sarcoidosis  History of Present Illness: Mike Cruz is a 57 y.o. male with sarcoidosis.  He denies any shortness of breath or lymphadenopathy.  He denies any joint pain or joint swelling.  He has not seen Dr. Colletta [place] since the last visit in 2021.  He had PFTs on July 23, 2022 which showed normal DLCO and mild restriction.  He was diagnosed with renal cell carcinoma recurrence.  He is a scheduled to have partial nephrectomy next Wednesday.  He has history of recurrent renal calculus.  He had lithotripsy on May 03, 2022.  He had no recurrence of renal calcinosis since then.    Activities of Daily Living:  Patient reports morning stiffness for 0 minutes.   Patient Denies nocturnal pain.  Difficulty dressing/grooming: Denies Difficulty climbing stairs: Denies Difficulty getting out of chair: Denies Difficulty using hands for taps, buttons, cutlery, and/or writing: Denies  Review of Systems  Constitutional:  Negative for fatigue.  HENT:  Negative for mouth sores and mouth dryness.   Eyes:  Negative for dryness.  Respiratory:  Negative for shortness of breath.   Cardiovascular:  Negative for chest pain and palpitations.  Gastrointestinal:  Negative for blood in stool, constipation and diarrhea.  Endocrine: Negative for increased urination.  Genitourinary:  Negative for involuntary urination.  Musculoskeletal:  Negative for joint pain, gait problem, joint pain, joint swelling, myalgias, muscle weakness, morning stiffness, muscle tenderness and myalgias.  Skin:  Negative for color change, rash and sensitivity to sunlight.  Allergic/Immunologic: Negative for susceptible to infections.  Neurological:  Negative for dizziness and headaches.   Hematological:  Negative for swollen glands.  Psychiatric/Behavioral:  Positive for sleep disturbance. Negative for depressed mood. The patient is not nervous/anxious.     PMFS History:  Patient Active Problem List   Diagnosis Date Noted   Genetic testing 12/07/2017   Family history of kidney cancer    Family history of ovarian cancer    Family history of prostate cancer    Family history of stomach cancer    Family history of colon cancer    Renal cell carcinoma (HCC) 11/03/2017   Renal mass 04/20/2017   History of hypertension 12/31/2016   History of anemia 12/31/2016   High risk medication use 12/31/2016   History of renal calculi 12/31/2016   History of prostatitis 12/31/2016   On prednisone therapy 12/31/2016   Abnormal serum angiotensin-converting enzyme level 12/31/2016   Acute kidney injury superimposed on chronic kidney disease (HCC) 11/22/2016   History of viral pneumonia 11/22/2016   Anemia 09/27/2016   Sarcoidosis    Hypertension     Past Medical History:  Diagnosis Date   Acute renal failure (ARF) (HCC)    Acute respiratory failure (HCC)    Anemia    Cancer (HCC)    Cancer of kidney, left (HCC) 01/2021   Family history of colon cancer    Family history of kidney cancer    Family history of ovarian cancer    Family history of prostate cancer    Family history of stomach cancer    History of kidney stones    Hypertension  Hyponatremia    Kidney stone    kidney mass 04/13/17   Pneumonia    Sarcoidosis     Family History  Problem Relation Age of Onset   Ovarian cancer Mother 79   AAA (abdominal aortic aneurysm) Father    Prostate cancer Father 78   Kidney cancer Father 23       was 2 separate primaries, not a met   Stroke Sister 21   Healthy Daughter    Healthy Daughter    Healthy Son    Lung cancer Maternal Aunt    Stomach cancer Maternal Grandmother 24   Colon cancer Maternal Grandmother 25   Prostate cancer Maternal Grandfather 80        metastatic   Lymphoma Cousin    Prostate cancer Cousin 85   Past Surgical History:  Procedure Laterality Date   EXTRACORPOREAL SHOCK WAVE LITHOTRIPSY Left 05/03/2022   Procedure: LEFT EXTRACORPOREAL SHOCK WAVE LITHOTRIPSY (ESWL);  Surgeon: Crist Fat, MD;  Location: [facility];  Service: Urology;  Laterality: Left;   HEAD & NECK SKIN LESION EXCISIONAL BIOPSY     KNEE SURGERY Left    Arthroscopic   partial nephrectomt     Left 04/20/17 Dr. Berneice Heinrich   ROBOTIC ASSITED PARTIAL NEPHRECTOMY Left 04/20/2017   Procedure: XI ROBOTIC ASSITED PARTIAL NEPHRECTOMY;  Surgeon: Sebastian Ache, MD;  Location: [facility];  Service: Urology;  Laterality: Left;   Social History   Social History Narrative   Not on file   Immunization History  Administered Date(s) Administered   Influenza,inj,Quad PF,6+ Mos 04/18/2016, 04/21/2017, 03/22/2018   Influenza-Unspecified 04/04/2019   Moderna SARS-COV2 Booster Vaccination 05/15/2020, 01/07/2021   Moderna Sars-Covid-2 Vaccination 08/30/2019, 10/02/2019     Objective: Vital Signs: BP 126/84 (BP Location: Left Arm, Patient Position: Sitting, Cuff Size: Large)   Pulse 60   Resp 17   Ht 5\' 9"  (1.753 m)   Wt 251 lb 6.4 oz (114 kg)   BMI 37.13 kg/m    Physical Exam Vitals and nursing note reviewed.  Constitutional:      Appearance: He is well-developed.  HENT:     Head: Normocephalic and atraumatic.  Eyes:     Conjunctiva/sclera: Conjunctivae normal.     Pupils: Pupils are equal, round, and reactive to light.  Cardiovascular:     Rate and Rhythm: Normal rate and regular rhythm.     Heart sounds: Normal heart sounds.  Pulmonary:     Effort: Pulmonary effort is normal.     Breath sounds: Normal breath sounds.  Abdominal:     General: Bowel sounds are normal.     Palpations: Abdomen is soft.  Musculoskeletal:     Cervical back: Normal range of motion and neck supple.  Skin:    General: Skin is warm and dry.     Capillary Refill:  Capillary refill takes less than 2 seconds.  Neurological:     Mental Status: He is alert and oriented to person, place, and time.  Psychiatric:        Behavior: Behavior normal.      Musculoskeletal Exam: Cervical spine was in good range of motion.  Shoulder joints, elbow joints, wrist joints, MCPs PIPs and DIPs been good range of motion with no synovitis.  Hip joints and knee joints were in good range of motion.  There was no tenderness over ankles or MTPs.  CDAI Exam: CDAI Score: -- Patient Global: --; Provider Global: -- Swollen: --; Tender: -- Joint Exam 12/22/2022  No joint exam has been documented for this visit   There is currently no information documented on the homunculus. Go to the Rheumatology activity and complete the homunculus joint exam.  Investigation: No additional findings.  Imaging: No results found.  Recent Labs: Lab Results  Component Value Date   WBC 7.5 12/15/2022   HGB 13.2 12/15/2022   PLT 159 12/15/2022   NA 140 12/15/2022   K 3.7 12/15/2022   CL 107 12/15/2022   CO2 25 12/15/2022   GLUCOSE 113 (H) 12/15/2022   BUN 26 (H) 12/15/2022   CREATININE 1.96 (H) 12/15/2022   BILITOT 0.4 09/27/2018   ALKPHOS 64 02/25/2017   AST 36 (H) 09/27/2018   ALT 36 09/27/2018   PROT 7.6 09/27/2018   ALBUMIN 4.3 02/25/2017   CALCIUM 9.5 12/15/2022   GFRAA 41 (L) 09/27/2018    Speciality Comments: No specialty comments available.  Procedures:  No procedures performed Allergies: Penicillins   Assessment / Plan:     Visit Diagnoses: Sarcoidosis - Longstanding history of sarcoidosis-dx in 2017, CT 03/11/16: diffuse adenopathy in mediastinal and abdominal areas, LN biopsy 03/12/16-granulomatous lymphadenitis.  No synovitis was noted on the examination.  No lymphadenopathy was noted.  Patient denies any history of shortness of breath, palpitations, inflammatory arthritis.  Patient has not seen Dr. Colletta [place] since 2021.  He had recent PFTs which was stable.  I  advised him to schedule an appointment with Dr. Colletta [place].  Last chest x-ray was from November 14, 2019 which was unremarkable.  High risk medication use - d/c MTX due to elevated creatinine and AST in March 2020.  I reviewed most recent labs from March 2020 which showed AST mildly elevated at 36.  GFR was 41.  Dec 15, 2022 creatinine was 1.96.  Bilateral bunions-currently not symptomatic.  Renal cell carcinoma of left kidney (HCC) - patient had partial nephrectomy in 2018.  He has recurrence of renal cancer.  He is a scheduled to have partial nephrectomy again next week.  History of renal calculi-last lithotripsy was in November 2023.  He had no recurrence of renal calculi since then.  Chronic kidney disease (CKD) stage G3a - Patient has been followed at [facility].  According to him his renal function has been stable.  History of hypertension-blood pressure was normal at 126/84.  History of anemia  History of prostatitis  Orders: No orders of the defined types were placed in this encounter.  No orders of the defined types were placed in this encounter.    Follow-Up Instructions: Return in about 1 year (around 12/22/2023) for Sarcoidosis.   Pollyann Savoy, MD  Note - This record has been created using Animal nutritionist.  Chart creation errors have been sought, but may not always  have been located. Such creation errors do not reflect on  the standard of medical care.

## 2022-12-14 NOTE — Patient Instructions (Signed)
SURGICAL WAITING ROOM VISITATION  Patients having surgery or a procedure may have no more than 2 support people in the waiting area - these visitors may rotate.    Children under the age of 47 must have an adult with them who is not the patient.  Due to an increase in RSV and influenza rates and associated hospitalizations, children ages 21 and under may not visit patients in [facility] hospitals.  If the patient needs to stay at the hospital during part of their recovery, the visitor guidelines for inpatient rooms apply. Pre-op nurse will coordinate an appropriate time for 1 support person to accompany patient in pre-op.  This support person may not rotate.    Please refer to the [facility] website for the visitor guidelines for Inpatients (after your surgery is over and you are in a regular room).    Your procedure is scheduled on: 12/29/22   Report to [facility] Main Entrance    Report to admitting at 6:15 AM   Call this number if you have problems the morning of surgery [phone]   Follow a clear liquid diet the day before surgery.  Water Non-Citrus Juices (without pulp, NO RED-Apple, White grape, White cranberry) Black Coffee (NO MILK/CREAM OR CREAMERS, sugar ok)  Clear Tea (NO MILK/CREAM OR CREAMERS, sugar ok) regular and decaf                             Plain Jell-O (NO RED)                                           Fruit ices (not with fruit pulp, NO RED)                                     Popsicles (NO RED)                                                               Sports drinks like Gatorade (NO RED)  After midnight nothing to drink.                      If you have questions, please contact your surgeon's office.   FOLLOW BOWEL PREP AND ANY ADDITIONAL PRE OP INSTRUCTIONS YOU RECEIVED FROM YOUR SURGEON'S OFFICE!!!     Oral Hygiene is also important to reduce your risk of infection.                                    Remember - BRUSH YOUR TEETH THE  MORNING OF SURGERY WITH YOUR REGULAR TOOTHPASTE  DENTURES WILL BE REMOVED PRIOR TO SURGERY PLEASE DO NOT APPLY "Poly grip" OR ADHESIVES!!!   Take these medicines the morning of surgery with A SIP OF WATER: None    Hold Farxiga for 3 days prior to surgery. Last dose 12/25/22.  You may not have any metal on your body including jewelry, and body piercing             Do not wear lotions, powders, cologne, or deodorant              Men may shave face and neck.   Do not bring valuables to the hospital. [facility] IS NOT             RESPONSIBLE   FOR VALUABLES.   Contacts, glasses, dentures or bridgework may not be worn into surgery.   Bring small overnight bag day of surgery.   DO NOT BRING YOUR HOME MEDICATIONS TO THE HOSPITAL. PHARMACY WILL DISPENSE MEDICATIONS LISTED ON YOUR MEDICATION LIST TO YOU DURING YOUR ADMISSION IN THE HOSPITAL!              Please read over the following fact sheets you were given: IF YOU HAVE QUESTIONS ABOUT YOUR PRE-OP INSTRUCTIONS PLEASE CALL [name]    If you received a COVID test during your pre-op visit  it is requested that you wear a mask when out in public, stay away from anyone that may not be feeling well and notify your surgeon if you develop symptoms. If you test positive for Covid or have been in contact with anyone that has tested positive in the last 10 days please notify you surgeon.    [facility] - Preparing for Surgery Before surgery, you can play an important role.  Because skin is not sterile, your skin needs to be as free of germs as possible.  You can reduce the number of germs on your skin by washing with CHG (chlorahexidine gluconate) soap before surgery.  CHG is an antiseptic cleaner which kills germs and bonds with the skin to continue killing germs even after washing. Please DO NOT use if you have an allergy to CHG or antibacterial soaps.  If your skin becomes reddened/irritated stop using the  CHG and inform your nurse when you arrive at Short Stay. Do not shave (including legs and underarms) for at least 48 hours prior to the first CHG shower.  You may shave your face/neck.  Please follow these instructions carefully:  1.  Shower with CHG Soap the night before surgery and the  morning of surgery.  2.  If you choose to wash your hair, wash your hair first as usual with your normal  shampoo.  3.  After you shampoo, rinse your hair and body thoroughly to remove the shampoo.                             4.  Use CHG as you would any other liquid soap.  You can apply chg directly to the skin and wash.  Gently with a scrungie or clean washcloth.  5.  Apply the CHG Soap to your body ONLY FROM THE NECK DOWN.   Do   not use on face/ open                           Wound or open sores. Avoid contact with eyes, ears mouth and   genitals (private parts).                       Wash face,  Genitals (private parts) with your normal soap.  6.  Wash thoroughly, paying special attention to the area where your    surgery  will be performed.  7.  Thoroughly rinse your body with warm water from the neck down.  8.  DO NOT shower/wash with your normal soap after using and rinsing off the CHG Soap.                9.  Pat yourself dry with a clean towel.            10.  Wear clean pajamas.            11.  Place clean sheets on your bed the night of your first shower and do not  sleep with pets. Day of Surgery : Do not apply any lotions/deodorants the morning of surgery.  Please wear clean clothes to the hospital/surgery center.  FAILURE TO FOLLOW THESE INSTRUCTIONS MAY RESULT IN THE CANCELLATION OF YOUR SURGERY  PATIENT SIGNATURE_________________________________  NURSE SIGNATURE__________________________________  ________________________________________________________________________  WHAT IS A BLOOD TRANSFUSION? Blood Transfusion Information  A transfusion is the replacement of blood or  some of its parts. Blood is made up of multiple cells which provide different functions. Red blood cells carry oxygen and are used for blood loss replacement. White blood cells fight against infection. Platelets control bleeding. Plasma helps clot blood. Other blood products are available for specialized needs, such as hemophilia or other clotting disorders. BEFORE THE TRANSFUSION  Who gives blood for transfusions?  Healthy volunteers who are fully evaluated to make sure their blood is safe. This is blood bank blood. Transfusion therapy is the safest it has ever been in the practice of medicine. Before blood is taken from a donor, a complete history is taken to make sure that person has no history of diseases nor engages in risky social behavior (examples are intravenous drug use or sexual activity with multiple partners). The donor's travel history is screened to minimize risk of transmitting infections, such as malaria. The donated blood is tested for signs of infectious diseases, such as HIV and hepatitis. The blood is then tested to be sure it is compatible with you in order to minimize the chance of a transfusion reaction. If you or a relative donates blood, this is often done in anticipation of surgery and is not appropriate for emergency situations. It takes many days to process the donated blood. RISKS AND COMPLICATIONS Although transfusion therapy is very safe and saves many lives, the main dangers of transfusion include:  Getting an infectious disease. Developing a transfusion reaction. This is an allergic reaction to something in the blood you were given. Every precaution is taken to prevent this. The decision to have a blood transfusion has been considered carefully by your caregiver before blood is given. Blood is not given unless the benefits outweigh the risks. AFTER THE TRANSFUSION Right after receiving a blood transfusion, you will usually feel much better and more energetic. This is  especially true if your red blood cells have gotten low (anemic). The transfusion raises the level of the red blood cells which carry oxygen, and this usually causes an energy increase. The nurse administering the transfusion will monitor you carefully for complications. HOME CARE INSTRUCTIONS  No special instructions are needed after a transfusion. You may find your energy is better. Speak with your caregiver about any limitations on activity for underlying diseases you may have. SEEK MEDICAL CARE IF:  Your condition is not improving after your transfusion. You develop redness or irritation at the  intravenous (IV) site. SEEK IMMEDIATE MEDICAL CARE IF:  Any of the following symptoms occur over the next 12 hours: Shaking chills. You have a temperature by mouth above 102 F (38.9 C), not controlled by medicine. Chest, back, or muscle pain. People around you feel you are not acting correctly or are confused. Shortness of breath or difficulty breathing. Dizziness and fainting. You get a rash or develop hives. You have a decrease in urine output. Your urine turns a dark color or changes to pink, red, or brown. Any of the following symptoms occur over the next 10 days: You have a temperature by mouth above 102 F (38.9 C), not controlled by medicine. Shortness of breath. Weakness after normal activity. The white part of the eye turns yellow (jaundice). You have a decrease in the amount of urine or are urinating less often. Your urine turns a dark color or changes to pink, red, or brown. Document Released: 07/02/2000 Document Revised: 09/27/2011 Document Reviewed: 02/19/2008 [facility] Patient Information 2014 [place], [place].  _______________________________________________________________________

## 2022-12-14 NOTE — Progress Notes (Signed)
COVID Vaccine Completed: yes  Date of COVID positive in last 90 days:  PCP - Marden Noble, MD Cardiologist - n/a  Chest x-ray - n/a EKG - 12/15/22 Epic/chart Stress Test - n/a ECHO - 10/01/16 Epic Cardiac Cath - n/a Pacemaker/ICD device last checked: n/a Spinal Cord Stimulator: n/a  Bowel Prep - clears day before and magnesium citrate. Patient aware  Sleep Study - n/a CPAP -   Fasting Blood Sugar - n/a Checks Blood Sugar _____ times a day  Last dose of GLP1 agonist-  N/A GLP1 instructions:  N/A   Last dose of SGLT-2 inhibitors-  N/A SGLT-2 instructions: N/A   Blood Thinner Instructions:  n/a Aspirin Instructions: Last Dose:  Activity level: Can go up a flight of stairs and perform activities of daily living without stopping and without symptoms of chest pain or shortness of breath.   Anesthesia review: STOPBANG 5, creatinine 1.96  Patient denies shortness of breath, fever, cough and chest pain at PAT appointment  Patient verbalized understanding of instructions that were given to them at the PAT appointment. Patient was also instructed that they will need to review over the PAT instructions again at home before surgery.

## 2022-12-15 ENCOUNTER — Other Ambulatory Visit: Payer: Self-pay

## 2022-12-15 ENCOUNTER — Encounter (HOSPITAL_COMMUNITY): Payer: Self-pay

## 2022-12-15 ENCOUNTER — Encounter (HOSPITAL_COMMUNITY)
Admission: RE | Admit: 2022-12-15 | Discharge: 2022-12-15 | Disposition: A | Discharge status: Home / Self Care | Payer: BC Managed Care – PPO | Source: Ambulatory Visit | Attending: Urology | Admitting: Urology

## 2022-12-15 VITALS — BP 142/94 | HR 60 | Temp 98.2°F | Resp 16 | Ht 69.0 in | Wt 250.3 lb

## 2022-12-15 DIAGNOSIS — Z01818 Encounter for other preprocedural examination: Secondary | ICD-10-CM | POA: Insufficient documentation

## 2022-12-15 DIAGNOSIS — I10 Essential (primary) hypertension: Secondary | ICD-10-CM | POA: Insufficient documentation

## 2022-12-15 LAB — CBC
HCT: 40.8 % (ref 39.0–52.0)
Hemoglobin: 13.2 g/dL (ref 13.0–17.0)
MCH: 29.2 pg (ref 26.0–34.0)
MCHC: 32.4 g/dL (ref 30.0–36.0)
MCV: 90.3 fL (ref 80.0–100.0)
Platelets: 159 10*3/uL (ref 150–400)
RBC: 4.52 MIL/uL (ref 4.22–5.81)
RDW: 12.6 % (ref 11.5–15.5)
WBC: 7.5 10*3/uL (ref 4.0–10.5)
nRBC: 0 % (ref 0.0–0.2)

## 2022-12-15 LAB — BASIC METABOLIC PANEL
Anion gap: 8 (ref 5–15)
BUN: 26 mg/dL — ABNORMAL HIGH (ref 6–20)
CO2: 25 mmol/L (ref 22–32)
Calcium: 9.5 mg/dL (ref 8.9–10.3)
Chloride: 107 mmol/L (ref 98–111)
Creatinine, Ser: 1.96 mg/dL — ABNORMAL HIGH (ref 0.61–1.24)
GFR, Estimated: 39 mL/min — ABNORMAL LOW (ref >60)
Glucose, Bld: 113 mg/dL — ABNORMAL HIGH (ref 70–99)
Potassium: 3.7 mmol/L (ref 3.5–5.1)
Sodium: 140 mmol/L (ref 135–145)

## 2022-12-15 LAB — TYPE AND SCREEN
ABO/RH(D): B POS
Antibody Screen: NEGATIVE

## 2022-12-15 NOTE — Progress Notes (Signed)
   12/15/22 0829  OBSTRUCTIVE SLEEP APNEA  Have you ever been diagnosed with sleep apnea through a sleep study? No  Do you snore loudly (loud enough to be heard through closed doors)?  1  Do you often feel tired, fatigued, or sleepy during the daytime (such as falling asleep during driving or talking to someone)? 0  Has anyone observed you stop breathing during your sleep? 0  Do you have, or are you being treated for high blood pressure? 1  BMI more than 35 kg/m2? 1  Age > 50 (1-yes) 1  Neck circumference greater than:Male 16 inches or larger, Male 17inches or larger? 0  Male Gender (Yes=1) 1  Obstructive Sleep Apnea Score 5

## 2022-12-15 NOTE — Progress Notes (Signed)
Creatinine 1.96, results routed to Dr. Berneice Heinrich

## 2022-12-21 NOTE — Progress Notes (Signed)
Case: [id number] Date/Time: 12/29/22 0815   Procedure: XI ROBOTIC ASSITED PARTIAL NEPHRECTOMY (Left) - 3HRS   Anesthesia type: General   Pre-op diagnosis: LEFT RENAL MASS   Location: [facility] / [facility]   Surgeons: Loletta Parish., MD       DISCUSSION: Mike Cruz is a 57 yo male who presents to PAT prior to surgery listed above. Pt with hx of left sided RCC s/p partial nephrectomy in 2018. Has now had recurrence of cancer and is scheduled for total nephrectomy. Patient also with hx of CKD stage 3 followed by Nephrology, hx of kidney stones, sarcoidosis followed by Rheumatology and Pulmonology.  No prior anesthesia complications  Last saw Rheumatology ***  Last saw  Pulmonology on 03/27/20. He was noted to be doing well and "essentially asymptomatic from a respiratory standpoint". Advised to f/u in 1 year but appears has been lost to follow up.    VS: BP (!) 142/94   Pulse 60   Temp 36.8 C (Oral)   Resp 16   Ht 5\' 9"  (1.753 m)   Wt 113.5 kg   SpO2 99%   BMI 36.96 kg/m   PROVIDERS: Marden Noble, MD (Inactive) Rheumatology: Susa Loffler, MD Pulmonology: Kalman Shan, MD Nephrology: Dr. Bufford Buttner  LABS: Labs reviewed: Acceptable for surgery. SCr is 1.96 which is at baseline for his CKD. He is followed by Nephrology (all labs ordered are listed, but only abnormal results are displayed)  Labs Reviewed  BASIC METABOLIC PANEL - Abnormal; Notable for the following components:      Result Value   Glucose, Bld 113 (*)    BUN 26 (*)    Creatinine, Ser 1.96 (*)    GFR, Estimated 39 (*)    All other components within normal limits  CBC  TYPE AND SCREEN     IMAGES:   MRI 10/01/22: IMPRESSION: 1. Similar size of the multifocal cystic pancreatic lesions measuring up to 18 mm, without suspicious postcontrast enhancement or wall/septal thickening. 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 Recommend follow up pre and post contrast MRI/MRCP in 1 year. 2.  Stable enhancing left upper pole renal mass measuring 18 mm, consistent with a renal cell carcinoma. 3. Surgical changes of prior partial left nephrectomy. 4. No evidence of metastatic disease in the abdomen.  CXR 11/14/19:  FINDINGS: The heart size and mediastinal contours are within normal limits. Both lungs are clear. The visualized skeletal structures are unremarkable.   IMPRESSION: Normal study.  EKG 12/15/22:  Sinus bradycardia, rate 58   CV:  Echo 10/01/2016:  Study Conclusions   - Left ventricle: The cavity size was normal. Wall thickness was    normal. Systolic function was normal. The estimated ejection    fraction was in the range of 55% to 60%. Wall motion was normal;    there were no regional wall motion abnormalities. Doppler    parameters are consistent with abnormal left ventricular    relaxation (grade 1 diastolic dysfunction).  - Aortic valve: There was no stenosis.  - Mitral valve: There was no significant regurgitation.  - Right ventricle: The cavity size was normal. Systolic function    was normal.  - Pulmonary arteries: No complete TR doppler jet so unable to    estimate PA systolic pressure.  - Inferior vena cava: The vessel was normal in size. The    respirophasic diameter changes were in the normal range (>= 50%),    consistent with normal central venous pressure.  Impressions:   - Normal LV size with EF 55-60%. Normal RV size and systolic    function. No significant valvular abnormalities.   Past Medical History:  Diagnosis Date   Acute renal failure (ARF) (HCC)    Acute respiratory failure (HCC)    Anemia    Cancer (HCC)    Cancer of kidney, left (HCC) 01/2021   Family history of colon cancer    Family history of kidney cancer    Family history of ovarian cancer    Family history of prostate cancer    Family history of stomach cancer    History of kidney stones    Hypertension    Hyponatremia    Kidney stone    kidney mass  04/13/17   Pneumonia    Sarcoidosis     Past Surgical History:  Procedure Laterality Date   EXTRACORPOREAL SHOCK WAVE LITHOTRIPSY Left 05/03/2022   Procedure: LEFT EXTRACORPOREAL SHOCK WAVE LITHOTRIPSY (ESWL);  Surgeon: Crist Fat, MD;  Location: [facility];  Service: Urology;  Laterality: Left;   HEAD & NECK SKIN LESION EXCISIONAL BIOPSY     KNEE SURGERY Left    Arthroscopic   partial nephrectomt     Left 04/20/17 Dr. Berneice Heinrich   ROBOTIC ASSITED PARTIAL NEPHRECTOMY Left 04/20/2017   Procedure: XI ROBOTIC ASSITED PARTIAL NEPHRECTOMY;  Surgeon: Sebastian Ache, MD;  Location: [facility];  Service: Urology;  Laterality: Left;    MEDICATIONS:  FARXIGA 10 MG TABS tablet   Multiple Vitamins-Minerals (MULTIVITAMIN ADULTS) TABS   Olmesartan-Amlodipine-HCTZ 40-5-12.5 MG TABS   traMADol (ULTRAM) 50 MG tablet   No current facility-administered medications for this encounter.

## 2022-12-22 ENCOUNTER — Encounter: Payer: Self-pay | Admitting: Rheumatology

## 2022-12-22 ENCOUNTER — Ambulatory Visit: Payer: BC Managed Care – PPO | Attending: Rheumatology | Admitting: Rheumatology

## 2022-12-22 VITALS — BP 126/84 | HR 60 | Resp 17 | Ht 69.0 in | Wt 251.4 lb

## 2022-12-22 DIAGNOSIS — D869 Sarcoidosis, unspecified: Secondary | ICD-10-CM | POA: Diagnosis not present

## 2022-12-22 DIAGNOSIS — Z87438 Personal history of other diseases of male genital organs: Secondary | ICD-10-CM

## 2022-12-22 DIAGNOSIS — Z79899 Other long term (current) drug therapy: Secondary | ICD-10-CM

## 2022-12-22 DIAGNOSIS — Z8679 Personal history of other diseases of the circulatory system: Secondary | ICD-10-CM

## 2022-12-22 DIAGNOSIS — Z862 Personal history of diseases of the blood and blood-forming organs and certain disorders involving the immune mechanism: Secondary | ICD-10-CM

## 2022-12-22 DIAGNOSIS — C642 Malignant neoplasm of left kidney, except renal pelvis: Secondary | ICD-10-CM

## 2022-12-22 DIAGNOSIS — N1831 Chronic kidney disease, stage 3a: Secondary | ICD-10-CM

## 2022-12-22 DIAGNOSIS — M21612 Bunion of left foot: Secondary | ICD-10-CM

## 2022-12-22 DIAGNOSIS — M21611 Bunion of right foot: Secondary | ICD-10-CM

## 2022-12-22 DIAGNOSIS — Z87442 Personal history of urinary calculi: Secondary | ICD-10-CM

## 2022-12-22 NOTE — Anesthesia Preprocedure Evaluation (Addendum)
Anesthesia Evaluation  Patient identified by MRN, date of birth, ID band Patient awake    Reviewed: Allergy & Precautions, H&P , NPO status , Patient's Chart, lab work & pertinent test results  Airway Mallampati: II  TM Distance: >3 FB Neck ROM: Full    Dental no notable dental hx. (+) Dental Advisory Given, Teeth Intact   Pulmonary neg pulmonary ROS   Pulmonary exam normal breath sounds clear to auscultation       Cardiovascular hypertension, Pt. on medications Normal cardiovascular exam Rhythm:Regular Rate:Normal     Neuro/Psych negative neurological ROS  negative psych ROS   GI/Hepatic negative GI ROS, Neg liver ROS,,,  Endo/Other  negative endocrine ROS    Renal/GU Renal disease  negative genitourinary   Musculoskeletal negative musculoskeletal ROS (+)    Abdominal  (+) + obese  Peds negative pediatric ROS (+)  Hematology negative hematology ROS (+)   Anesthesia Other Findings Renal Cancer  Reproductive/Obstetrics negative OB ROS                              Anesthesia Physical Anesthesia Plan  ASA: 3  Anesthesia Plan: General   Post-op Pain Management: Dilaudid IV   Induction: Intravenous  PONV Risk Score and Plan: 2 and Ondansetron, Midazolam and Treatment may vary due to age or medical condition  Airway Management Planned: Oral ETT  Additional Equipment:   Intra-op Plan:   Post-operative Plan: Extubation in OR  Informed Consent: I have reviewed the patients History and Physical, chart, labs and discussed the procedure including the risks, benefits and alternatives for the proposed anesthesia with the patient or authorized representative who has indicated his/her understanding and acceptance.     Dental advisory given  Plan Discussed with: CRNA  Anesthesia Plan Comments: (See PAT note from 5/29 by Sherlie Ban PA-C )         Anesthesia Quick Evaluation

## 2022-12-29 ENCOUNTER — Other Ambulatory Visit: Payer: Self-pay

## 2022-12-29 ENCOUNTER — Inpatient Hospital Stay (HOSPITAL_COMMUNITY)
Admission: RE | Admit: 2022-12-29 | Discharge: 2022-12-31 | DRG: 661 | Disposition: A | Discharge status: Home / Self Care | Payer: BC Managed Care – PPO | Source: Ambulatory Visit | Attending: Urology | Admitting: Urology

## 2022-12-29 ENCOUNTER — Encounter (HOSPITAL_COMMUNITY): Payer: Self-pay | Admitting: Urology

## 2022-12-29 ENCOUNTER — Encounter (HOSPITAL_COMMUNITY)
Admission: RE | Disposition: A | Discharge status: Home / Self Care | Payer: Self-pay | Source: Ambulatory Visit | Attending: Urology

## 2022-12-29 ENCOUNTER — Ambulatory Visit (HOSPITAL_COMMUNITY): Payer: BC Managed Care – PPO | Admitting: Medical

## 2022-12-29 ENCOUNTER — Ambulatory Visit (HOSPITAL_COMMUNITY): Payer: BC Managed Care – PPO | Admitting: Anesthesiology

## 2022-12-29 DIAGNOSIS — E669 Obesity, unspecified: Secondary | ICD-10-CM | POA: Diagnosis present

## 2022-12-29 DIAGNOSIS — Z807 Family history of other malignant neoplasms of lymphoid, hematopoietic and related tissues: Secondary | ICD-10-CM

## 2022-12-29 DIAGNOSIS — Z801 Family history of malignant neoplasm of trachea, bronchus and lung: Secondary | ICD-10-CM

## 2022-12-29 DIAGNOSIS — Z8051 Family history of malignant neoplasm of kidney: Secondary | ICD-10-CM

## 2022-12-29 DIAGNOSIS — C642 Malignant neoplasm of left kidney, except renal pelvis: Principal | ICD-10-CM | POA: Diagnosis present

## 2022-12-29 DIAGNOSIS — Z823 Family history of stroke: Secondary | ICD-10-CM

## 2022-12-29 DIAGNOSIS — Z8041 Family history of malignant neoplasm of ovary: Secondary | ICD-10-CM

## 2022-12-29 DIAGNOSIS — N2889 Other specified disorders of kidney and ureter: Principal | ICD-10-CM | POA: Diagnosis present

## 2022-12-29 DIAGNOSIS — Z800 Family history of malignant neoplasm of digestive organs: Secondary | ICD-10-CM

## 2022-12-29 DIAGNOSIS — Z6838 Body mass index (BMI) 38.0-38.9, adult: Secondary | ICD-10-CM

## 2022-12-29 DIAGNOSIS — N179 Acute kidney failure, unspecified: Secondary | ICD-10-CM | POA: Diagnosis present

## 2022-12-29 DIAGNOSIS — I10 Essential (primary) hypertension: Secondary | ICD-10-CM | POA: Diagnosis present

## 2022-12-29 DIAGNOSIS — Z880 Allergy status to penicillin: Secondary | ICD-10-CM

## 2022-12-29 DIAGNOSIS — Z8042 Family history of malignant neoplasm of prostate: Secondary | ICD-10-CM

## 2022-12-29 HISTORY — PX: ROBOTIC ASSITED PARTIAL NEPHRECTOMY: SHX6087

## 2022-12-29 LAB — HEMOGLOBIN AND HEMATOCRIT, BLOOD
HCT: 40.7 % (ref 39.0–52.0)
Hemoglobin: 13 g/dL (ref 13.0–17.0)

## 2022-12-29 SURGERY — NEPHRECTOMY, PARTIAL, ROBOT-ASSISTED
Anesthesia: General | Site: Abdomen | Laterality: Left

## 2022-12-29 MED ORDER — ONDANSETRON HCL 4 MG/2ML IJ SOLN
INTRAMUSCULAR | Status: AC
  Filled 2022-12-29: qty 2

## 2022-12-29 MED ORDER — CEFAZOLIN SODIUM-DEXTROSE 2-4 GM/100ML-% IV SOLN
2.0000 g | Freq: Once | INTRAVENOUS | Status: AC
  Administered 2022-12-29: 2 g via INTRAVENOUS
  Filled 2022-12-29: qty 100

## 2022-12-29 MED ORDER — DEXAMETHASONE SODIUM PHOSPHATE 10 MG/ML IJ SOLN
INTRAMUSCULAR | Status: AC
  Filled 2022-12-29: qty 1

## 2022-12-29 MED ORDER — CHLORHEXIDINE GLUCONATE 0.12 % MT SOLN
15.0000 mL | Freq: Once | OROMUCOSAL | Status: AC
  Administered 2022-12-29: 15 mL via OROMUCOSAL

## 2022-12-29 MED ORDER — MIDAZOLAM HCL 5 MG/5ML IJ SOLN
INTRAMUSCULAR | Status: DC | PRN
  Administered 2022-12-29: 2 mg via INTRAVENOUS

## 2022-12-29 MED ORDER — BUPIVACAINE LIPOSOME 1.3 % IJ SUSP
INTRAMUSCULAR | Status: DC | PRN
  Administered 2022-12-29: 20 mL

## 2022-12-29 MED ORDER — ONDANSETRON HCL 4 MG/2ML IJ SOLN
4.0000 mg | INTRAMUSCULAR | Status: DC | PRN
  Administered 2022-12-29: 4 mg via INTRAVENOUS
  Filled 2022-12-29: qty 2

## 2022-12-29 MED ORDER — HYOSCYAMINE SULFATE 0.125 MG SL SUBL: 0.1250 mg | SUBLINGUAL_TABLET | SUBLINGUAL | Status: DC | PRN

## 2022-12-29 MED ORDER — FENTANYL CITRATE (PF) 250 MCG/5ML IJ SOLN
INTRAMUSCULAR | Status: DC | PRN
  Administered 2022-12-29 (×2): 50 ug via INTRAVENOUS
  Administered 2022-12-29: 100 ug via INTRAVENOUS
  Administered 2022-12-29: 50 ug via INTRAVENOUS

## 2022-12-29 MED ORDER — ORAL CARE MOUTH RINSE: 15.0000 mL | Freq: Once | OROMUCOSAL | Status: AC

## 2022-12-29 MED ORDER — DEXAMETHASONE SODIUM PHOSPHATE 10 MG/ML IJ SOLN
INTRAMUSCULAR | Status: DC | PRN
  Administered 2022-12-29: 10 mg via INTRAVENOUS

## 2022-12-29 MED ORDER — AMISULPRIDE (ANTIEMETIC) 5 MG/2ML IV SOLN: 10.0000 mg | Freq: Once | INTRAVENOUS | Status: DC | PRN

## 2022-12-29 MED ORDER — LACTATED RINGERS IR SOLN
Status: DC | PRN
  Administered 2022-12-29: 1000 mL

## 2022-12-29 MED ORDER — ACETAMINOPHEN 10 MG/ML IV SOLN
INTRAVENOUS | Status: DC | PRN
  Administered 2022-12-29: 1000 mg via INTRAVENOUS

## 2022-12-29 MED ORDER — PROPOFOL 10 MG/ML IV BOLUS
INTRAVENOUS | Status: AC
  Filled 2022-12-29: qty 20

## 2022-12-29 MED ORDER — PROMETHAZINE HCL 25 MG/ML IJ SOLN: 6.2500 mg | INTRAMUSCULAR | Status: DC | PRN

## 2022-12-29 MED ORDER — HYDROCODONE-ACETAMINOPHEN 5-325 MG PO TABS: 1.0000 | ORAL_TABLET | Freq: Four times a day (QID) | ORAL | 0 refills | Status: DC | PRN

## 2022-12-29 MED ORDER — DOCUSATE SODIUM 100 MG PO CAPS: 100.0000 mg | ORAL_CAPSULE | Freq: Two times a day (BID) | ORAL | Status: DC

## 2022-12-29 MED ORDER — ROCURONIUM BROMIDE 10 MG/ML (PF) SYRINGE
PREFILLED_SYRINGE | INTRAVENOUS | Status: AC
  Filled 2022-12-29: qty 10

## 2022-12-29 MED ORDER — LACTATED RINGERS IV SOLN: INTRAVENOUS | Status: DC

## 2022-12-29 MED ORDER — SODIUM CHLORIDE 0.9% FLUSH
INTRAVENOUS | Status: DC | PRN
  Administered 2022-12-29: 20 mL via INTRAVENOUS

## 2022-12-29 MED ORDER — HYDROMORPHONE HCL 1 MG/ML IJ SOLN
0.2500 mg | INTRAMUSCULAR | Status: DC | PRN
  Administered 2022-12-29 (×2): 0.5 mg via INTRAVENOUS

## 2022-12-29 MED ORDER — TRIPLE ANTIBIOTIC 3.5-400-5000 EX OINT: 1.0000 | TOPICAL_OINTMENT | Freq: Three times a day (TID) | CUTANEOUS | Status: DC | PRN

## 2022-12-29 MED ORDER — DIPHENHYDRAMINE HCL 12.5 MG/5ML PO ELIX: 12.5000 mg | ORAL_SOLUTION | Freq: Four times a day (QID) | ORAL | Status: DC | PRN

## 2022-12-29 MED ORDER — LIDOCAINE HCL (PF) 2 % IJ SOLN
INTRAMUSCULAR | Status: AC
  Filled 2022-12-29: qty 5

## 2022-12-29 MED ORDER — OXYCODONE HCL 5 MG PO TABS
5.0000 mg | ORAL_TABLET | ORAL | Status: DC | PRN
  Administered 2022-12-29 – 2022-12-31 (×4): 5 mg via ORAL
  Filled 2022-12-29 (×4): qty 1

## 2022-12-29 MED ORDER — PHENYLEPHRINE 80 MCG/ML (10ML) SYRINGE FOR IV PUSH (FOR BLOOD PRESSURE SUPPORT)
PREFILLED_SYRINGE | INTRAVENOUS | Status: AC
  Filled 2022-12-29: qty 10

## 2022-12-29 MED ORDER — ACETAMINOPHEN 500 MG PO TABS
1000.0000 mg | ORAL_TABLET | Freq: Four times a day (QID) | ORAL | Status: AC
  Administered 2022-12-29 – 2022-12-30 (×4): 1000 mg via ORAL
  Filled 2022-12-29 (×4): qty 2

## 2022-12-29 MED ORDER — BUPIVACAINE LIPOSOME 1.3 % IJ SUSP
INTRAMUSCULAR | Status: AC
  Filled 2022-12-29: qty 20

## 2022-12-29 MED ORDER — PHENYLEPHRINE 80 MCG/ML (10ML) SYRINGE FOR IV PUSH (FOR BLOOD PRESSURE SUPPORT)
PREFILLED_SYRINGE | INTRAVENOUS | Status: DC | PRN
  Administered 2022-12-29 (×2): 160 ug via INTRAVENOUS
  Administered 2022-12-29: 80 ug via INTRAVENOUS
  Administered 2022-12-29: 160 ug via INTRAVENOUS

## 2022-12-29 MED ORDER — ONDANSETRON HCL 4 MG/2ML IJ SOLN
INTRAMUSCULAR | Status: DC | PRN
  Administered 2022-12-29: 4 mg via INTRAVENOUS

## 2022-12-29 MED ORDER — DOCUSATE SODIUM 100 MG PO CAPS
100.0000 mg | ORAL_CAPSULE | Freq: Two times a day (BID) | ORAL | Status: DC
  Administered 2022-12-29 – 2022-12-31 (×4): 100 mg via ORAL
  Filled 2022-12-29 (×4): qty 1

## 2022-12-29 MED ORDER — FENTANYL CITRATE (PF) 250 MCG/5ML IJ SOLN
INTRAMUSCULAR | Status: AC
  Filled 2022-12-29: qty 5

## 2022-12-29 MED ORDER — DAPAGLIFLOZIN PROPANEDIOL 10 MG PO TABS
10.0000 mg | ORAL_TABLET | Freq: Every day | ORAL | Status: DC
  Administered 2022-12-30 – 2022-12-31 (×2): 10 mg via ORAL
  Filled 2022-12-29 (×2): qty 1

## 2022-12-29 MED ORDER — MIDAZOLAM HCL 2 MG/2ML IJ SOLN
INTRAMUSCULAR | Status: AC
  Filled 2022-12-29: qty 2

## 2022-12-29 MED ORDER — HYDROMORPHONE HCL 1 MG/ML IJ SOLN
0.5000 mg | INTRAMUSCULAR | Status: DC | PRN
  Administered 2022-12-29 – 2022-12-30 (×4): 1 mg via INTRAVENOUS
  Filled 2022-12-29 (×3): qty 1

## 2022-12-29 MED ORDER — LACTATED RINGERS IV SOLN: INTRAVENOUS | Status: DC | PRN

## 2022-12-29 MED ORDER — HYDROMORPHONE HCL 1 MG/ML IJ SOLN
INTRAMUSCULAR | Status: AC
  Filled 2022-12-29: qty 1

## 2022-12-29 MED ORDER — OXYCODONE HCL 5 MG/5ML PO SOLN: 5.0000 mg | Freq: Once | ORAL | Status: AC | PRN

## 2022-12-29 MED ORDER — PROPOFOL 10 MG/ML IV BOLUS
INTRAVENOUS | Status: DC | PRN
  Administered 2022-12-29: 200 mg via INTRAVENOUS

## 2022-12-29 MED ORDER — SODIUM CHLORIDE (PF) 0.9 % IJ SOLN
INTRAMUSCULAR | Status: AC
  Filled 2022-12-29: qty 20

## 2022-12-29 MED ORDER — DIPHENHYDRAMINE HCL 50 MG/ML IJ SOLN: 12.5000 mg | Freq: Four times a day (QID) | INTRAMUSCULAR | Status: DC | PRN

## 2022-12-29 MED ORDER — STERILE WATER FOR IRRIGATION IR SOLN
Status: DC | PRN
  Administered 2022-12-29: 1000 mL

## 2022-12-29 MED ORDER — OXYCODONE HCL 5 MG PO TABS
5.0000 mg | ORAL_TABLET | Freq: Once | ORAL | Status: AC | PRN
  Administered 2022-12-29: 5 mg via ORAL

## 2022-12-29 MED ORDER — SUGAMMADEX SODIUM 200 MG/2ML IV SOLN
INTRAVENOUS | Status: DC | PRN
  Administered 2022-12-29: 230 mg via INTRAVENOUS

## 2022-12-29 MED ORDER — LIDOCAINE 2% (20 MG/ML) 5 ML SYRINGE
INTRAMUSCULAR | Status: DC | PRN
  Administered 2022-12-29: 80 mg via INTRAVENOUS

## 2022-12-29 MED ORDER — SODIUM CHLORIDE 0.45 % IV SOLN: INTRAVENOUS | Status: DC

## 2022-12-29 MED ORDER — ROCURONIUM BROMIDE 10 MG/ML (PF) SYRINGE
PREFILLED_SYRINGE | INTRAVENOUS | Status: DC | PRN
  Administered 2022-12-29: 100 mg via INTRAVENOUS
  Administered 2022-12-29: 20 mg via INTRAVENOUS
  Administered 2022-12-29: 30 mg via INTRAVENOUS

## 2022-12-29 MED ORDER — ACETAMINOPHEN 10 MG/ML IV SOLN
INTRAVENOUS | Status: AC
  Filled 2022-12-29: qty 100

## 2022-12-29 MED ORDER — OXYCODONE HCL 5 MG PO TABS
ORAL_TABLET | ORAL | Status: AC
  Filled 2022-12-29: qty 1

## 2022-12-29 MED ORDER — MAGNESIUM CITRATE PO SOLN: 1.0000 | Freq: Once | ORAL | Status: DC

## 2022-12-29 SURGICAL SUPPLY — 84 items
ADH SKN CLS APL DERMABOND .7 (GAUZE/BANDAGES/DRESSINGS) ×1
AGENT HMST KT MTR STRL THRMB (HEMOSTASIS) ×1
APL ESCP 34 STRL LF DISP (HEMOSTASIS) ×1
APL PRP STRL LF DISP 70% ISPRP (MISCELLANEOUS) ×1
APPLICATOR SURGIFLO ENDO (HEMOSTASIS) ×1 IMPLANT
BAG COUNTER SPONGE SURGICOUNT (BAG) IMPLANT
BAG LAPAROSCOPIC 12 15 PORT 16 (BASKET) IMPLANT
BAG RETRIEVAL 12/15 (BASKET) ×1
BAG SPNG CNTER NS LX DISP (BAG)
CHLORAPREP W/TINT 26 (MISCELLANEOUS) ×1 IMPLANT
CLIP LIGATING HEM O LOK PURPLE (MISCELLANEOUS) ×2 IMPLANT
CLIP LIGATING HEMO LOK XL GOLD (MISCELLANEOUS) IMPLANT
CLIP LIGATING HEMO O LOK GREEN (MISCELLANEOUS) ×1 IMPLANT
CLIP SUT LAPRA TY ABSORB (SUTURE) ×1 IMPLANT
COVER SURGICAL LIGHT HANDLE (MISCELLANEOUS) ×1 IMPLANT
COVER TIP SHEARS 8 DVNC (MISCELLANEOUS) ×1 IMPLANT
CUTTER ECHEON FLEX ENDO 45 340 (ENDOMECHANICALS) IMPLANT
DERMABOND ADVANCED .7 DNX12 (GAUZE/BANDAGES/DRESSINGS) ×1 IMPLANT
DRAIN CHANNEL 15F RND FF 3/16 (WOUND CARE) ×1 IMPLANT
DRAPE ARM DVNC X/XI (DISPOSABLE) ×4 IMPLANT
DRAPE COLUMN DVNC XI (DISPOSABLE) ×1 IMPLANT
DRAPE INCISE IOBAN 66X45 STRL (DRAPES) ×1 IMPLANT
DRAPE SHEET LG 3/4 BI-LAMINATE (DRAPES) ×1 IMPLANT
DRIVER NDL LRG 8 DVNC XI (INSTRUMENTS) ×3 IMPLANT
DRIVER NDLE LRG 8 DVNC XI (INSTRUMENTS) ×3 IMPLANT
DRSG TEGADERM 4X4.75 (GAUZE/BANDAGES/DRESSINGS) ×1 IMPLANT
ELECT PENCIL ROCKER SW 15FT (MISCELLANEOUS) ×1 IMPLANT
ELECT REM PT RETURN 15FT ADLT (MISCELLANEOUS) ×1 IMPLANT
EVACUATOR SILICONE 100CC (DRAIN) ×1 IMPLANT
FORCEPS BPLR FENES DVNC XI (FORCEP) ×1 IMPLANT
FORCEPS PROGRASP DVNC XI (FORCEP) ×1 IMPLANT
GAUZE 4X4 16PLY ~~LOC~~+RFID DBL (SPONGE) ×1 IMPLANT
GAUZE SPONGE 2X2 8PLY STRL LF (GAUZE/BANDAGES/DRESSINGS) ×1 IMPLANT
GLOVE BIO SURGEON STRL SZ 6.5 (GLOVE) ×1 IMPLANT
GLOVE SURG LX STRL 7.5 STRW (GLOVE) ×2 IMPLANT
GOWN SRG XL LVL 4 BRTHBL STRL (GOWNS) ×1 IMPLANT
GOWN STRL NON-REIN XL LVL4 (GOWNS) ×1
GOWN STRL REUS W/ TWL XL LVL3 (GOWN DISPOSABLE) ×2 IMPLANT
GOWN STRL REUS W/TWL XL LVL3 (GOWN DISPOSABLE) ×2
HEMOSTAT SURGICEL 4X8 (HEMOSTASIS) ×1 IMPLANT
HOLDER FOLEY CATH W/STRAP (MISCELLANEOUS) ×1 IMPLANT
IRRIG SUCT STRYKERFLOW 2 WTIP (MISCELLANEOUS) ×1
IRRIGATION SUCT STRKRFLW 2 WTP (MISCELLANEOUS) ×1 IMPLANT
KIT BASIN OR (CUSTOM PROCEDURE TRAY) ×1 IMPLANT
KIT TURNOVER KIT A (KITS) IMPLANT
LOOP VESSEL MAXI BLUE (MISCELLANEOUS) ×1 IMPLANT
MARKER SKIN DUAL TIP RULER LAB (MISCELLANEOUS) ×1 IMPLANT
NDL HYPO 22X1.5 SAFETY MO (MISCELLANEOUS) ×1 IMPLANT
NDL INSUFFLATION 14GA 120MM (NEEDLE) ×1 IMPLANT
NEEDLE HYPO 22X1.5 SAFETY MO (MISCELLANEOUS) ×1 IMPLANT
NEEDLE INSUFFLATION 14GA 120MM (NEEDLE) ×1 IMPLANT
PORT ACCESS TROCAR AIRSEAL 12 (TROCAR) ×1 IMPLANT
PROTECTOR NERVE ULNAR (MISCELLANEOUS) ×2 IMPLANT
RELOAD STAPLE 45 2.6 WHT THIN (STAPLE) IMPLANT
SCISSORS LAP 5X45 EPIX DISP (ENDOMECHANICALS) IMPLANT
SCISSORS MNPLR CVD DVNC XI (INSTRUMENTS) ×1 IMPLANT
SEAL UNIV 5-12 XI (MISCELLANEOUS) ×3 IMPLANT
SET TRI-LUMEN FLTR TB AIRSEAL (TUBING) ×1 IMPLANT
SOL ELECTROSURG ANTI STICK (MISCELLANEOUS) ×1
SOLUTION ELECTROSURG ANTI STCK (MISCELLANEOUS) ×1 IMPLANT
SPIKE FLUID TRANSFER (MISCELLANEOUS) ×1 IMPLANT
SPONGE T-LAP 4X18 ~~LOC~~+RFID (SPONGE) ×1 IMPLANT
STAPLE RELOAD 45 WHT (STAPLE) ×4 IMPLANT
STAPLE RELOAD 45MM WHITE (STAPLE) ×4
SURGIFLO W/THROMBIN 8M KIT (HEMOSTASIS) ×1 IMPLANT
SUT ETHILON 3 0 PS 1 (SUTURE) ×1 IMPLANT
SUT MNCRL AB 4-0 PS2 18 (SUTURE) ×2 IMPLANT
SUT PDS AB 1 CT1 27 (SUTURE) ×2 IMPLANT
SUT V-LOC BARB 180 2/0GR6 GS22 (SUTURE)
SUT VIC AB 0 CT1 27 (SUTURE) ×4
SUT VIC AB 0 CT1 27XBRD ANTBC (SUTURE) ×4 IMPLANT
SUT VIC AB 2-0 SH 27 (SUTURE) ×2
SUT VIC AB 2-0 SH 27X BRD (SUTURE) ×2 IMPLANT
SUT VLOC BARB 180 ABS3/0GR12 (SUTURE) ×1
SUTURE V-LC BRB 180 2/0GR6GS22 (SUTURE) IMPLANT
SUTURE VLOC BRB 180 ABS3/0GR12 (SUTURE) ×1 IMPLANT
SYS BAG RETRIEVAL 10MM (BASKET) ×1
SYSTEM BAG RETRIEVAL 10MM (BASKET) ×1 IMPLANT
TOWEL OR 17X26 10 PK STRL BLUE (TOWEL DISPOSABLE) ×1 IMPLANT
TRAY FOLEY MTR SLVR 16FR STAT (SET/KITS/TRAYS/PACK) ×1 IMPLANT
TRAY LAPAROSCOPIC (CUSTOM PROCEDURE TRAY) ×1 IMPLANT
TROCAR Z THREAD OPTICAL 12X100 (TROCAR) ×1 IMPLANT
TROCAR Z-THREAD OPTICAL 5X100M (TROCAR) IMPLANT
WATER STERILE IRR 1000ML POUR (IV SOLUTION) ×2 IMPLANT

## 2022-12-29 NOTE — H&P (Signed)
Mike Cruz is an 57 y.o. male.    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 1 artery / 1 vein left renovascular anatomy and has increased in size on surveillance before excision.   Summaried Recent Course:  02/2021 -CT, MRI - NEW left upper posterior 1.9cm mass (90% endophytic), Left mid 8mm non-obstructing, Rt punctate papillary tip calc.  08/2021 - MRI Left upper posterior mass 2.1cm, Cr 1.2; 02/2022 Renal US - 2.2 cm left upper w/o hypervascularity.  08/2022 - CT left upper mass 2.4cm, Cr 1.5, stone free, MRI 10/2022 (done by GI MD) stable 2.4cm mass (my measurment).   2 - Stage 3 Renal Insufficiency - Cr 1.8's x years. Has h/o sarcoid. No hydro on imaging x many.   3 - Recurrent Nephrolithiasis - s/p Rt SWL 2017 and Lt 2023 (by another provider, I strongly discourage SWL in man with renal tumros) for ureteral stone.   4- Rule Out Hereditary Cancer Syndrome - pt's fatehr with kdiney and prostate cancer, other family members with breast and ovarian cancer. Pt with multifocal kidney cancer. Had Comprehensive testing 2019 which ruled out overt hereiditary syndrom ([facility], [facility]).   PMH sig for sarcoid (follows Dr. Shan Levans on pred / methotrexate), mild obesity, knee scope, pancreatic cysts (follows Lenoria Farrier MD, GI). No ischemic CV disese or chest/abd surgeries. 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 His PCP is Robley Fries MD.   Today " Mike Cruz " is seen to proceed with LEFT robotic partial nephrectomy. No interval fevers. Hgb 12.3, Cr 1.9.    Past Medical History:  Diagnosis Date   Acute renal failure (ARF) (HCC)    Acute respiratory failure (HCC)    Anemia    Cancer (HCC)    Cancer of kidney, left (HCC) 01/2021   Family history of colon cancer    Family history of kidney cancer    Family history of ovarian cancer    Family  history of prostate cancer    Family history of stomach cancer    History of kidney stones    Hypertension    Hyponatremia    Kidney stone    kidney mass 04/13/17   Pneumonia    Sarcoidosis     Past Surgical History:  Procedure Laterality Date   EXTRACORPOREAL SHOCK WAVE LITHOTRIPSY Left 05/03/2022   Procedure: LEFT EXTRACORPOREAL SHOCK WAVE LITHOTRIPSY (ESWL);  Surgeon: Crist Fat, MD;  Location: [facility];  Service: Urology;  Laterality: Left;   HEAD & NECK SKIN LESION EXCISIONAL BIOPSY     KNEE SURGERY Left    Arthroscopic   partial nephrectomt     Left 04/20/17 Dr. Berneice Heinrich   ROBOTIC ASSITED PARTIAL NEPHRECTOMY Left 04/20/2017   Procedure: XI ROBOTIC ASSITED PARTIAL NEPHRECTOMY;  Surgeon: Sebastian Ache, MD;  Location: [facility];  Service: Urology;  Laterality: Left;    Family History  Problem Relation Age of Onset   Ovarian cancer Mother 71   AAA (abdominal aortic aneurysm) Father    Prostate cancer Father 22   Kidney cancer Father 75       was 2 separate primaries, not a met   Stroke Sister 83   Healthy Daughter    Healthy Daughter    Healthy Son    Lung cancer Maternal Aunt    Stomach cancer Maternal Grandmother 16   Colon cancer Maternal  Grandmother 75   Prostate cancer Maternal Grandfather 82       metastatic   Lymphoma Cousin    Prostate cancer Cousin 91   Social History:  reports that he has never smoked. He has been exposed to tobacco smoke. He has never used smokeless tobacco. He reports that he does not drink alcohol and does not use drugs.  Allergies:  Allergies  Allergen Reactions   Penicillins Hives    Denies airway involvement    Medications Prior to Admission  Medication Sig Dispense Refill   FARXIGA 10 MG TABS tablet Take 10 mg by mouth daily.     Multiple Vitamins-Minerals (MULTIVITAMIN ADULTS) TABS Take 1 tablet by mouth daily.     Olmesartan-Amlodipine-HCTZ 40-5-12.5 MG TABS Take 1 tablet by mouth every morning.  3     No results found for this or any previous visit (from the past 48 hour(s)). No results found.  Review of Systems  Constitutional:  Negative for chills and fever.  All other systems reviewed and are negative.   Blood pressure (!) 146/83, pulse 66, temperature 97.9 F (36.6 C), temperature source Oral, resp. rate 17. Physical Exam Vitals reviewed.  HENT:     Head: Normocephalic.     Mouth/Throat:     Mouth: Mucous membranes are moist.  Eyes:     Pupils: Pupils are equal, round, and reactive to light.  Cardiovascular:     Rate and Rhythm: Normal rate.  Pulmonary:     Effort: Pulmonary effort is normal.  Abdominal:     General: Abdomen is flat.     Comments: Stable mild obesity, prior scars w/o hernias  Genitourinary:    Comments: No CVAT at present Musculoskeletal:        General: Normal range of motion.     Cervical back: Normal range of motion.  Skin:    General: Skin is warm.  Neurological:     General: No focal deficit present.     Mental Status: He is alert.  Psychiatric:        Mood and Affect: Mood normal.      Assessment/Plan  Proceed as planned with LEFT robotic partial nephrectomy as planned. Risks, benefits, alternatives, expected peri-op course discussed previously and reiterated today.   Loletta Parish., MD 12/29/2022, 6:40 AM

## 2022-12-29 NOTE — Discharge Instructions (Addendum)

## 2022-12-29 NOTE — Op Note (Signed)
NAMEWATT, KRAATZ MEDICAL RECORD NO: [id number] ACCOUNT NO: [id number] DATE OF BIRTH: 08-31-1965 FACILITY: [facility] LOCATION: [facility] PHYSICIAN: Sebastian Ache, MD  Operative Report   SURGEON:  Sebastian Ache, MD  PREOPERATIVE DIAGNOSES:  Multifocal left renal neoplasms, history of prior kidney cancer.  POSTOPERATIVE DIAGNOSES:  Multifocal left renal neoplasms, history of prior kidney cancer.  PROCEDURES: 1.  Robotic-assisted laparoscopic left radical nephrectomy. 2.  Intraoperative ultrasound with interpretation.  ESTIMATED BLOOD LOSS:  100 mL.  COMPLICATIONS:  None.  SPECIMEN:  Left radical nephrectomy.  History of prior partial nephrectomy for permanent pathology.  FINDINGS: 1.  Severe desmoplastic reaction around the leftkidney, all planes incredibly stuck consistent with prior surgery and prior shockwave lithotripsy. 2.  One artery, one vein, left renovascular anatomy anticipated. 3.  Much more endophytic than anticipated left upper pole renal mass.  No external cues available for visualization. 4.  Multifocal left renal masses separate focus visualized, approximately 1 centimeter inferior to the prior lesion. 5.  Decision made to proceed with left radical nephrectomy given the multifocality and extent of tumor.  INDICATIONS:  The patient is a very pleasant 57 year old man with history of prior left sided renal cell carcinoma.  He is status post partial nephrectomy in 2018.  He was on surveillance prior to this and surveillance after this and has been  exceptionally compliant.  He was found on surveillance imaging to have a new separate focus of renal cancer in his upper pole.  This was revealed as per the [facility] protocol with plan to perform definitive management when lesion is greater than 2.5 cm.  This  lesion is now approximately 2.4 cm and he adamantly wished proceed with definitive therapy.  Options were discussed including continued surveillance versus attempted redo partial  nephrectomy versus radical nephrectomy versus ablative therapies.  In  keeping with protocol, we agreed upon attempted left partial nephrectomy.  Notably, he is a stone former.  He does have a history of left sided shock wave lithotripsy.  Therefore, initial plan of course was to maximize renal preservation if at all  possible.  Informed consent was obtained and placed in medical record.  PROCEDURE IN DETAIL:  The patient being Kaien Vaden was verified and procedure being left partial nephrectomy was confirmed.  Procedure timeout was performed.  Intravenous antibiotics administered.  General endotracheal anesthesia induced.  The patient  was placed in the left side up full flank position and pulling 15 degrees of table flexion, superior arm elevator, axillary roll, sequential compression devices, bottom leg bent, top leg straight.  Foley catheter was placed free to straight drain.  He  was further fastened to operating table using 3-inch tape over foam padding across supraxiphoid chest and his pelvis.  Beanbag was deployed.  Sterile field was created, prepped and draped the patient's entire left flank and abdomen using chlorhexidine  gluconate.  Next, a high flow, low pressure pneumoperitoneum was obtained using Veress technique in left lower quadrant, having passed the aspiration drop test.  An 8 mm robotic camera port was then placed in position approximately one handbreadth  superior medial to the umbilicus.  Laparoscopic examination of the peritoneal cavity revealed a minimal amount of intraperitoneal adhesions which were quite favorable.  Additional ports were placed as follows:  Left subcostal 8 mm robotic port, left far  lateral 8 mm robotic port, approximately 4 fingerbreadths superior medial to the anterior iliac spine, left paramedian inferior robotic port, approximately 1 handbreadth superior to pubic ramus and two 12 mm assistant  port sites to midline, one  approximately 2 fingerbreadths  inferior to the planned camera port and another 2 fingerbreadths superior with the superior one being AirSeal type.  Robot was docked and passed the electronic checks.  Initial attention was directed at development of  retroperitoneum.  Incision made lateral to the descending colon from the area of the splenic flexure towards the area of the internal ring and then colon was carefully swept medially.  In the retroperitoneum, there was extensive desmoplastic reaction in  the plane between the Gerota's fascia and the mesentery of the colon.  The colon was very, very sticky and difficult.  Exquisite care was taken to dissect the descending colon off the anterior surface of Gerota's fascia to avoid any sort of bowel injury  did not occur.  There were also significant adhesions between the splenic flexure and the inferior splenic edge, which was very carefully dissected free with cold scissors.  There was a very small avulsion to the splenic capsule.  This was controlled  using coagulation current and Surgicel pack at this point, which revealed excellent hemostasis.  The presumed lower pole of the kidney area was identified, placed on gentle lateral traction.  Dissection proceeded medial to this. The ureter was  encountered, placed on gentle lateral traction as was the psoas musculature.  Dissection proceeded towards the renal hilum.  Again, this plane was incredibly desmoplastic and dissection was exquisitely careful. The hilum was encountered, consisted of a  single vein, single artery renovascular anatomy was anticipated.  The vein was mobilized enough so that superior and inferior edges could be easily visualized.  This was medial to the gonadal insertion.  Artery circumferentially mobilized, marked with a  vessel loop and dissection proceeded on to rim of the kidney and the anterior plane.  Again, all fat around the kidney was incredibly desmoplastic and stuck.  Renal vein was identified anteriorly and then  carefully ring was defatted superiorly towards  the area of the tumor.  The old partial nephrectomy site was visualized with this clips.  This was unremarkable grossly for residual tumor recurrence in the area. Using visual cues in relationship to the hilum and the spleen, the area of presumed tumor  was then defatted.  Notably, there were no visceral cues whatsoever signifying where this tumor was.  Intraoperative ultrasound was then performed.  Intraoperative ultrasound revealed a heterogeneous, very endophytic solid mass at the upper pole.  The deep aspect was also somewhat heterogeneous and appeared contact with the outer aspect of the collecting system.  At this point, I elected to start planning  the partial nephrectomy plane using essentially all ultrasound-guided cues.  A presumed partial nephrectomy plane was scored around the superior and lateral aspect of the kidney.  After further interrogated the kidney with ultrasound and visible cues, I  came across another focus of tumor.  This was approximately one-half cm inferior and medial to the initial focus that was quite concerning for multifocal disease and a plane to encompass  both of these sites would be essentially performing a  heminephrectomy. Given the incredibly desmoplastic reaction around the kidney, I felt that reconstruction of such defect would be nearly impossible to do safely.  After very careful consideration, decision was made to proceed with radical nephrectomy on  the left side. Given the multifocality of tumor and desmoplastic reaction, which would likely make any sort of renorrhaphy quite risky, this is a somber decision to make as well as being the safest and  most definitive management. Hilum was once again  visualized. Artery was controlled using extra-large Hem-o-lok clip proximal, vascular stapler distal.  The vein was controlled using vascular stapler.  This resulted in excellent hemostatic control of the hilum.  Ureter was  identified, doubly clipped and  ligated.  Lateral attachments taken down and superior attachments were very carefully taken down keeping the adrenal with the specimen given the multifocality of tumor.  This completely freed up the left radical nephrectomy specimen which was placed  into the EndoCatch bag for later retrieval.  Sponge and needle counts were correct.  Hemostasis was excellent.  The area of prior small splenic laceration was visualized and again it was completely hemostatic.  Robot was then undocked.  Specimen was retrieved  by extending the previous assistant port sites in the midline and removing the radical nephrectomy specimen, setting aside for permanent pathology.  Extraction site was closed to the level of fascia using figure-of-eight PDS x7 followed by  reapproximation of Scarpa's with running Vicryl.  All incision sites were infiltrated with dilute lipolyzed Marcaine and closed at the level of skin using subcuticular Monocryl followed by Dermabond.  Procedure was then terminated.  The patient tolerated  procedure well, no immediate complications.  The patient taken to postanesthesia care unit in stable condition.  Please note, first assistant, Harrie Foreman, was crucial for all portions of surgery today.  She provided invaluable retraction, suctioning, vascular clipping, vascular stapling, ultrasound manipulation, and general first assistance.   NIK D: 12/29/2022 12:13:42 pm T: 12/29/2022 2:08:00 pm  JOB: [id number]/ [id number]

## 2022-12-29 NOTE — Transfer of Care (Signed)
Immediate Anesthesia Transfer of Care Note  Patient: Mike Cruz  Procedure(s) Performed: XI ROBOTIC ASSITED RADICAL NEPHRECTOMY WITH INTRAOPERATIVE ULTRASOUND (Left: Abdomen)  Patient Location: PACU  Anesthesia Type:General  Level of Consciousness: awake, oriented, and patient cooperative  Airway & Oxygen Therapy: Patient Spontanous Breathing and Patient connected to face mask oxygen  Post-op Assessment: Report given to RN and Post -op Vital signs reviewed and unstable, Anesthesiologist notified  Post vital signs: Reviewed  Last Vitals:  Vitals Value Taken Time  BP 154/85 12/29/22 1218  Temp 36.7 C 12/29/22 1218  Pulse 70 12/29/22 1223  Resp 13 12/29/22 1222  SpO2 100 % 12/29/22 1223  Vitals shown include unvalidated device data.  Last Pain:  Vitals:   12/29/22 1218  TempSrc:   PainSc: 5          Complications: No notable events documented.

## 2022-12-29 NOTE — Brief Op Note (Signed)
12/29/2022  12:02 PM  PATIENT:  Mike Cruz  57 y.o. male  PRE-OPERATIVE DIAGNOSIS:  LEFT RENAL MASS  POST-OPERATIVE DIAGNOSIS:  LEFT RENAL MASS  PROCEDURE:  Procedure(s) with comments: XI ROBOTIC ASSITED RADICAL NEPHRECTOMY WITH INTRAOPERATIVE ULTRASOUND (Left) - 3HRS  SURGEON:  Surgeon(s) and Role:    * Darleene Cumpian, Delbert Phenix., MD - Primary  PHYSICIAN ASSISTANT:   ASSISTANTS: Flo Shanks PA   ANESTHESIA:   local and general  EBL:  170 mL   BLOOD ADMINISTERED:none  DRAINS:  foley to gravity    LOCAL MEDICATIONS USED:  MARCAINE     SPECIMEN:  Source of Specimen:  left radical nephrectomy  DISPOSITION OF SPECIMEN:  PATHOLOGY  COUNTS:  YES  TOURNIQUET:  * No tourniquets in log *  DICTATION: .Other Dictation: Dictation Number [id number]  PLAN OF CARE: Admit to inpatient   PATIENT DISPOSITION:  PACU - hemodynamically stable.   Delay start of Pharmacological VTE agent (>24hrs) due to surgical blood loss or risk of bleeding: yes

## 2022-12-29 NOTE — Anesthesia Postprocedure Evaluation (Signed)
Anesthesia Post Note  Patient: Mike Cruz  Procedure(s) Performed: XI ROBOTIC ASSITED RADICAL NEPHRECTOMY WITH INTRAOPERATIVE ULTRASOUND (Left: Abdomen)     Patient location during evaluation: PACU Anesthesia Type: General Level of consciousness: awake and alert Pain management: pain level controlled Vital Signs Assessment: post-procedure vital signs reviewed and stable Respiratory status: spontaneous breathing, nonlabored ventilation and respiratory function stable Cardiovascular status: blood pressure returned to baseline and stable Postop Assessment: no apparent nausea or vomiting Anesthetic complications: no   No notable events documented.  Last Vitals:  Vitals:   12/29/22 1330 12/29/22 1400  BP: (!) 151/87 (!) 153/81  Pulse: 81 82  Resp: 10 12  Temp:    SpO2: 92% 92%    Last Pain:  Vitals:   12/29/22 1428  TempSrc:   PainSc: 7                  Lowella Curb

## 2022-12-29 NOTE — Anesthesia Procedure Notes (Addendum)
Procedure Name: Intubation Date/Time: 12/29/2022 8:50 AM  Performed by: Lovie Chol, CRNAPre-anesthesia Checklist: Patient identified, Emergency Drugs available, Suction available and Patient being monitored Patient Re-evaluated:Patient Re-evaluated prior to induction Oxygen Delivery Method: Circle system utilized Preoxygenation: Pre-oxygenation with 100% oxygen Induction Type: IV induction Ventilation: Mask ventilation without difficulty Laryngoscope Size: Miller and 3 Grade View: Grade I Tube type: Oral Tube size: 7.5 mm Number of attempts: 1 Airway Equipment and Method: Stylet Placement Confirmation: ETT inserted through vocal cords under direct vision, positive ETCO2 and breath sounds checked- equal and bilateral Secured at: 22 cm Tube secured with: Tape Dental Injury: Teeth and Oropharynx as per pre-operative assessment

## 2022-12-30 ENCOUNTER — Encounter (HOSPITAL_COMMUNITY): Payer: Self-pay | Admitting: Urology

## 2022-12-30 DIAGNOSIS — Z6838 Body mass index (BMI) 38.0-38.9, adult: Secondary | ICD-10-CM | POA: Diagnosis not present

## 2022-12-30 DIAGNOSIS — Z801 Family history of malignant neoplasm of trachea, bronchus and lung: Secondary | ICD-10-CM | POA: Diagnosis not present

## 2022-12-30 DIAGNOSIS — Z8051 Family history of malignant neoplasm of kidney: Secondary | ICD-10-CM | POA: Diagnosis not present

## 2022-12-30 DIAGNOSIS — E669 Obesity, unspecified: Secondary | ICD-10-CM | POA: Diagnosis present

## 2022-12-30 DIAGNOSIS — Z800 Family history of malignant neoplasm of digestive organs: Secondary | ICD-10-CM | POA: Diagnosis not present

## 2022-12-30 DIAGNOSIS — C642 Malignant neoplasm of left kidney, except renal pelvis: Secondary | ICD-10-CM | POA: Diagnosis present

## 2022-12-30 DIAGNOSIS — N179 Acute kidney failure, unspecified: Secondary | ICD-10-CM | POA: Diagnosis present

## 2022-12-30 DIAGNOSIS — Z807 Family history of other malignant neoplasms of lymphoid, hematopoietic and related tissues: Secondary | ICD-10-CM | POA: Diagnosis not present

## 2022-12-30 DIAGNOSIS — Z8042 Family history of malignant neoplasm of prostate: Secondary | ICD-10-CM | POA: Diagnosis not present

## 2022-12-30 DIAGNOSIS — Z880 Allergy status to penicillin: Secondary | ICD-10-CM | POA: Diagnosis not present

## 2022-12-30 DIAGNOSIS — Z8041 Family history of malignant neoplasm of ovary: Secondary | ICD-10-CM | POA: Diagnosis not present

## 2022-12-30 DIAGNOSIS — N2889 Other specified disorders of kidney and ureter: Secondary | ICD-10-CM | POA: Diagnosis present

## 2022-12-30 DIAGNOSIS — I10 Essential (primary) hypertension: Secondary | ICD-10-CM | POA: Diagnosis present

## 2022-12-30 DIAGNOSIS — Z823 Family history of stroke: Secondary | ICD-10-CM | POA: Diagnosis not present

## 2022-12-30 LAB — BASIC METABOLIC PANEL
Anion gap: 10 (ref 5–15)
BUN: 26 mg/dL — ABNORMAL HIGH (ref 6–20)
CO2: 23 mmol/L (ref 22–32)
Calcium: 8.7 mg/dL — ABNORMAL LOW (ref 8.9–10.3)
Chloride: 100 mmol/L (ref 98–111)
Creatinine, Ser: 2.78 mg/dL — ABNORMAL HIGH (ref 0.61–1.24)
GFR, Estimated: 25 mL/min — ABNORMAL LOW (ref >60)
Glucose, Bld: 115 mg/dL — ABNORMAL HIGH (ref 70–99)
Potassium: 4 mmol/L (ref 3.5–5.1)
Sodium: 133 mmol/L — ABNORMAL LOW (ref 135–145)

## 2022-12-30 LAB — GLUCOSE, CAPILLARY: Glucose-Capillary: 92 mg/dL (ref 70–99)

## 2022-12-30 LAB — HEMOGLOBIN AND HEMATOCRIT, BLOOD
HCT: 37.2 % — ABNORMAL LOW (ref 39.0–52.0)
Hemoglobin: 12.1 g/dL — ABNORMAL LOW (ref 13.0–17.0)

## 2022-12-30 NOTE — Progress Notes (Signed)
Mobility Specialist - Progress Note   12/30/22 0845  Mobility  Activity Ambulated independently in hallway  Level of Assistance Independent  Assistive Device None  Distance Ambulated (ft) 500 ft  Range of Motion/Exercises Active  Activity Response Tolerated well  Mobility Referral Yes  $Mobility charge 1 Mobility  Mobility Specialist Start Time (ACUTE ONLY) [id number]  Mobility Specialist Stop Time (ACUTE ONLY) 0844  Mobility Specialist Time Calculation (min) (ACUTE ONLY) 10 min   Pt was found in bed and agreeable to ambulate. Had no complaints during session and at EOS returned to recliner chair. RN and NT in room.  Billey Chang Mobility Specialist

## 2022-12-30 NOTE — Progress Notes (Signed)
1 Day Post-Op Subjective: NAEO. Pain adequately controlled. No flatus.  Objective: Vital signs in last 24 hours: Temp:  [97.6 F (36.4 C)-100.3 F (37.9 C)] 98.2 F (36.8 C) (06/13 0515) Pulse Rate:  [64-89] 64 (06/13 0515) Resp:  [10-20] 18 (06/13 0515) BP: (115-165)/(66-95) 132/67 (06/13 0515) SpO2:  [92 %-100 %] 94 % (06/13 0515) Weight:  [119.3 kg] 119.3 kg (06/12 1640)  Intake/Output from previous day: 06/12 0701 - 06/13 0700 In: 2735.7 [I.V.:2635.7; IV Piggyback:100] Out: 1245 [Urine:1075; Blood:170] Intake/Output this shift: No intake/output data recorded.  Physical Exam:  General: Alert and oriented CV: RRR Lungs: Clear Abdomen: Soft, ND Incisions: clean, dry, intact GU: foley draining clear yellow Ext: NT, No erythema  Lab Results: Recent Labs    12/29/22 1242 12/30/22 0524  HGB 13.0 12.1*  HCT 40.7 37.2*   BMET Recent Labs    12/30/22 0524  NA 133*  K 4.0  CL 100  CO2 23  GLUCOSE 115*  BUN 26*  CREATININE 2.78*  CALCIUM 8.7*     Studies/Results: No results found.  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

## 2022-12-30 NOTE — Progress Notes (Signed)
Transition of Care [facility]) - Inpatient Brief Assessment   Patient Details  Name: Marin Milley MRN: [id number] Date of Birth: 12/24/1965  [facility] of [facility]) CM/SW Contact:    Adrian Prows, RN Phone Number: 12/30/2022, 2:59 PM   Clinical Narrative: Brief assessment completed; pt identfied POC Kia Toodle ([place]) [phone].   Transition of Care Asessment: Insurance and Status: Insurance coverage has been reviewed Patient has primary care physician: Yes Home environment has been reviewed: safe Prior level of function:: independent Prior/Current Home Services: No current home services Social Determinants of Health Reivew: SDOH reviewed no interventions necessary Readmission risk has been reviewed: Yes Transition of care needs: no transition of care needs at this time

## 2022-12-30 NOTE — Plan of Care (Signed)
  Problem: Education: Goal: Knowledge of the prescribed therapeutic regimen will improve Outcome: Progressing   Problem: Bowel/Gastric: Goal: Gastrointestinal status for postoperative course will improve Outcome: Progressing   Problem: Clinical Measurements: Goal: Postoperative complications will be avoided or minimized Outcome: Progressing   Problem: Education: Goal: Knowledge of General Education information will improve Description: Including pain rating scale, medication(s)/side effects and non-pharmacologic comfort measures Outcome: Progressing   Problem: Activity: Goal: Risk for activity intolerance will decrease Outcome: Progressing   Problem: Pain Managment: Goal: General experience of comfort will improve Outcome: Progressing

## 2022-12-31 LAB — BASIC METABOLIC PANEL
Anion gap: 9 (ref 5–15)
BUN: 31 mg/dL — ABNORMAL HIGH (ref 6–20)
CO2: 23 mmol/L (ref 22–32)
Calcium: 8.8 mg/dL — ABNORMAL LOW (ref 8.9–10.3)
Chloride: 101 mmol/L (ref 98–111)
Creatinine, Ser: 3.22 mg/dL — ABNORMAL HIGH (ref 0.61–1.24)
GFR, Estimated: 21 mL/min — ABNORMAL LOW (ref >60)
Glucose, Bld: 102 mg/dL — ABNORMAL HIGH (ref 70–99)
Potassium: 4.1 mmol/L (ref 3.5–5.1)
Sodium: 133 mmol/L — ABNORMAL LOW (ref 135–145)

## 2022-12-31 LAB — SURGICAL PATHOLOGY

## 2022-12-31 MED ORDER — BISACODYL 10 MG RE SUPP
10.0000 mg | Freq: Once | RECTAL | Status: AC
  Administered 2022-12-31: 10 mg via RECTAL
  Filled 2022-12-31: qty 1

## 2022-12-31 NOTE — Plan of Care (Signed)
  Problem: Education: Goal: Knowledge of the prescribed therapeutic regimen will improve Outcome: Progressing   Problem: Clinical Measurements: Goal: Postoperative complications will be avoided or minimized Outcome: Progressing   Problem: Respiratory: Goal: Ability to achieve and maintain a regular respiratory rate will improve Outcome: Progressing   Problem: Urinary Elimination: Goal: Ability to avoid or minimize complications of infection will improve Outcome: Progressing

## 2022-12-31 NOTE — Discharge Summary (Signed)
Date of admission: 12/29/2022  Date of discharge: 12/31/2022  Admission diagnosis: Renal mass  Discharge diagnosis: Renal mass  Secondary diagnoses: none  History and Physical: For full details, please see admission history and physical. Briefly, Mike Cruz is a 57 y.o. year old patient with left renal mass.   Hospital Course: Patient underwent robotic assisted laparoscopic left partial nephrectomy converted to radical nephrectomy for multiple left renal masses. He tolerated the procedure well and post-operatively was transferred to the floor for routine post-operative care. His hospitalization was uneventful. Patient passed a TOV on POD#1 and was ambulating. Labs were notable for Cr 2.78 (b/l ~1.8-1.9) and Hgb 12.1 from 13.0. He required an additional night of observation for pain control. By POD#2 his pain was well-controlled and he was cleared for discharge. Cr had increased from 2.78 to 3.22 at the time of discharge.  Laboratory values:  Recent Labs    12/29/22 1242 12/30/22 0524  HGB 13.0 12.1*  HCT 40.7 37.2*   Recent Labs    12/30/22 0524 12/31/22 0504  CREATININE 2.78* 3.22*    Disposition: Home  Discharge instruction: The patient was instructed to be ambulatory but told to refrain from heavy lifting, strenuous activity, or driving.   Discharge medications:  Allergies as of 12/31/2022       Reactions   Penicillins Hives   Denies airway involvement        Medication List     STOP taking these medications    Multivitamin Adults Tabs       TAKE these medications    docusate sodium 100 MG capsule Commonly known as: COLACE Take 1 capsule (100 mg total) by mouth 2 (two) times daily.   Farxiga 10 MG Tabs tablet Generic drug: dapagliflozin propanediol Take 10 mg by mouth daily.   HYDROcodone-acetaminophen 5-325 MG tablet Commonly known as: Norco Take 1-2 tablets by mouth every 6 (six) hours as needed for moderate pain or severe pain.    Olmesartan-amLODIPine-HCTZ 40-5-12.5 MG Tabs Take 1 tablet by mouth every morning.        Followup:   Follow-up Information     Berneice Heinrich Delbert Phenix., MD Follow up on 01/17/2023.   Specialty: Urology Why: at 2:15 for MD visit and office pathology review Contact information: [address] [place] [address] [phone]

## 2023-02-22 ENCOUNTER — Ambulatory Visit (INDEPENDENT_AMBULATORY_CARE_PROVIDER_SITE_OTHER): Payer: BC Managed Care – PPO | Admitting: Internal Medicine

## 2023-02-22 ENCOUNTER — Encounter: Payer: Self-pay | Admitting: Internal Medicine

## 2023-02-22 VITALS — BP 110/70 | HR 72 | Ht 69.0 in | Wt 232.8 lb

## 2023-02-22 DIAGNOSIS — L03012 Cellulitis of left finger: Secondary | ICD-10-CM

## 2023-02-22 DIAGNOSIS — C642 Malignant neoplasm of left kidney, except renal pelvis: Secondary | ICD-10-CM

## 2023-02-22 DIAGNOSIS — Z905 Acquired absence of kidney: Secondary | ICD-10-CM

## 2023-02-22 DIAGNOSIS — N189 Chronic kidney disease, unspecified: Secondary | ICD-10-CM | POA: Diagnosis not present

## 2023-02-22 DIAGNOSIS — D869 Sarcoidosis, unspecified: Secondary | ICD-10-CM

## 2023-02-22 NOTE — Addendum Note (Signed)
Addended by: Hedda Slade on: 02/22/2023 03:06 PM   Modules accepted: Orders

## 2023-02-22 NOTE — Addendum Note (Signed)
Addended by: Hedda Slade on: 02/22/2023 03:02 PM   Modules accepted: Orders

## 2023-02-22 NOTE — Progress Notes (Signed)
IOV 11/22/2016  Chief Complaint  Patient presents with   Pulmonary Consult    Pt referred by Dr. Kevan Ny for sarcoid. Pt c/o SOB with activity x 9 months. Pt states he was recently hospitalized for BIL pna. Pt c/o non prod cough, chest congstion. Pt denies f/c/s and CP/tightness.     57 year old male. He is a Data processing manager at [employer]. 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 He tells me that starting July August 2017 started noticing left-sided cervical adenopathy associated with some night sweats and mild shortness of breath and fatigue. Workup in August 2017 : 03/11/2016 had CT chest and abdomen pelvis: I personally visualized this film and it is significant for diffuse adenopathy in the mediastinal and abdominal areas. 03/12/2016 the following day he underwent cervical lymph node biopsy by Dr. Brynda Peon and this showed granulomatous lymphadenitis.. Tthis led to the diagnosis of sarcoidosis. On the CT chest as pulmonary lung fields had just some scattered mild nodularity which is reflected on the chest x-ray from 10/19/2016 which I personally visualized that shows just mild interstitial prominence c/w aug 2017 films and improved from pneumonia admit march 2018. Then in September 2017 he was started on prednisone. By October November 2017 due to lack of resolution of the symptoms and the lymph node area he was referred to Dr.  Kellie Simmering rheumatologist   (since retired) and was started on methotrexate. Patient states that he is on 10 mg of methotrexate on Thursday once weekly and 10 mg of methotrexate on Friday once weekly totaling over 20 mg of methotrexate weekly. He is on folic acid. With this he did improve upon his symptoms with the fatigue and shortness of breath and the lymphadenopathy. However he has not had follow-up CT imaging of his abdomen. Then in March 2018 was diagnosed with rhinovirus respiratory infection pneumonia and was admitted. HIV test was negative at  that time. He did have acute kidney injury which seemed to be improving as of 10/03/2016 but no follow-up labs since then on the computer. He also seems to have chronic anemia. CXR shows resultion April 2018. Echocardiogram 10/01/2016: shows left ventricle ejection fraction 55% with grade 1 diastolic dysfunction  Currently he has improved in terms of his chest x-ray. He still has some post pneumonia fatigue but otherwise is feeling stable.  He particularly denies any clinical diagnosis of sarcoidosis in his brain or eyes or skin or renal system although he is known to have high elevated creatinine for a few years which has been presumed due to hypertension. He has never seen a nephrologist.    OV 08/26/2017  Chief Complaint  Patient presents with   Follow-up    sarcoidosis- feeling good slight congestion, getting over a cold    Follow-up sarcoidosis diagnosed in 2017 in the form of diffuse mediastinal and abdominal lymphadenopathy and cervical lymphadenopathy with some pulmonary infiltrates.  Started sometime in  fall 2017 methotrexate and prednisone  I personally saw him in May 2018.  At the time he was on 20 mg of methotrexate a week.  I reduce it to 10 mg/week.  I had him continue his daily prednisone.  Since that time he has had a partial left nephrectomy because of my referral to nephrologist because of increased creatinine.  This was done towards the end of 2018.  He is also seen Dr. Corliss Skains rheumatologist.  She has him now tapering on prednisone.  He is essentially asymptomatic from a respiratory standpoint.  2 days ago he picked up a cold but he is recovering well from that with Alka-Seltzer.  There are no other new issues no shortness of breath wheezing or cough or chills or B symptoms.  Latest creatinine shows chronic kidney insufficiency but this being followed by Dr. Bufford Buttner   OV 12/22/2017  Chief Complaint  Patient presents with   Follow-up    Breathing is improved since  last OV. Denies issues with SOB, chest tightness, wheezing or coughing.    Follow-up sarcoidosis diagnosed in 2017 in the form of diffuse mediastinal and abdominal lymphadenopathy and cervical lymphadenopathy with some pulmonary infiltrates.  Started sometime in  fall 2017 methotrexate and prednisone. Off prednisone as of June 2019 and on methotrexate 7.5mg  per week in June 2019.  Status post left partial nephrectomy for renal cell carcinoma April 20, 2017  Since last visit with me earlier in the year he is doing well.  Denies any respiratory symptoms or shortness of breath cough wheezing orthopnea proximal nocturnal dyspnea hemoptysis edema fever chills.  In addition he also denies any B symptoms with suggest nocturnal sweats.  He visited Dr. D rheumatologist in March 2019 and has been tapered off prednisone.  He is on a lower dose of methotrexate 7.5 mg once a week along with daily folic acid.  Her primary function test today that shows significant improvement in lung function.  Last chest x-ray was in April 2018 that was essentially clear except for some upper lobe infiltrates.  At this point is wondering if he can make further inroads into tapering his methotrexate.  Recent lab work is all characterized below and the look normal     Results for CROWLEY, KRASNER (MRN [id number]) as of 12/22/2017 14:53  Ref. Range 12/10/2016 16:07 12/22/2017 13:50  FVC-Pre Latest Units: L 3.68 4.17  FVC-%Pred-Pre Latest Units: % 94 108  Results for MAKYE, CENTANNI (MRN [id number]) as of 12/22/2017 14:53  Ref. Range 12/10/2016 16:07 12/22/2017 13:50  DLCO cor Latest Units: ml/min/mmHg 20.47 27.74  DLCO cor % pred Latest Units: % 68 92   Results for BEREN, LINNEMANN (MRN [id number]) as of 12/22/2017 14:53  Ref. Range 11/24/2017 14:08  Creatinine Latest Ref Range: 0.70 - 1.33 mg/dL 0.86 (H)  Results for COUPER, JOYAL (MRN [id number]) as of 12/22/2017 14:53  Ref. Range 11/24/2017 14:08  Hemoglobin Latest Ref Range: 13.2 - 17.1 g/dL  52.8 (L)  Results for TI, PELKEY (MRN [id number]) as of 12/22/2017 14:53  Ref. Range 11/24/2017 14:08  WBC Latest Ref Range: 3.8 - 10.8 Thousand/uL 6.5  Results for EAGLE, KILDUFF (MRN [id number]) as of 12/22/2017 14:53  Ref. Range 11/24/2017 14:08  AST Latest Ref Range: 10 - 35 U/L 26  ALT Latest Ref Range: 9 - 46 U/L 23     OV 03/22/2018  Subjective:  Patient ID: Mike Cruz, male , DOB: June 06, 1966 , age MRN: [id number] , ADDRESS: [address] [place] [address]   03/22/2018 -   Chief Complaint  Patient presents with   Follow-up    Pt states he has been doing well since last visit and denies any complaints.    Follow-up sarcoidosis diagnosed in 2017 in the form of diffuse mediastinal and abdominal lymphadenopathy and cervical lymphadenopathy with some pulmonary infiltrates.  Started sometime in  fall 2017 methotrexate and prednisone. Off prednisone as of June 2019 and on methotrexate 7.5mg  per week in June 2019.  Status post left partial nephrectomy for renal cell carcinoma April 20, 2017 .  REduced methotrxate to 5mg  per day June 2019. Off Mtx since leate 2019/early 2020     Achilleus Vanarsdall 57 y.o. -continues to do well. He is asymptomatic. In August 2019 he had workup for potential bone lesions according to his history and chart review. 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 During this time he had a CT chest which shows improved medicine lymphadenopathy very small levels and also multiple pulmonary nodules that are really small. One-year follow-up CT is recommended.He is currently on methotrexate 5 mg once a week along with folic acid.. I personally visualized the CT chest from August 2019     ROS   OV 03/27/2020  Subjective:  Patient ID: Mike Cruz, male , DOB: 08/24/1965 , age 33 y.o. , MRN: [id number] , ADDRESS: [address] [place] [address]   03/27/2020 -   Chief Complaint  Patient presents with   Follow-up    here to go over pft results        HPI Elger Costas 57 y.o. -presents for follow-up.  Last seen in September 2019.  After that not seen in September 2020 because of the pandemic.  However in September 2020 he did have a CT scan of the chest that showed continued response and nodules and mediastinal adenopathy.  Today he tells me has been off methotrexate since late 2019/early 2020.  He last saw Dr. Algis Downs in March 2021 and was noted that he was off methotrexate for over a year.  She also supported clinical follow-up.  He is feeling fine without any respiratory symptoms.  No B symptoms.  No abdominal symptoms.  He has had colonoscopy.  He has had his Covid vaccine.  However he is not had his flu shot.  He wants to have high-dose flu shot which we do not have in stock.  He is not had shingles vaccine but he said he will talk to his primary care physician.  He is interested in the booster vaccine for Covid as and when it is recommended for his age group.  Currently he is not immunosuppressed and therefore he does not qualify although if he wanted he can get it by directly communicating with the pharmacy.  He is aware of this.    OV 02/22/2023  Subjective:  Patient ID: Mike Cruz, male , DOB: Nov 06, 1965 , age 54 y.o. , MRN: [id number] , ADDRESS: [address] [place] [address] PCP Collene Mares, [place] Patient Care Team: Collene Mares, [place] as PCP - General (Internal Medicine) Pollyann Savoy, MD as Consulting Physician (Rheumatology)  This Provider for this visit: Treatment Team:  Attending Provider: Kalman Shan, MD  Follow-up sarcoidosis diagnosed in 2017 in the form of diffuse mediastinal and abdominal lymphadenopathy and cervical lymphadenopathy with some pulmonary infiltrates.   0 Started sometime in  fall 2017 methotrexate and prednisone. - Off prednisone as of June 2019 and on methotrexate 7.5mg  per week in June 2019.  ->  . REduced methotrxate to 5mg  per day June 2019.  Off methotrexate late  2019/early 2020  Status post left partial nephrectomy for renal cell carcinoma April 20, 2017; complete nephrectomy June 2024 for recurrence  02/22/2023 -   Chief Complaint  Patient presents with   Follow-up    F/up on PFT, no complaints     HPI Tarrance Jagiello 57 y.o. -returns for follow-up.  At present have not seen him since 2021.  Next month will be 3 years since I saw him  but this is less than 3 years in the fourth established follow-up visit.  He tells me in June 2024 he had a local recurrence and Dr. Dwana Curd did a full nephrectomy.  There were no local tumor recurrence.  He believes he is in complete remission but he did indicate the normal CT scan of the chest was done.  He did have a CT abdomen that I personally visualized.  This was done in February 2024.  The lower lung fields are reported as clear.  Subsequently had an MRI of the abdomen.  However since the summa in the spring 2024 is not had any chest imaging.  Even though he is asymptomatic from a respiratory standpoint he does feel the need to get a CT scan of the chest because of prior sarcoid and also the potential for renal cell carcinoma lung metastasis.  Of note he tells me that for the last 3 weeks he has noticed a skin lesion in his left index finger.  Looks like a boil.  Probably consistent with paronychia.  There is no fever or purulence.   Also in June 2020 for after the surgery his creatinine was 3.2 mg percent.  He was also little bit anemic.  He says he did not do labs with primary care physician but does not know the details.  He is open to getting it retested here today.   PFT     Latest Ref Rng & Units 07/23/2022    4:20 PM 03/26/2020    4:01 PM 12/22/2017    1:50 PM 12/10/2016    4:07 PM  ILD indicators  FVC-Pre L 3.21  4.23  4.17  3.68   FVC-Predicted Pre % 70  110  108  94   FVC-Post L    3.79   FVC-Predicted Post %    97   TLC L    5.22   TLC Predicted %    78   DLCO uncorrected ml/min/mmHg 24.20  24.21   24.86  19.07   DLCO UNC %Pred % 91  90  82  63   DLCO Corrected ml/min/mmHg 24.20  24.21  27.74  20.47   DLCO COR %Pred % 91  90  92  68       LAB RESULTS last 96 hours No results found.  LAB RESULTS last 90 days Recent Results (from the past 2160 hour(s))  Basic metabolic panel per protocol     Status: Abnormal   Collection Time: 12/15/22  9:00 AM  Result Value Ref Range   Sodium 140 135 - 145 mmol/L   Potassium 3.7 3.5 - 5.1 mmol/L   Chloride 107 98 - 111 mmol/L   CO2 25 22 - 32 mmol/L   Glucose, Bld 113 (H) 70 - 99 mg/dL    Comment: Glucose reference range applies only to samples taken after fasting for at least 8 hours.   BUN 26 (H) 6 - 20 mg/dL   Creatinine, Ser 4.69 (H) 0.61 - 1.24 mg/dL   Calcium 9.5 8.9 - 62.9 mg/dL   GFR, Estimated 39 (L) >60 mL/min    Comment: (NOTE) Calculated using the CKD-EPI Creatinine Equation (2021)    Anion gap 8 5 - 15    Comment: Performed at [facility], [address] [place], [place] [address]  CBC per protocol     Status: None   Collection Time: 12/15/22  9:00 AM  Result Value Ref Range   WBC 7.5 4.0 -  10.5 K/uL   RBC 4.52 4.22 - 5.81 MIL/uL   Hemoglobin 13.2 13.0 - 17.0 g/dL   HCT 88.4 16.6 - 06.3 %   MCV 90.3 80.0 - 100.0 fL   MCH 29.2 26.0 - 34.0 pg   MCHC 32.4 30.0 - 36.0 g/dL   RDW 01.6 01.0 - 93.2 %   Platelets 159 150 - 400 K/uL   nRBC 0.0 0.0 - 0.2 %    Comment: Performed at [facility], [address] [place], [place] [address]  Type and screen     Status: None   Collection Time: 12/15/22  9:00 AM  Result Value Ref Range   ABO/RH(D) B POS    Antibody Screen NEG    Sample Expiration 12/29/2022,2359    Extend sample reason      NO TRANSFUSIONS OR PREGNANCY IN THE PAST 3 MONTHS Performed at [facility], [address] [place], [place] [address]   Surgical pathology     Status: None   Collection Time: 12/29/22  9:30 AM  Result Value Ref Range   SURGICAL  PATHOLOGY      SURGICAL PATHOLOGY CASE: [id number] PATIENT: Wasim Longstreth Surgical Pathology Report     Clinical History: Left renal mass; history of prior left partial nephrectomy (crm)     FINAL MICROSCOPIC DIAGNOSIS:  A. 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, type2) Sarcomatoid Features: Negative Rhabdoid Features: Negative Histologic Grade (WHO/ISUP grade): G2 Tumo r Necrosis: Negative Tumor Extension: Organ confined Lymphovascular invasion: Negative Margins: Negative Regional Lymph Nodes:      No lymph nodes submitted or found: x      Number of Lymph Nodes Involved: NA      Number of Lymph Nodes Examined: NA Distant Metastasis: NA Pathologic Stage Classification (pTNM, AJCC 8th Edition):  pT1a (m2), pN Pathologic Findings in Nonneoplastic Kidney: Papillary adenomas, simple cyst and mild tubular interstitial fibrosis Representative tumor block: A6 Comment(s): The neoplasm composed of papillary architecture with clear cell and eosinophilic neoplastic epithelium lining (previous category of papillary renal cell carcinoma, type 2), further molecular subclassification can be done if clinically indicated.  (v4.1.0.0)  GROSS DESCRIPTION:  Specimen: Left radical nephrectomy including attached focally disrupted adrenal gland, portion of ureter, and perirenal fat,  received fresh. External surface: Fat overlying the anterior aspect  is diffusely roughened, indurated and adherent to the renal capsule.  Sectioning through this area is a 1.4 cm in diameter cavitary defect in the mid lateral kidney, consistent with area of prior surgery. Weight: 144 g after removal of adrenal gland, portion of ureter and perirenal fat. Size (cm): 10 x 5.5 x 5 cm Location of tumor: Superior pole, periphery Size of tumor: 2.5 x 2.4 x 2.2 cm yellow-pink to red soft well-defined mass. Satellite nodules/masses: Found within 0.6 and 1.5 cm of the predominant mass are 2 soft to firm gray-white well-defined nodules/masses, 0.5 and 0.7 cm. Renal capsule: The mass abuts but does not grossly involve renal capsule. Renal  vein: Patent, grossly free of mass lesions. Renal pelvis and ureter: The portion of ureter is 5.8 cm in length and averages 0.4 cm in diameter.  Renal pelvis and ureter have pink-white smooth linings with no discrete mass lesions. Renal sinus: Grossly free of lesions. Non-neoplastic renal parenchyma: Pink-brow n, smooth with distinct corticomedullary junctions.  Within the inferior pole is a 0.8 cm clear fluid-filled smooth parenchymal cyst. Adrenal gland: The portion of adrenal gland is focally disrupted, 6.6 x 3.3 cm and ranges from 0.3 to 0.8 cm thick.  There are 2 stapled areas. Cut surfaces have a yellow gold periphery with dark red-brown center. There is also a 0.8 cm well-defined bright yellow nodular area. Lymph nodes: Not identified Block Summary: Block 1 = ureter and vascular margins Blocks 2, 3 = predominant mass with overlying renal capsule Blocks 4-6 = predominant mass and adjacent parenchyma Blocks 7, 8 = satellite nodules/masses Block 9 = cavitary area consistent with previous surgery Block 10 = sections of renal pelvis and renal sinus nearest predominant mass Block 11 = section of parenchyma away from mass, including cyst within the  inferior pole Block 12 = representative sections of adrenal gland including well-defined nodular area  SW 12/30/2022    F inal Diagnosis performed by Marlena Clipper, MD.   Electronically signed 12/31/2022 Technical and / or Professional components performed at [facility], [address] [place], [place] [address].  Immunohistochemistry Technical component (if applicable) was performed at [facility]. [address], [place], [place] [address].   IMMUNOHISTOCHEMISTRY DISCLAIMER (if applicable): Some of these immunohistochemical stains may have been developed and the performance characteristics determine by [facility]. Some may not have been cleared or approved by the U.S. Food and Drug Administration. The FDA has determined that such clearance or approval is not necessary. This test is used for clinical purposes. It should not be regarded as investigational or for research. This laboratory is certified under the Clinical Laboratory Improvement Amendments of 1988 (CLIA-88) as qualified to perform high complexity clinical laboratory t esting.  The controls stained appropriately.   IHC stains are performed on formalin fixed, paraffin embedded tissue using a 3,3"diaminobenzidine (DAB) chromogen and Leica Bond Autostainer System. The staining intensity of the nucleus is score manually and is reported as the percentage of tumor cell nuclei demonstrating specific nuclear staining. The specimens are fixed in 10% Neutral Formalin for at least 6 hours and up to 72hrs. These tests are validated on decalcified tissue. Results should be interpreted with caution given the possibility of false negative results on decalcified specimens. Antibody Clones are as follows ER-clone 9F, PR-clone 16, Ki67- clone MM1. Some of these immunohistochemical stains may have been developed and the performance characteristics determined by [facility]  Pathology.   Hemoglobin and hematocrit, blood     Status: None   Collection Time: 12/29/22 12:42 PM  Result Value Ref Range   Hemoglobin 13.0 13.0 - 17.0 g/dL   HCT 09.8 11.9 - 14.7 %    Comment: Performed at [facility], [address] [place], [place] [address]  Basic metabolic panel     Status: Abnormal   Collection Time: 12/30/22  5:24 AM  Result Value Ref Range   Sodium 133 (L) 135 - 145 mmol/L   Potassium 4.0 3.5 - 5.1 mmol/L   Chloride 100 98 - 111 mmol/L   CO2 23 22 - 32 mmol/L   Glucose, Bld 115 (H) 70 - 99 mg/dL  Comment: Glucose reference range applies only to samples taken after fasting for at least 8 hours.   BUN 26 (H) 6 - 20 mg/dL   Creatinine, Ser 8.29 (H) 0.61 - 1.24 mg/dL   Calcium 8.7 (L) 8.9 - 10.3 mg/dL   GFR, Estimated 25 (L) >60 mL/min    Comment: (NOTE) Calculated using the CKD-EPI Creatinine Equation (2021)    Anion gap 10 5 - 15    Comment: Performed at [facility], [address], [place], [place] [address]  Hemoglobin and hematocrit, blood     Status: Abnormal   Collection Time: 12/30/22  5:24 AM  Result Value Ref Range   Hemoglobin 12.1 (L) 13.0 - 17.0 g/dL   HCT 08.6 (L) 57.8 - 46.9 %    Comment: Performed at [facility], [address] [place], [place] [address]  Glucose, capillary     Status: None   Collection Time: 12/30/22  8:08 PM  Result Value Ref Range   Glucose-Capillary 92 70 - 99 mg/dL    Comment: Glucose reference range applies only to samples taken after fasting for at least 8 hours.   Comment 1 Notify RN   Basic metabolic panel     Status: Abnormal   Collection Time: 12/31/22  5:04 AM  Result Value Ref Range   Sodium 133 (L) 135 - 145 mmol/L   Potassium 4.1 3.5 - 5.1 mmol/L   Chloride 101 98 - 111 mmol/L   CO2 23 22 - 32 mmol/L   Glucose, Bld 102 (H) 70 - 99 mg/dL    Comment: Glucose reference range applies only to samples taken after fasting for at least 8 hours.    BUN 31 (H) 6 - 20 mg/dL   Creatinine, Ser 8.41 (H) 0.61 - 1.24 mg/dL   Calcium 8.8 (L) 8.9 - 10.3 mg/dL   GFR, Estimated 21 (L) >60 mL/min    Comment: (NOTE) Calculated using the CKD-EPI Creatinine Equation (2021)    Anion gap 9 5 - 15    Comment: Performed at [facility], [address], [place], [place] [address]         has a past medical history of Acute renal failure (ARF) (HCC), Acute respiratory failure (HCC), Anemia, Cancer (HCC), Cancer of kidney, left (HCC) (01/2021), Family history of colon cancer, Family history of kidney cancer, Family history of ovarian cancer, Family history of prostate cancer, Family history of stomach cancer, History of kidney stones, Hypertension, Hyponatremia, Kidney stone, Pneumonia, and Sarcoidosis.   reports that he has never smoked. He has been exposed to tobacco smoke. He has never used smokeless tobacco.  Past Surgical History:  Procedure Laterality Date   EXTRACORPOREAL SHOCK WAVE LITHOTRIPSY Left 05/03/2022   Procedure: LEFT EXTRACORPOREAL SHOCK WAVE LITHOTRIPSY (ESWL);  Surgeon: Crist Fat, MD;  Location: [facility];  Service: Urology;  Laterality: Left;   HEAD & NECK SKIN LESION EXCISIONAL BIOPSY     KNEE SURGERY Left    Arthroscopic   partial nephrectomt     Left 04/20/17 Dr. Berneice Heinrich   ROBOTIC ASSITED PARTIAL NEPHRECTOMY Left 04/20/2017   Procedure: XI ROBOTIC ASSITED PARTIAL NEPHRECTOMY;  Surgeon: Sebastian Ache, MD;  Location: [facility];  Service: Urology;  Laterality: Left;   ROBOTIC ASSITED PARTIAL NEPHRECTOMY Left 12/29/2022   Procedure: XI ROBOTIC ASSITED RADICAL NEPHRECTOMY WITH INTRAOPERATIVE ULTRASOUND;  Surgeon: Loletta Parish., MD;  Location: [facility];  Service: Urology;  Laterality: Left;  3HRS  Allergies  Allergen Reactions   Penicillins Hives    Denies airway involvement    Immunization History  Administered Date(s) Administered   Influenza Split 04/21/2017, 07/02/2022    Influenza,inj,Quad PF,6+ Mos 03/30/2016, 04/18/2016, 04/21/2017, 03/22/2018, 07/10/2021   Influenza-Unspecified 04/04/2019   Moderna Covid-19 Vaccine Bivalent Booster 49yrs & up 07/10/2021   Moderna SARS-COV2 Booster Vaccination 05/15/2020, 01/07/2021   Moderna Sars-Covid-2 Vaccination 08/30/2019, 10/02/2019, 04/25/2020, 07/02/2022   Tdap 03/01/2019    Family History  Problem Relation Age of Onset   Ovarian cancer Mother 51   AAA (abdominal aortic aneurysm) Father    Prostate cancer Father 66   Kidney cancer Father 29       was 2 separate primaries, not a met   Stroke Sister 45   Healthy Daughter    Healthy Daughter    Healthy Son    Lung cancer Maternal Aunt    Stomach cancer Maternal Grandmother 30   Colon cancer Maternal Grandmother 75   Prostate cancer Maternal Grandfather 65       metastatic   Lymphoma Cousin    Prostate cancer Cousin 50     Current Outpatient Medications:    FARXIGA 10 MG TABS tablet, Take 10 mg by mouth daily., Disp: , Rfl:    Olmesartan-Amlodipine-HCTZ 40-5-12.5 MG TABS, Take 1 tablet by mouth every morning., Disp: , Rfl: 3   docusate sodium (COLACE) 100 MG capsule, Take 1 capsule (100 mg total) by mouth 2 (two) times daily. (Patient not taking: Reported on 02/22/2023), Disp: , Rfl:    HYDROcodone-acetaminophen (NORCO) 5-325 MG tablet, Take 1-2 tablets by mouth every 6 (six) hours as needed for moderate pain or severe pain. (Patient not taking: Reported on 02/22/2023), Disp: 20 tablet, Rfl: 0      Objective:   Vitals:   02/22/23 1413  BP: 110/70  Pulse: 72  SpO2: 98%  Weight: 232 lb 12.8 oz (105.6 kg)  Height: 5\' 9"  (1.753 m)    Estimated body mass index is 34.38 kg/m as calculated from the following:   Height as of this encounter: 5\' 9"  (1.753 m).   Weight as of this encounter: 232 lb 12.8 oz (105.6 kg).  @WEIGHTCHANGE @  [employer]   02/22/23 1413  Weight: 232 lb 12.8 oz (105.6 kg)     Physical Exam   General: No distress.  Look s well O2 at rest: no Cane present: no Sitting in wheel chair: no Frail: no Obese: no Neuro: Alert and Oriented x 3. GCS 15. Speech normal Psych: Pleasant Resp:  Barrel Chest - no.  Wheeze - no, Crackles - no, No overt respiratory distress CVS: Normal heart sounds. Murmurs - no Ext: Stigmata of Connective Tissue Disease - no HEENT: Normal upper airway. PEERL +. No post nasal drip        Assessment:       ICD-10-CM   1. Sarcoidosis  D86.9     2. 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 Renal cell carcinoma of left kidney (HCC)  C64.2     4. Chronic kidney disease, unspecified CKD stage  N18.9     5. Paronychia of finger of left hand  L03.012          Plan:     Patient Instructions     ICD-10-CM   1. Sarcoidosis  D86.9     2. 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 Renal cell carcinoma of left kidney (HCC)  C64.2  4. Chronic kidney disease, unspecified CKD stage  N18.9     5. Paronychia of finger of left hand  L03.012      #Sarcoidosis - CT chest aug 2019 compared to aug 2017 through sept 2020 shows improved response in sarcoidosis but last CT scan was in 2020.  Since then he had total nephrectomy in 2024 for renal cell counts are normal.   Plan - Monitor off methotrexate and prednisone clinically -Get high-resolution CT chest without contrast  #Chronic kidney disease # History of anemia in June 2024  -Creatinine 3.2 mg percent 2 days after nephrectomy in June 2024  Plan - Check CBC and chemistry  #Paronychia of the left index finger  Plan - warm soaks 15 min x 4 times daily - neosporin ointment after each soak - If it does not go away in a few days please call your primary care doctor  Followup 2 weeks video with  or APP to discuss results   FOLLOWUP Return in about 2 weeks (around 03/08/2023) for 30 min visit, after HRCT chest, with Dr Marchelle Gearing, with any of the APPS, Face to Face OR Video Visit.    SIGNATURE     Dr. Kalman Shan, M.D., F.C.C.P,  Pulmonary and Critical Care Medicine Staff Physician, [facility] Director - Interstitial Lung Disease  Program  Pulmonary Fibrosis [facility] [place], [place], [address]  Pager: [phone], If no answer or between  15:00h - 7:00h: call [phone] Telephone: [phone]  2:52 PM 02/22/2023   Moderate Complexity MDM OFFICE  2021 E/M guidelines, first released in 2021, with minor revisions added in 2023 and 2024 Must meet the requirements for 2 out of 3 dimensions to qualify.    Number and complexity of problems addressed Amount and/or complexity of data reviewed Risk of complications and/or morbidity  One or more chronic illness with mild exacerbation, OR progression, OR  side effects of treatment  Two or more stable chronic illnesses  One undiagnosed new problem with uncertain prognosis  One acute illness with systemic symptoms   One Acute complicated injury Must meet the requirements for 1 of 3 of the categories)  Category 1: Tests and documents, historian  Any combination of 3 of the following:  Assessment requiring an independent historian  Review of prior external note(s) from each unique source  Review of results of each unique test  Ordering of each unique test    Category 2: Interpretation of tests   Independent interpretation of a test performed by another physician/other qualified health care professional (not separately reported)  Category 3: Discuss management/tests  Discussion of management or test interpretation with external physician/other qualified health care professional/appropriate source (not separately reported) Moderate risk of morbidity from additional diagnostic testing or treatment Examples only:  Prescription drug management  Decision regarding minor surgery with identfied patient or procedure risk factors  Decision regarding  elective major surgery without identified patient or procedure risk factors  Diagnosis or treatment significantly limited by social determinants of health             HIGh Complexity  OFFICE   2021 E/M guidelines, first released in 2021, with minor revisions added in 2023. Must meet the requirements for 2 out of 3 dimensions to qualify.    Number and complexity of problems addressed Amount and/or complexity of data reviewed Risk of complications and/or morbidity  Severe exacerbation of chronic illness  Acute or  chronic illnesses that may pose a threat to life or bodily function, e.g., multiple trauma, acute MI, pulmonary embolus, severe respiratory distress, progressive rheumatoid arthritis, psychiatric illness with potential threat to self or others, peritonitis, acute renal failure, abrupt change in neurological status Must meet the requirements for 2 of 3 of the categories)  Category 1: Tests and documents, historian  Any combination of 3 of the following:  Assessment requiring an independent historian  Review of prior external note(s) from each unique source  Review of results of each unique test  Ordering of each unique test    Category 2: Interpretation of tests    Independent interpretation of a test performed by another physician/other qualified health care professional (not separately reported)  Category 3: Discuss management/tests  Discussion of management or test interpretation with external physician/other qualified health care professional/appropriate source (not separately reported)  HIGH risk of morbidity from additional diagnostic testing or treatment Examples only:  Drug therapy requiring intensive monitoring for toxicity  Decision for elective major surgery with identified pateint or procedure risk factors  Decision regarding hospitalization or escalation of level of care  Decision for DNR or to de-escalate care   Parenteral controlled   substances            LEGEND - Independent interpretation involves the interpretation of a test for which there is a CPT code, and an interpretation or report is customary. When a review and interpretation of a test is performed and documented by the provider, but not separately reported (billed), then this would represent an independent interpretation. This report does not need to conform to the usual standards of a complete report of the test. This does not include interpretation of tests that do not have formal reports such as a complete blood count with differential and blood cultures. Examples would include reviewing a chest radiograph and documenting in the medical record an interpretation, but not separately reporting (billing) the interpretation of the chest radiograph.   An appropriate source includes professionals who are not health care professionals but may be involved in the management of the patient, such as a Clinical research associate, upper officer, case manager or teacher, and does not include discussion with family or informal caregivers.    - SDOH: SDOH are the conditions in the environments where people are born, live, learn, work, play, worship, and age that affect a wide range of health, functioning, and quality-of-life outcomes and risks. (e.g., housing, food insecurity, transportation, etc.). SDOH-related Z codes ranging from Z55-Z65 are the ICD-10-CM diagnosis codes used to document SDOH data Z55 - Problems related to education and literacy Z56 - Problems related to employment and unemployment Z57 - Occupational exposure to risk factors Z58 - Problems related to physical environment Z59 - Problems related to housing and economic circumstances [phone] - Problems related to social environment [phone] - Problems related to upbringing [phone] - Other problems related to primary support group, including family circumstances Z43 - Problems related to certain psychosocial circumstances Z65 -  Problems related to other psychosocial circumstances

## 2023-02-22 NOTE — Patient Instructions (Addendum)
ICD-10-CM   1. Sarcoidosis  D86.9     2. 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 Renal cell carcinoma of left kidney (HCC)  C64.2     4. Chronic kidney disease, unspecified CKD stage  N18.9     5. Paronychia of finger of left hand  L03.012      #Sarcoidosis - CT chest aug 2019 compared to aug 2017 through sept 2020 shows improved response in sarcoidosis but last CT scan was in 2020.  Since then he had total nephrectomy in 2024 for renal cell counts are normal.   Plan - Monitor off methotrexate and prednisone clinically -Get high-resolution CT chest without contrast  #Chronic kidney disease # History of anemia in June 2024  -Creatinine 3.2 mg percent 2 days after nephrectomy in June 2024  Plan - Check CBC and chemistry  #Paronychia of the left index finger  Plan - warm soaks 15 min x 4 times daily - neosporin ointment after each soak - If it does not go away in a few days please call your primary care doctor  Followup 2 weeks video with  or APP to discuss results

## 2023-02-23 ENCOUNTER — Telehealth: Payer: Self-pay | Admitting: Internal Medicine

## 2023-02-23 NOTE — Telephone Encounter (Signed)
Dear Mr Latonio Filosa   Both creatinine and hemoglobin are trending in the wrong direction. Pleaese address this with your renal and primary care doctor. We will not be calling you with these results  Thanks  Dr R      Latest Reference Range & Units 07/28/13 04:44 09/27/16 04:57 09/28/16 03:09 09/30/16 03:34 10/02/16 06:23 10/03/16 07:57 11/22/16 17:23 01/04/17 09:02 02/25/17 13:13 04/13/17 14:41 04/21/17 04:05 05/24/17 08:47 08/24/17 09:06 11/24/17 14:08 02/22/18 11:47 09/13/18 09:10 09/27/18 14:11 12/15/22 09:00 12/30/22 05:24 12/31/22 05:04 02/22/23 15:07  Creatinine 0.40 - 1.50 mg/dL 2.84 (H) 1.32 (H) 4.40 (H) 2.15 (H) 1.78 (H) 1.76 (H) 1.62 (H) 1.80 (H) 1.78 (H) 1.82 (H) 2.15 (H) 1.88 (H) 1.94 (H) 1.99 (H) 2.09 (H) 2.12 (H) 2.05 (H) 1.96 (H) 2.78 (H) 3.22 (H) 3.41 (H)  (H): Data is abnormally high   Latest Reference Range & Units 07/28/13 04:44 09/27/16 04:57 09/28/16 03:09 09/30/16 03:34 10/01/16 07:32 11/22/16 17:23 01/04/17 09:02 02/25/17 13:13 04/13/17 14:41 04/20/17 12:14 04/21/17 04:05 05/24/17 08:47 08/24/17 09:06 11/24/17 14:08 02/22/18 11:47 09/13/18 09:10 09/27/18 14:11 12/15/22 09:00 12/29/22 12:42 12/30/22 05:24 02/22/23 15:07  Hemoglobin 13.0 - 17.0 g/dL 10.2 (L) 9.7 (L) 9.0 (L) 8.6 (L) 8.9 (L) 10.8 (L) 12.7 (L) 12.1 (L) 11.3 (L) 11.3 (L) 10.0 (L) 9.9 (L) 11.6 (L) 11.4 (L) 11.8 (L) 12.2 (L) 12.3 (L) 13.2 13.0 12.1 (L) 11.1 (L)  (L): Data is abnormally low

## 2023-03-01 ENCOUNTER — Ambulatory Visit (HOSPITAL_BASED_OUTPATIENT_CLINIC_OR_DEPARTMENT_OTHER)
Admission: RE | Admit: 2023-03-01 | Discharge: 2023-03-01 | Disposition: A | Discharge status: Home / Self Care | Payer: BC Managed Care – PPO | Source: Ambulatory Visit | Attending: Internal Medicine | Admitting: Internal Medicine

## 2023-03-01 DIAGNOSIS — D869 Sarcoidosis, unspecified: Secondary | ICD-10-CM | POA: Insufficient documentation

## 2023-03-07 NOTE — Progress Notes (Signed)
Good news - no evidence of kidney cancer metastasizing to lung Xxxx  IMPRESSION: 1. Small to borderline enlarged mediastinal and upper abdominal lymph nodes, compatible with the provided history of sarcoid. No pulmonary parenchymal involvement. 2. Minimal air trapping, indicative of small airways disease.   Electronically Signed   By: Leanna Battles M.D.   On: 03/07/2023 08:19

## 2023-03-08 ENCOUNTER — Encounter: Payer: Self-pay | Admitting: Internal Medicine

## 2023-03-08 ENCOUNTER — Ambulatory Visit: Payer: BC Managed Care – PPO | Admitting: Internal Medicine

## 2023-03-08 VITALS — BP 120/90 | HR 61 | Ht 69.0 in | Wt 232.0 lb

## 2023-03-08 DIAGNOSIS — C642 Malignant neoplasm of left kidney, except renal pelvis: Secondary | ICD-10-CM | POA: Diagnosis not present

## 2023-03-08 DIAGNOSIS — D869 Sarcoidosis, unspecified: Secondary | ICD-10-CM | POA: Diagnosis not present

## 2023-03-08 DIAGNOSIS — N189 Chronic kidney disease, unspecified: Secondary | ICD-10-CM | POA: Diagnosis not present

## 2023-03-08 DIAGNOSIS — Z905 Acquired absence of kidney: Secondary | ICD-10-CM | POA: Diagnosis not present

## 2023-03-08 NOTE — Progress Notes (Signed)
IOV 11/22/2016  Chief Complaint  Patient presents with   Pulmonary Consult    Pt referred by Dr. Kevan Cruz for sarcoid. Pt c/o SOB with activity x 9 months. Pt states he was recently hospitalized for BIL pna. Pt c/o non prod cough, chest congstion. Pt denies f/c/s and CP/tightness.     57 year old male. He is a Data processing manager at [employer]. 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 He tells me that starting July August 2017 started noticing left-sided cervical adenopathy associated with some night sweats and mild shortness of breath and fatigue. Workup in August 2017 : 03/11/2016 had CT chest and abdomen pelvis: I personally visualized this film and it is significant for diffuse adenopathy in the mediastinal and abdominal areas. 03/12/2016 the following day he underwent cervical lymph node biopsy by Dr. Brynda Cruz and this showed granulomatous lymphadenitis.. Tthis led to the diagnosis of sarcoidosis. On the CT chest as pulmonary lung fields had just some scattered mild nodularity which is reflected on the chest x-ray from 10/19/2016 which I personally visualized that shows just mild interstitial prominence c/w aug 2017 films and improved from pneumonia admit march 2018. Then in September 2017 he was started on prednisone. By October November 2017 due to lack of resolution of the symptoms and the lymph node area he was referred to Dr.  Kellie Cruz rheumatologist   (since retired) and was started on methotrexate. Patient states that he is on 10 mg of methotrexate on Thursday once weekly and 10 mg of methotrexate on Friday once weekly totaling over 20 mg of methotrexate weekly. He is on folic acid. With this he did improve upon his symptoms with the fatigue and shortness of breath and the lymphadenopathy. However he has not had follow-up CT imaging of his abdomen. Then in March 2018 was diagnosed with rhinovirus respiratory infection pneumonia and was admitted. HIV test was negative at  that time. He did have acute kidney injury which seemed to be improving as of 10/03/2016 but no follow-up labs since then on the computer. He also seems to have chronic anemia. CXR shows resultion April 2018. Echocardiogram 10/01/2016: shows left ventricle ejection fraction 55% with grade 1 diastolic dysfunction  Currently he has improved in terms of his chest x-ray. He still has some post pneumonia fatigue but otherwise is feeling stable.  He particularly denies any clinical diagnosis of sarcoidosis in his brain or eyes or skin or renal system although he is known to have high elevated creatinine for a few years which has been presumed due to hypertension. He has never seen a nephrologist.    OV 08/26/2017  Chief Complaint  Patient presents with   Follow-up    sarcoidosis- feeling good slight congestion, getting over a cold    Follow-up sarcoidosis diagnosed in 2017 in the form of diffuse mediastinal and abdominal lymphadenopathy and cervical lymphadenopathy with some pulmonary infiltrates.  Started sometime in  fall 2017 methotrexate and prednisone  I personally saw him in May 2018.  At the time he was on 20 mg of methotrexate a week.  I reduce it to 10 mg/week.  I had him continue his daily prednisone.  Since that time he has had a partial left nephrectomy because of my referral to nephrologist because of increased creatinine.  This was done towards the end of 2018.  He is also seen Dr. Corliss Cruz rheumatologist.  She has him now tapering on prednisone.  He is essentially asymptomatic from a respiratory standpoint.  2 days ago he picked up a cold but he is recovering well from that with Alka-Seltzer.  There are no other new issues no shortness of breath wheezing or cough or chills or B symptoms.  Latest creatinine shows chronic kidney insufficiency but this being followed by Dr. Bufford Cruz   OV 12/22/2017  Chief Complaint  Patient presents with   Follow-up    Breathing is improved since  last OV. Denies issues with SOB, chest tightness, wheezing or coughing.    Follow-up sarcoidosis diagnosed in 2017 in the form of diffuse mediastinal and abdominal lymphadenopathy and cervical lymphadenopathy with some pulmonary infiltrates.  Started sometime in  fall 2017 methotrexate and prednisone. Off prednisone as of June 2019 and on methotrexate 7.5mg  per week in June 2019.  Status post left partial nephrectomy for renal cell carcinoma April 20, 2017  Since last visit with me earlier in the year he is doing well.  Denies any respiratory symptoms or shortness of breath cough wheezing orthopnea proximal nocturnal dyspnea hemoptysis edema fever chills.  In addition he also denies any B symptoms with suggest nocturnal sweats.  He visited Dr. D rheumatologist in March 2019 and has been tapered off prednisone.  He is on a lower dose of methotrexate 7.5 mg once a week along with daily folic acid.  Her primary function test today that shows significant improvement in lung function.  Last chest x-ray was in April 2018 that was essentially clear except for some upper lobe infiltrates.  At this point is wondering if he can make further inroads into tapering his methotrexate.  Recent lab work is all characterized below and the look normal     Results for Mike Cruz (MRN [id number]) as of 12/22/2017 14:53  Ref. Range 12/10/2016 16:07 12/22/2017 13:50  FVC-Pre Latest Units: L 3.68 4.17  FVC-%Pred-Pre Latest Units: % 94 108  Results for Mike Cruz (MRN [id number]) as of 12/22/2017 14:53  Ref. Range 12/10/2016 16:07 12/22/2017 13:50  DLCO cor Latest Units: ml/min/mmHg 20.47 27.74  DLCO cor % pred Latest Units: % 68 92   Results for Mike Cruz (MRN [id number]) as of 12/22/2017 14:53  Ref. Range 11/24/2017 14:08  Creatinine Latest Ref Range: 0.70 - 1.33 mg/dL 1.66 (H)  Results for Mike Cruz (MRN [id number]) as of 12/22/2017 14:53  Ref. Range 11/24/2017 14:08  Hemoglobin Latest Ref Range: 13.2 - 17.1 g/dL  93.2 (L)  Results for Mike Cruz (MRN [id number]) as of 12/22/2017 14:53  Ref. Range 11/24/2017 14:08  WBC Latest Ref Range: 3.8 - 10.8 Thousand/uL 6.5  Results for Mike Cruz (MRN [id number]) as of 12/22/2017 14:53  Ref. Range 11/24/2017 14:08  AST Latest Ref Range: 10 - 35 U/L 26  ALT Latest Ref Range: 9 - 46 U/L 23     OV 03/22/2018  Subjective:  Patient ID: Mike Cruz, male , DOB: Jul 24, 1965 , age MRN: [id number] , ADDRESS: [address] [place] [address]   03/22/2018 -   Chief Complaint  Patient presents with   Follow-up    Pt states he has been doing well since last visit and denies any complaints.    Follow-up sarcoidosis diagnosed in 2017 in the form of diffuse mediastinal and abdominal lymphadenopathy and cervical lymphadenopathy with some pulmonary infiltrates.  Started sometime in  fall 2017 methotrexate and prednisone. Off prednisone as of June 2019 and on methotrexate 7.5mg  per week in June 2019.  Status post left partial nephrectomy for renal cell carcinoma April 20, 2017 .  REduced methotrxate to 5mg  per day June 2019. Off Mtx since leate 2019/early 2020     Mike Cruz 57 y.o. -continues to do well. He is asymptomatic. In August 2019 he had workup for potential bone lesions according to his history and chart review. 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 During this time he had a CT chest which shows improved medicine lymphadenopathy very small levels and also multiple pulmonary nodules that are really small. One-year follow-up CT is recommended.He is currently on methotrexate 5 mg once a week along with folic acid.. I personally visualized the CT chest from August 2019     ROS   OV 03/27/2020  Subjective:  Patient ID: Mike Cruz, male , DOB: 10-29-1965 , age 50 y.o. , MRN: [id number] , ADDRESS: [address] [place] [address]   03/27/2020 -   Chief Complaint  Patient presents with   Follow-up    here to go over pft results        HPI Mike Cruz 57 y.o. -presents for follow-up.  Last seen in September 2019.  After that not seen in September 2020 because of the pandemic.  However in September 2020 he did have a CT scan of the chest that showed continued response and nodules and mediastinal adenopathy.  Today he tells me has been off methotrexate since late 2019/early 2020.  He last saw Dr. Algis Downs in March 2021 and was noted that he was off methotrexate for over a year.  She also supported clinical follow-up.  He is feeling fine without any respiratory symptoms.  No B symptoms.  No abdominal symptoms.  He has had colonoscopy.  He has had his Covid vaccine.  However he is not had his flu shot.  He wants to have high-dose flu shot which we do not have in stock.  He is not had shingles vaccine but he said he will talk to his primary care physician.  He is interested in the booster vaccine for Covid as and when it is recommended for his age group.  Currently he is not immunosuppressed and therefore he does not qualify although if he wanted he can get it by directly communicating with the pharmacy.  He is aware of this.    OV 02/22/2023  Subjective:  Patient ID: Mike Cruz, male , DOB: 10-15-1965 , age 81 y.o. , MRN: [id number] , ADDRESS: [address] [place] [address] PCP Collene Mares, [place] Patient Care Team: Collene Mares, [place] as PCP - General (Internal Medicine) Pollyann Savoy, MD as Consulting Physician (Rheumatology)  This Provider for this visit: Treatment Team:  Attending Provider: Kalman Shan, MD   02/22/2023 -   Chief Complaint  Patient presents with   Follow-up    F/up on PFT, no complaints     HPI Mike Cruz 57 y.o. -returns for follow-up.  At present have not seen him since 2021.  Next month will be 3 years since I saw him but this is less than 3 years in the fourth established follow-up visit.  He tells me in June 2024 he had a local recurrence and Dr. Dwana Curd  did a full nephrectomy.  There were no local tumor recurrence.  He believes he is in complete remission but he did indicate the normal CT scan of the chest was done.  He did have a CT abdomen that I personally visualized.  This was done in February 2024.  The lower lung fields are reported  as clear.  Subsequently had an MRI of the abdomen.  However since the summa in the spring 2024 is not had any chest imaging.  Even though he is asymptomatic from a respiratory standpoint he does feel the need to get a CT scan of the chest because of prior sarcoid and also the potential for renal cell carcinoma lung metastasis.  Of note he tells me that for the last 3 weeks he has noticed a skin lesion in his left index finger.  Looks like a boil.  Probably consistent with paronychia.  There is no fever or purulence.   Also in June 2020 for after the surgery his creatinine was 3.2 mg percent.  He was also little bit anemic.  He says he did not do labs with primary care physician but does not know the details.  He is open to getting it retested here today.    OV 03/08/2023  Subjective:  Patient ID: Mike Cruz, male , DOB: 1966/02/02 , age 52 y.o. , MRN: [id number] , ADDRESS: [address] [place] [address] PCP Collene Mares, [place] Patient Care Team: Collene Mares, [place] as PCP - General (Internal Medicine) Pollyann Savoy, MD as Consulting Physician (Rheumatology)  This Provider for this visit: Treatment Team:  Attending Provider: Kalman Shan, MD    03/08/2023 -   Chief Complaint  Patient presents with   Follow-up    2 weeks f/up     Follow-up sarcoidosis diagnosed in 2017 in the form of diffuse mediastinal and abdominal lymphadenopathy and cervical lymphadenopathy with some pulmonary infiltrates.   0 Started sometime in  fall 2017 methotrexate and prednisone. - Off prednisone as of June 2019 and on methotrexate 7.5mg  per week in June 2019.  ->  . REduced methotrxate to  5mg  per day June 2019.  Off methotrexate late 2019/early 2020  Status post left partial nephrectomy for renal cell carcinoma April 20, 2017; complete nephrectomy June 2024 for recurrence   HPI Mike Cruz 57 y.o. -this follow-up visit to discuss test results.  He had paronychia of the left index finger last time I told him to use warm compress but it is not better.  He said he will talk to his primary care physician.  Also he had anemia and worsening creatinine after his nephrectomy in June 2024.  I rechecked this and these are worse.  He is got upcoming appointment with renal he will discuss with them.  Meanwhile he did have CT scan of the chest high-resolution to evaluate sarcoid adenopathy and also to rule out any renal mets to the lung.  He had high-resolution CT chest 03/01/2023.  He has mild mediastinal adenopathy this is stable since 2020.  There is no metastatic involvement in the lung.  Visualized and showed the stones.  He felt reassured.  There is no shortness of breath or cough or wheezing at this point.  PFT  Latest Reference Range & Units 07/28/13 04:44 09/27/16 04:57 09/28/16 03:09 09/30/16 03:34 10/01/16 07:32 11/22/16 17:23 01/04/17 09:02 02/25/17 13:13 04/13/17 14:41 04/20/17 12:14 04/21/17 04:05 05/24/17 08:47 08/24/17 09:06 11/24/17 14:08 02/22/18 11:47 09/13/18 09:10 09/27/18 14:11 12/15/22 09:00 12/29/22 12:42 12/30/22 05:24 02/22/23 15:07  Hemoglobin 13.0 - 17.0 g/dL 78.2 (L) 9.7 (L) 9.0 (L) 8.6 (L) 8.9 (L) 10.8 (L) 12.7 (L) 12.1 (L) 11.3 (L) 11.3 (L) 10.0 (L) 9.9 (L) 11.6 (L) 11.4 (L) 11.8 (L) 12.2 (L) 12.3 (L) 13.2 13.0 12.1 (L) 11.1 (L)  (L): Data is abnormally  low   Latest Reference Range & Units 07/28/13 04:44 09/27/16 04:57 09/28/16 03:09 09/30/16 03:34 10/02/16 06:23 10/03/16 07:57 11/22/16 17:23 01/04/17 09:02 02/25/17 13:13 04/13/17 14:41 04/21/17 04:05 05/24/17 08:47 08/24/17 09:06 11/24/17 14:08 02/22/18 11:47 09/13/18 09:10 09/27/18 14:11 12/15/22 09:00 12/30/22  05:24 12/31/22 05:04 02/22/23 15:07  Creatinine 0.40 - 1.50 mg/dL 2.95 (H) 6.21 (H) 3.08 (H) 2.15 (H) 1.78 (H) 1.76 (H) 1.62 (H) 1.80 (H) 1.78 (H) 1.82 (H) 2.15 (H) 1.88 (H) 1.94 (H) 1.99 (H) 2.09 (H) 2.12 (H) 2.05 (H) 1.96 (H) 2.78 (H) 3.22 (H) 3.41 (H)  (H): Data is abnormally high   has a past medical history of Acute renal failure (ARF) (HCC), Acute respiratory failure (HCC), Anemia, Cancer (HCC), Cancer of kidney, left (HCC) (01/2021), Family history of colon cancer, Family history of kidney cancer, Family history of ovarian cancer, Family history of prostate cancer, Family history of stomach cancer, History of kidney stones, Hypertension, Hyponatremia, Kidney stone, Pneumonia, and Sarcoidosis.   reports that he has never smoked. He has been exposed to tobacco smoke. He has never used smokeless tobacco.  Past Surgical History:  Procedure Laterality Date   EXTRACORPOREAL SHOCK WAVE LITHOTRIPSY Left 05/03/2022   Procedure: LEFT EXTRACORPOREAL SHOCK WAVE LITHOTRIPSY (ESWL);  Surgeon: Crist Fat, MD;  Location: [facility];  Service: Urology;  Laterality: Left;   HEAD & NECK SKIN LESION EXCISIONAL BIOPSY     KNEE SURGERY Left    Arthroscopic   partial nephrectomt     Left 04/20/17 Dr. Berneice Heinrich   ROBOTIC ASSITED PARTIAL NEPHRECTOMY Left 04/20/2017   Procedure: XI ROBOTIC ASSITED PARTIAL NEPHRECTOMY;  Surgeon: Sebastian Ache, MD;  Location: [facility];  Service: Urology;  Laterality: Left;   ROBOTIC ASSITED PARTIAL NEPHRECTOMY Left 12/29/2022   Procedure: XI ROBOTIC ASSITED RADICAL NEPHRECTOMY WITH INTRAOPERATIVE ULTRASOUND;  Surgeon: Loletta Parish., MD;  Location: [facility];  Service: Urology;  Laterality: Left;  3HRS    Allergies  Allergen Reactions   Penicillins Hives    Denies airway involvement    Immunization History  Administered Date(s) Administered   Influenza Split 04/21/2017, 07/02/2022   Influenza,inj,Quad PF,6+ Mos 03/30/2016, 04/18/2016, 04/21/2017,  03/22/2018, 07/10/2021   Influenza-Unspecified 04/04/2019   Moderna Covid-19 Vaccine Bivalent Booster 11yrs & up 07/10/2021   Moderna SARS-COV2 Booster Vaccination 05/15/2020, 01/07/2021   Moderna Sars-Covid-2 Vaccination 08/30/2019, 10/02/2019, 04/25/2020, 07/02/2022   Tdap 03/01/2019    Family History  Problem Relation Age of Onset   Ovarian cancer Mother 60   AAA (abdominal aortic aneurysm) Father    Prostate cancer Father 54   Kidney cancer Father 55       was 2 separate primaries, not a met   Stroke Sister 25   Healthy Daughter    Healthy Daughter    Healthy Son    Lung cancer Maternal Aunt    Stomach cancer Maternal Grandmother 9   Colon cancer Maternal Grandmother 75   Prostate cancer Maternal Grandfather 65       metastatic   Lymphoma Cousin    Prostate cancer Cousin 50     Current Outpatient Medications:    FARXIGA 10 MG TABS tablet, Take 10 mg by mouth daily., Disp: , Rfl:    Olmesartan-Amlodipine-HCTZ 40-5-12.5 MG TABS, Take 1 tablet by mouth every morning., Disp: , Rfl: 3      Objective:   Vitals:   03/08/23 1607  BP: (!) 120/90  Pulse: 61  SpO2: 100%  Weight: 232 lb (105.2 kg)  Height:  5\' 9"  (1.753 m)    Estimated body mass index is 34.26 kg/m as calculated from the following:   Height as of this encounter: 5\' 9"  (1.753 m).   Weight as of this encounter: 232 lb (105.2 kg).  @WEIGHTCHANGE @  [employer]   03/08/23 1607  Weight: 232 lb (105.2 kg)     Physical Exam   General: No distress. Looks well O2 at rest: no Cane present: no Sitting in wheel chair: no Frail: no Obese: no Neuro: Alert and Oriented x 3. GCS 15. Speech normal Psych: Pleasant Resp:  Barrel Chest - no.  Wheeze - no, Crackles - no, No overt respiratory distress CVS: Normal heart sounds. Murmurs - no Ext: Stigmata of Connective Tissue Disease - no HEENT: Normal upper airway. PEERL +. No post nasal drip        Assessment:       ICD-10-CM   1. Sarcoidosis   D86.9     2. 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 Renal cell carcinoma of left kidney (HCC)  C64.2     4. Chronic kidney disease, unspecified CKD stage  N18.9          Plan:     Patient Instructions     ICD-10-CM   1. Sarcoidosis  D86.9     2. 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 Renal cell carcinoma of left kidney (HCC)  C64.2     4. Chronic kidney disease, unspecified CKD stage  N18.9     5. Paronychia of finger of left hand  L03.012      #Sarcoidosis - CT chest aug 2019 compared to aug 2017 through sept 2020 shows improved response in sarcoidosis but last CT scan was in 2020.  Since then he had total nephrectomy in 2024 for renal cell counts are normal. CT chest August 2024 wti mild mediastinal adenopathy without chgne since 2020 and c/w Sarcoid  - No evidence of renal cancer in lung   Plan - Monitor off methotrexate and prednisone clinically - #Chronic kidney disease # History of anemia in June 2024  -kidney function and anemia slightly worse since June 2024  Plan - per renal doc   Followup  9-12 months or soooner if neded   FOLLOWUP No follow-ups on file.    SIGNATURE    Dr. Kalman Shan, M.D., F.C.C.P,  Pulmonary and Critical Care Medicine Staff Physician, [facility] Center Director - Interstitial Lung Disease  Program  Pulmonary Fibrosis [facility] [place], [place], [address]  Pager: [phone], If no answer or between  15:00h - 7:00h: call [phone] Telephone: [phone]  5:04 PM 03/08/2023   Moderate Complexity MDM OFFICE  2021 E/M guidelines, first released in 2021, with minor revisions added in 2023 and 2024 Must meet the requirements for 2 out of 3 dimensions to qualify.    Number and complexity of problems addressed Amount and/or complexity of data reviewed Risk of complications and/or morbidity  One or more chronic illness with mild exacerbation, OR  progression, OR  side effects of treatment  Two or more stable chronic illnesses  One undiagnosed new problem with uncertain prognosis  One acute illness with systemic symptoms   One Acute complicated injury Must meet the requirements for 1 of 3 of the categories)  Category 1: Tests and documents, historian  Any combination of 3 of the following:  Assessment requiring an independent historian  Review of prior external note(s) from each unique source  Review of results of each unique test  Ordering of each unique test    Category 2: Interpretation of tests   Independent interpretation of a test performed by another physician/other qualified health care professional (not separately reported)  Category 3: Discuss management/tests  Discussion of management or test interpretation with external physician/other qualified health care professional/appropriate source (not separately reported) Moderate risk of morbidity from additional diagnostic testing or treatment Examples only:  Prescription drug management  Decision regarding minor surgery with identfied patient or procedure risk factors  Decision regarding elective major surgery without identified patient or procedure risk factors  Diagnosis or treatment significantly limited by social determinants of health             HIGh Complexity  OFFICE   2021 E/M guidelines, first released in 2021, with minor revisions added in 2023. Must meet the requirements for 2 out of 3 dimensions to qualify.    Number and complexity of problems addressed Amount and/or complexity of data reviewed Risk of complications and/or morbidity  Severe exacerbation of chronic illness  Acute or chronic illnesses that may pose a threat to life or bodily function, e.g., multiple trauma, acute MI, pulmonary embolus, severe respiratory distress, progressive rheumatoid arthritis, psychiatric illness with potential threat to self or others,  peritonitis, acute renal failure, abrupt change in neurological status Must meet the requirements for 2 of 3 of the categories)  Category 1: Tests and documents, historian  Any combination of 3 of the following:  Assessment requiring an independent historian  Review of prior external note(s) from each unique source  Review of results of each unique test  Ordering of each unique test    Category 2: Interpretation of tests    Independent interpretation of a test performed by another physician/other qualified health care professional (not separately reported)  Category 3: Discuss management/tests  Discussion of management or test interpretation with external physician/other qualified health care professional/appropriate source (not separately reported)  HIGH risk of morbidity from additional diagnostic testing or treatment Examples only:  Drug therapy requiring intensive monitoring for toxicity  Decision for elective major surgery with identified pateint or procedure risk factors  Decision regarding hospitalization or escalation of level of care  Decision for DNR or to de-escalate care   Parenteral controlled  substances            LEGEND - Independent interpretation involves the interpretation of a test for which there is a CPT code, and an interpretation or report is customary. When a review and interpretation of a test is performed and documented by the provider, but not separately reported (billed), then this would represent an independent interpretation. This report does not need to conform to the usual standards of a complete report of the test. This does not include interpretation of tests that do not have formal reports such as a complete blood count with differential and blood cultures. Examples would include reviewing a chest radiograph and documenting in the medical record an interpretation, but not separately reporting (billing) the interpretation of the  chest radiograph.   An appropriate source includes professionals who are not health care professionals but may be involved in the management of the patient, such as a Clinical research associate, upper officer, case manager or teacher, and does not include discussion with family or informal caregivers.    - SDOH: SDOH are the conditions in the environments where people are born,  live, learn, work, play, worship, and age that affect a wide range of health, functioning, and quality-of-life outcomes and risks. (e.g., housing, food insecurity, transportation, etc.). SDOH-related Z codes ranging from Z55-Z65 are the ICD-10-CM diagnosis codes used to document SDOH data Z55 - Problems related to education and literacy Z56 - Problems related to employment and unemployment Z57 - Occupational exposure to risk factors Z58 - Problems related to physical environment Z59 - Problems related to housing and economic circumstances [phone] - Problems related to social environment [phone] - Problems related to upbringing [phone] - Other problems related to primary support group, including family circumstances Z55 - Problems related to certain psychosocial circumstances Z65 - Problems related to other psychosocial circumstances

## 2023-03-08 NOTE — Patient Instructions (Addendum)
ICD-10-CM   1. Sarcoidosis  D86.9     2. 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 Renal cell carcinoma of left kidney (HCC)  C64.2     4. Chronic kidney disease, unspecified CKD stage  N18.9     5. Paronychia of finger of left hand  L03.012      #Sarcoidosis - CT chest aug 2019 compared to aug 2017 through sept 2020 shows improved response in sarcoidosis but last CT scan was in 2020.  Since then he had total nephrectomy in 2024 for renal cell counts are normal. CT chest August 2024 wti mild mediastinal adenopathy without chgne since 2020 and c/w Sarcoid  - No evidence of renal cancer in lung   Plan - Monitor off methotrexate and prednisone clinically - #Chronic kidney disease # History of anemia in June 2024  -kidney function and anemia slightly worse since June 2024  Plan - per renal doc   Followup  9-12 months or [place] if neded

## 2023-09-19 ENCOUNTER — Other Ambulatory Visit: Payer: Self-pay | Admitting: Gastroenterology

## 2023-09-19 DIAGNOSIS — K862 Cyst of pancreas: Secondary | ICD-10-CM

## 2023-10-15 ENCOUNTER — Other Ambulatory Visit: Payer: Self-pay | Admitting: Gastroenterology

## 2023-10-15 ENCOUNTER — Ambulatory Visit (HOSPITAL_COMMUNITY)
Admission: RE | Admit: 2023-10-15 | Discharge: 2023-10-15 | Disposition: A | Discharge status: Home / Self Care | Source: Ambulatory Visit | Attending: Gastroenterology | Admitting: Gastroenterology

## 2023-10-15 DIAGNOSIS — K862 Cyst of pancreas: Secondary | ICD-10-CM | POA: Insufficient documentation

## 2023-10-15 MED ORDER — GADOBUTROL 1 MMOL/ML IV SOLN
10.0000 mL | Freq: Once | INTRAVENOUS | Status: AC | PRN
  Administered 2023-10-15: 10 mL via INTRAVENOUS

## 2023-10-29 ENCOUNTER — Other Ambulatory Visit: Payer: Self-pay

## 2023-12-21 ENCOUNTER — Ambulatory Visit: Payer: BC Managed Care – PPO | Admitting: Rheumatology

## 2024-01-10 NOTE — Progress Notes (Deleted)
 Office Visit Note  Patient: Mike Cruz             Date of Birth: 1965/09/13           MRN: [id number]             PCP: Cleotilde, [place]  E, PA Referring: Cleotilde, [place]  E, PA Visit Date: 01/24/2024 Occupation: @GUAROCC @  Subjective:  No chief complaint on file.   History of Present Illness: Mike Cruz is a 58 y.o. male ***     Activities of Daily Living:  Patient reports morning stiffness for *** {minute/hour:19697}.   Patient {ACTIONS;DENIES/REPORTS:21021675::Denies} nocturnal pain.  Difficulty dressing/grooming: {ACTIONS;DENIES/REPORTS:21021675::Denies} Difficulty climbing stairs: {ACTIONS;DENIES/REPORTS:21021675::Denies} Difficulty getting out of chair: {ACTIONS;DENIES/REPORTS:21021675::Denies} Difficulty using hands for taps, buttons, cutlery, and/or writing: {ACTIONS;DENIES/REPORTS:21021675::Denies}  No Rheumatology ROS completed.   PMFS History:  Patient Active Problem List   Diagnosis Date Noted   Genetic testing 12/07/2017   Family history of kidney cancer    Family history of ovarian cancer    Family history of prostate cancer    Family history of stomach cancer    Family history of colon cancer    Renal cell carcinoma (HCC) 11/03/2017   Renal mass 04/20/2017   History of hypertension 12/31/2016   History of anemia 12/31/2016   High risk medication use 12/31/2016   History of renal calculi 12/31/2016   History of prostatitis 12/31/2016   On prednisone  therapy 12/31/2016   Abnormal serum angiotensin-converting enzyme level 12/31/2016   Acute kidney injury superimposed on chronic kidney disease (HCC) 11/22/2016   History of viral pneumonia 11/22/2016   Anemia 09/27/2016   Sarcoidosis    Hypertension     Past Medical History:  Diagnosis Date   Acute renal failure (ARF) (HCC)    Acute respiratory failure (HCC)    Anemia    Cancer (HCC)    Cancer of kidney, left (HCC) 01/2021   Family history of colon cancer    Family history of  kidney cancer    Family history of ovarian cancer    Family history of prostate cancer    Family history of stomach cancer    History of kidney stones    Hypertension    Hyponatremia    Kidney stone    kidney mass 04/13/17   Pneumonia    Sarcoidosis     Family History  Problem Relation Age of Onset   Ovarian cancer Mother 74   AAA (abdominal aortic aneurysm) Father    Prostate cancer Father 80   Kidney cancer Father 69       was 2 separate primaries, not a met   Stroke Sister 9   Healthy Daughter    Healthy Daughter    Healthy Son    Lung cancer Maternal Aunt    Stomach cancer Maternal Grandmother 47   Colon cancer Maternal Grandmother 75   Prostate cancer Maternal Grandfather 11       metastatic   Lymphoma Cousin    Prostate cancer Cousin 40   Past Surgical History:  Procedure Laterality Date   EXTRACORPOREAL SHOCK WAVE LITHOTRIPSY Left 05/03/2022   Procedure: LEFT EXTRACORPOREAL SHOCK WAVE LITHOTRIPSY (ESWL);  Surgeon: Cam Morene ORN, MD;  Location: [facility];  Service: Urology;  Laterality: Left;   HEAD & NECK SKIN LESION EXCISIONAL BIOPSY     KNEE SURGERY Left    Arthroscopic   partial nephrectomt     Left 04/20/17 Dr. Alvaro   ROBOTIC ASSITED PARTIAL NEPHRECTOMY Left 04/20/2017  Procedure: XI ROBOTIC ASSITED PARTIAL NEPHRECTOMY;  Surgeon: Alvaro Hummer, MD;  Location: [facility];  Service: Urology;  Laterality: Left;   ROBOTIC ASSITED PARTIAL NEPHRECTOMY Left 12/29/2022   Procedure: XI ROBOTIC ASSITED RADICAL NEPHRECTOMY WITH INTRAOPERATIVE ULTRASOUND;  Surgeon: Alvaro Hummer KATHEE Mickey., MD;  Location: [facility];  Service: Urology;  Laterality: Left;  3HRS   Social History   Social History Narrative   Not on file   Immunization History  Administered Date(s) Administered   Fluzone Influenza virus vaccine,trivalent (IIV3), split virus 04/21/2017, 07/02/2022   Influenza,inj,Quad PF,6+ Mos 03/30/2016, 04/18/2016, 04/21/2017, 03/22/2018, 07/10/2021    Influenza-Unspecified 04/04/2019   Moderna Covid-19 Vaccine Bivalent Booster 2yrs & up 07/10/2021   Moderna SARS-COV2 Booster Vaccination 05/15/2020, 01/07/2021   Moderna Sars-Covid-2 Vaccination 08/30/2019, 10/02/2019, 04/25/2020, 07/02/2022   Tdap 03/01/2019     Objective: Vital Signs: There were no vitals taken for this visit.   Physical Exam   Musculoskeletal Exam: ***  CDAI Exam: CDAI Score: -- Patient Global: --; Provider Global: -- Swollen: --; Tender: -- Joint Exam 01/24/2024   No joint exam has been documented for this visit   There is currently no information documented on the homunculus. Go to the Rheumatology activity and complete the homunculus joint exam.  Investigation: No additional findings.  Imaging: No results found.  Recent Labs: Lab Results  Component Value Date   WBC 6.8 02/22/2023   HGB 11.1 (L) 02/22/2023   PLT 180.0 02/22/2023   NA 138 02/22/2023   K 5.0 02/22/2023   CL 106 02/22/2023   CO2 22 02/22/2023   GLUCOSE 87 02/22/2023   BUN 30 (H) 02/22/2023   CREATININE 3.41 (H) 02/22/2023   BILITOT 0.4 09/27/2018   ALKPHOS 64 02/25/2017   AST 36 (H) 09/27/2018   ALT 36 09/27/2018   PROT 7.6 09/27/2018   ALBUMIN 4.3 02/25/2017   CALCIUM 10.3 02/22/2023   GFRAA 41 (L) 09/27/2018    Speciality Comments: No specialty comments available.  Procedures:  No procedures performed Allergies: Penicillins   Assessment / Plan:     Visit Diagnoses: Sarcoidosis - Longstanding history sarcoidosis-dx in 2017, CT 03/11/16: diffuse adenopathy in mediastinal and abdominal areas, LN biopsy 03/12/16-granulomatous lymphadenitis.  High risk medication use - d/c MTX due to elevated creatinine and AST in March 2020.  Bilateral bunions  Renal cell carcinoma of left kidney (HCC) - partial nephrectomy in 2018  History of renal calculi - last lithotripsy was in November 2023.  He had no recurrence of renal calculi since then.  Chronic kidney disease (CKD)  stage G3a - [facility]  History of hypertension  History of anemia  History of prostatitis  Orders: No orders of the defined types were placed in this encounter.  No orders of the defined types were placed in this encounter.   Face-to-face time spent with patient was *** minutes. Greater than 50% of time was spent in counseling and coordination of care.  Follow-Up Instructions: No follow-ups on file.   Waddell CHRISTELLA Craze, PA-C  Note - This record has been created using Dragon software.  Chart creation errors have been sought, but may not always  have been located. Such creation errors do not reflect on  the standard of medical care.

## 2024-01-24 ENCOUNTER — Ambulatory Visit: Payer: Self-pay | Admitting: Rheumatology

## 2024-01-24 DIAGNOSIS — Z87438 Personal history of other diseases of male genital organs: Secondary | ICD-10-CM

## 2024-01-24 DIAGNOSIS — Z87442 Personal history of urinary calculi: Secondary | ICD-10-CM

## 2024-01-24 DIAGNOSIS — D869 Sarcoidosis, unspecified: Secondary | ICD-10-CM

## 2024-01-24 DIAGNOSIS — Z862 Personal history of diseases of the blood and blood-forming organs and certain disorders involving the immune mechanism: Secondary | ICD-10-CM

## 2024-01-24 DIAGNOSIS — M21612 Bunion of left foot: Secondary | ICD-10-CM

## 2024-01-24 DIAGNOSIS — N1831 Chronic kidney disease, stage 3a: Secondary | ICD-10-CM

## 2024-01-24 DIAGNOSIS — C642 Malignant neoplasm of left kidney, except renal pelvis: Secondary | ICD-10-CM

## 2024-01-24 DIAGNOSIS — Z79899 Other long term (current) drug therapy: Secondary | ICD-10-CM

## 2024-01-24 DIAGNOSIS — Z8679 Personal history of other diseases of the circulatory system: Secondary | ICD-10-CM

## 2024-04-19 NOTE — Progress Notes (Signed)
 Office Visit Note  Patient: Mike Cruz             Date of Birth: 03-Sep-1965           MRN: [id number]             PCP: Cleotilde Gaynell BRAVO, PA Referring: Cleotilde, [place]  E, PA Visit Date: 05/02/2024 Occupation: Data Unavailable  Subjective:  No chief complaint on file.   History of Present Illness: Mike Cruz is a 58 y.o. male with sarcoidosis.  He returns today after his last visit in June 2024. He was evaluated by Dr. Geronimo on March 08, 2023.  Patient had a CT scan of the chest in August 2024 with mild mediastinal adenopathy without change from 2020 consistent with sarcoidosis.  No immunosuppressive therapy was advised by Dr. Geronimo.  He underwent left nephrectomy in June 2024 for recurrence of renal cancer.  He denies any shortness of breath.  There is no history of rash or joint pain or joint swelling.   Activities of Daily Living:  Patient reports morning stiffness for 0 minute.   Patient Denies nocturnal pain.  Difficulty dressing/grooming: Denies Difficulty climbing stairs: Denies Difficulty getting out of chair: Denies Difficulty using hands for taps, buttons, cutlery, and/or writing: Denies  Review of Systems  Constitutional:  Negative for fatigue.  HENT:  Negative for mouth sores and mouth dryness.   Eyes:  Negative for dryness.  Respiratory:  Negative for shortness of breath.   Cardiovascular:  Negative for chest pain and palpitations.  Gastrointestinal:  Negative for blood in stool, constipation and diarrhea.  Endocrine: Negative for increased urination.  Genitourinary:  Negative for involuntary urination.  Musculoskeletal:  Negative for joint pain, gait problem, joint pain, joint swelling, myalgias, muscle weakness, morning stiffness, muscle tenderness and myalgias.  Skin:  Negative for color change, rash, hair loss and sensitivity to sunlight.  Allergic/Immunologic: Negative for susceptible to infections.  Neurological:  Negative for dizziness and  headaches.  Hematological:  Negative for swollen glands.  Psychiatric/Behavioral:  Negative for depressed mood and sleep disturbance. The patient is not nervous/anxious.     PMFS History:  Patient Active Problem List   Diagnosis Date Noted   Genetic testing 12/07/2017   Family history of kidney cancer    Family history of ovarian cancer    Family history of prostate cancer    Family history of stomach cancer    Family history of colon cancer    Renal cell carcinoma (HCC) 11/03/2017   Renal mass 04/20/2017   History of hypertension 12/31/2016   History of anemia 12/31/2016   High risk medication use 12/31/2016   History of renal calculi 12/31/2016   History of prostatitis 12/31/2016   On prednisone  therapy 12/31/2016   Abnormal serum angiotensin-converting enzyme level 12/31/2016   Acute kidney injury superimposed on chronic kidney disease 11/22/2016   History of viral pneumonia 11/22/2016   Anemia 09/27/2016   Sarcoidosis    Hypertension     Past Medical History:  Diagnosis Date   Acute renal failure (ARF)    Acute respiratory failure (HCC)    Anemia    Cancer (HCC)    Cancer of kidney, left (HCC) 01/2021   Family history of colon cancer    Family history of kidney cancer    Family history of ovarian cancer    Family history of prostate cancer    Family history of stomach cancer    History of kidney stones    Hypertension  Hyponatremia    Kidney stone    kidney mass 04/13/17   Pneumonia    Sarcoidosis     Family History  Problem Relation Age of Onset   Ovarian cancer Mother 27   AAA (abdominal aortic aneurysm) Father    Prostate cancer Father 87   Kidney cancer Father 38       was 2 separate primaries, not a met   Stroke Sister 46   Healthy Daughter    Healthy Daughter    Healthy Son    Lung cancer Maternal Aunt    Stomach cancer Maternal Grandmother 40   Colon cancer Maternal Grandmother 3   Prostate cancer Maternal Grandfather 40       metastatic    Lymphoma Cousin    Prostate cancer Cousin 2   Past Surgical History:  Procedure Laterality Date   EXTRACORPOREAL SHOCK WAVE LITHOTRIPSY Left 05/03/2022   Procedure: LEFT EXTRACORPOREAL SHOCK WAVE LITHOTRIPSY (ESWL);  Surgeon: Cam Morene ORN, MD;  Location: [facility];  Service: Urology;  Laterality: Left;   HEAD & NECK SKIN LESION EXCISIONAL BIOPSY     KNEE SURGERY Left    Arthroscopic   partial nephrectomt     Left 04/20/17 Dr. Alvaro   ROBOTIC ASSITED PARTIAL NEPHRECTOMY Left 04/20/2017   Procedure: XI ROBOTIC ASSITED PARTIAL NEPHRECTOMY;  Surgeon: Alvaro Hummer, MD;  Location: [facility];  Service: Urology;  Laterality: Left;   ROBOTIC ASSITED PARTIAL NEPHRECTOMY Left 12/29/2022   Procedure: XI ROBOTIC ASSITED RADICAL NEPHRECTOMY WITH INTRAOPERATIVE ULTRASOUND;  Surgeon: Alvaro Hummer KATHEE Mickey., MD;  Location: [facility];  Service: Urology;  Laterality: Left;  3HRS   Social History   Tobacco Use   Smoking status: Never    Passive exposure: Past   Smokeless tobacco: Never  Vaping Use   Vaping status: Never Used  Substance Use Topics   Alcohol use: No   Drug use: Never   Social History   Social History Narrative   Not on file     Immunization History  Administered Date(s) Administered   Fluzone Influenza virus vaccine,trivalent (IIV3), split virus 04/21/2017, 07/02/2022   Influenza,inj,Quad PF,6+ Mos 03/30/2016, 04/18/2016, 04/21/2017, 03/22/2018, 07/10/2021   Influenza-Unspecified 04/04/2019   Moderna Covid-19 Vaccine Bivalent Booster 42yrs & up 07/10/2021   Moderna SARS-COV2 Booster Vaccination 05/15/2020, 01/07/2021   Moderna Sars-Covid-2 Vaccination 08/30/2019, 10/02/2019, 04/25/2020, 07/02/2022   Tdap 03/01/2019     Objective: Vital Signs: BP 123/81   Pulse 61   Temp 97.9 F (36.6 C)   Resp 14   Ht 5' 9 (1.753 m)   Wt 225 lb 12.8 oz (102.4 kg)   BMI 33.34 kg/m    Physical Exam Vitals and nursing note reviewed.  Constitutional:       Appearance: He is well-developed.  HENT:     Head: Normocephalic and atraumatic.  Eyes:     Conjunctiva/sclera: Conjunctivae normal.     Pupils: Pupils are equal, round, and reactive to light.  Cardiovascular:     Rate and Rhythm: Normal rate and regular rhythm.     Heart sounds: Normal heart sounds.  Pulmonary:     Effort: Pulmonary effort is normal.     Breath sounds: Normal breath sounds.  Abdominal:     General: Bowel sounds are normal.     Palpations: Abdomen is soft.  Musculoskeletal:     Cervical back: Normal range of motion and neck supple.  Skin:    General: Skin is warm and dry.  Capillary Refill: Capillary refill takes less than 2 seconds.  Neurological:     Mental Status: He is alert and oriented to person, place, and time.  Psychiatric:        Behavior: Behavior normal.      Musculoskeletal Exam: Cervical, thoracic and lumbar spine were in good range of motion.  There was no SI joint tenderness.  Shoulder joints, elbow joints, wrist joints, MCPs, PIPs and DIPs were in good range of motion with no synovitis.  Hip joints and knee joints were in good range of motion without any warmth swelling or effusion.  There was no tenderness over ankles or MTPs.   CDAI Exam: CDAI Score: -- Patient Global: --; Provider Global: -- Swollen: --; Tender: -- Joint Exam 05/02/2024   No joint exam has been documented for this visit   There is currently no information documented on the homunculus. Go to the Rheumatology activity and complete the homunculus joint exam.  Investigation: No additional findings.  Imaging: No results found.  Recent Labs: Lab Results  Component Value Date   WBC 6.8 02/22/2023   HGB 11.1 (L) 02/22/2023   PLT 180.0 02/22/2023   NA 138 02/22/2023   K 5.0 02/22/2023   CL 106 02/22/2023   CO2 22 02/22/2023   GLUCOSE 87 02/22/2023   BUN 30 (H) 02/22/2023   CREATININE 3.41 (H) 02/22/2023   BILITOT 0.4 09/27/2018   ALKPHOS 64 02/25/2017   AST  36 (H) 09/27/2018   ALT 36 09/27/2018   PROT 7.6 09/27/2018   ALBUMIN 4.3 02/25/2017   CALCIUM 10.3 02/22/2023   GFRAA 41 (L) 09/27/2018     Speciality Comments: No specialty comments available.  Procedures:  No procedures performed Allergies: Penicillins   Assessment / Plan:     Visit Diagnoses: Sarcoidosis - Longstanding history of sarcoidosis-dx in 2017, CT 03/11/16:diffuse adenopathy in mediastinal and abdominal areas, LN biopsy 03/12/16-granulomatous lymphadenitis.  Patient had CT chest on March 07, 2023 which was reviewed by Dr. Geronimo in August 2024.  I reviewed the office visit note.  He did not recommend immunosuppressive therapy advised patient to come back in 1 year for a follow-up visit.  I advised patient to schedule a follow-up appointment with Dr. Geronimo.  No rash or synovitis was noted on the examination.  Lungs were clear to auscultation.  I advised him to contact me if he develops any new symptoms.  IMPRESSION: 1. Small to borderline enlarged mediastinal and upper abdominal lymph nodes, compatible with the provided history of sarcoid. No pulmonary parenchymal involvement. 2. Minimal air trapping, indicative of small airways disease.  Electronically Signed   By: Newell Eke M.D.   On: 03/07/2023 08:19  High risk medication use - d/c MTX due to elevated creatinine and AST in March 2020.  Bilateral bunions-currently not symptomatic.  Renal cell carcinoma of left kidney (HCC) - patient had partial nephrectomy in 2018.  He has recurrence of renal cancer.  He underwent total nephrectomy in June 2024.  Patient is followed by urology and nephrology.  History of left nephrectomy  History of renal calculi - last lithotripsy was in November 2023.  He had no recurrence of renal calculi.  Chronic kidney disease (CKD) stage G3a - Patient has been followed at [facility].  According to him his renal function has been stable.  Patient states he had labs  drawn this week.  History of hypertension-blood pressure was normal at 123/81.  History of prostatitis  History of anemia  Orders: No orders of the defined types were placed in this encounter.  No orders of the defined types were placed in this encounter.    Follow-Up Instructions: Return in about 1 year (around 05/02/2025) for Sarcoidosis.   Maya Nash, MD  Note - This record has been created using Animal nutritionist.  Chart creation errors have been sought, but may not always  have been located. Such creation errors do not reflect on  the standard of medical care.

## 2024-05-02 ENCOUNTER — Encounter: Payer: Self-pay | Admitting: Rheumatology

## 2024-05-02 ENCOUNTER — Ambulatory Visit: Payer: Self-pay | Attending: Rheumatology | Admitting: Rheumatology

## 2024-05-02 VITALS — BP 123/81 | HR 61 | Temp 97.9°F | Resp 14 | Ht 69.0 in | Wt 225.8 lb

## 2024-05-02 DIAGNOSIS — N1831 Chronic kidney disease, stage 3a: Secondary | ICD-10-CM | POA: Diagnosis not present

## 2024-05-02 DIAGNOSIS — M21612 Bunion of left foot: Secondary | ICD-10-CM

## 2024-05-02 DIAGNOSIS — Z862 Personal history of diseases of the blood and blood-forming organs and certain disorders involving the immune mechanism: Secondary | ICD-10-CM

## 2024-05-02 DIAGNOSIS — D869 Sarcoidosis, unspecified: Secondary | ICD-10-CM | POA: Diagnosis not present

## 2024-05-02 DIAGNOSIS — Z79899 Other long term (current) drug therapy: Secondary | ICD-10-CM | POA: Diagnosis not present

## 2024-05-02 DIAGNOSIS — Z8679 Personal history of other diseases of the circulatory system: Secondary | ICD-10-CM

## 2024-05-02 DIAGNOSIS — Z87438 Personal history of other diseases of male genital organs: Secondary | ICD-10-CM

## 2024-05-02 DIAGNOSIS — Z905 Acquired absence of kidney: Secondary | ICD-10-CM

## 2024-05-02 DIAGNOSIS — M21611 Bunion of right foot: Secondary | ICD-10-CM | POA: Diagnosis not present

## 2024-05-02 DIAGNOSIS — C642 Malignant neoplasm of left kidney, except renal pelvis: Secondary | ICD-10-CM

## 2024-05-02 DIAGNOSIS — Z87442 Personal history of urinary calculi: Secondary | ICD-10-CM

## 2024-05-02 NOTE — Patient Instructions (Signed)
 Please schedule a follow-up appointment with Dr. Geronimo at [facility] pulmonary.

## 2024-08-03 ENCOUNTER — Ambulatory Visit: Admitting: Internal Medicine

## 2024-11-26 ENCOUNTER — Ambulatory Visit: Admitting: Internal Medicine

## 2025-05-03 ENCOUNTER — Ambulatory Visit: Admitting: Rheumatology
